# Patient Record
Sex: Female | Born: 1975 | Race: Black or African American | Hispanic: No | Marital: Single | Smoking: Never smoker
Health system: Southern US, Community
[De-identification: ages and names within clinical notes are randomized; demographics above are authoritative.]

## PROBLEM LIST (undated history)

## (undated) DIAGNOSIS — K5909 Other constipation: Secondary | ICD-10-CM

## (undated) DIAGNOSIS — R3915 Urgency of urination: Secondary | ICD-10-CM

## (undated) DIAGNOSIS — F419 Anxiety disorder, unspecified: Secondary | ICD-10-CM

## (undated) DIAGNOSIS — F329 Major depressive disorder, single episode, unspecified: Secondary | ICD-10-CM

## (undated) DIAGNOSIS — Z973 Presence of spectacles and contact lenses: Secondary | ICD-10-CM

## (undated) DIAGNOSIS — Z8679 Personal history of other diseases of the circulatory system: Secondary | ICD-10-CM

## (undated) DIAGNOSIS — G43009 Migraine without aura, not intractable, without status migrainosus: Secondary | ICD-10-CM

## (undated) DIAGNOSIS — F319 Bipolar disorder, unspecified: Secondary | ICD-10-CM

## (undated) DIAGNOSIS — R51 Headache: Secondary | ICD-10-CM

## (undated) DIAGNOSIS — F32A Depression, unspecified: Secondary | ICD-10-CM

## (undated) DIAGNOSIS — K449 Diaphragmatic hernia without obstruction or gangrene: Secondary | ICD-10-CM

## (undated) DIAGNOSIS — F410 Panic disorder [episodic paroxysmal anxiety] without agoraphobia: Secondary | ICD-10-CM

## (undated) DIAGNOSIS — F411 Generalized anxiety disorder: Secondary | ICD-10-CM

## (undated) DIAGNOSIS — N871 Moderate cervical dysplasia: Secondary | ICD-10-CM

## (undated) DIAGNOSIS — R519 Headache, unspecified: Secondary | ICD-10-CM

## (undated) DIAGNOSIS — K219 Gastro-esophageal reflux disease without esophagitis: Secondary | ICD-10-CM

## (undated) DIAGNOSIS — D649 Anemia, unspecified: Secondary | ICD-10-CM

## (undated) DIAGNOSIS — Z862 Personal history of diseases of the blood and blood-forming organs and certain disorders involving the immune mechanism: Secondary | ICD-10-CM

## (undated) HISTORY — PX: CYSTOSCOPY WITH URETHRAL DILATATION: SHX5125

## (undated) HISTORY — DX: Gastro-esophageal reflux disease without esophagitis: K21.9

## (undated) HISTORY — PX: OTHER SURGICAL HISTORY: SHX169

## (undated) HISTORY — PX: CHOLECYSTECTOMY: SHX55

---

## 2000-06-04 HISTORY — PX: LAPAROSCOPIC CHOLECYSTECTOMY: SUR755

## 2000-10-23 ENCOUNTER — Ambulatory Visit (HOSPITAL_COMMUNITY): Admission: RE | Admit: 2000-10-23 | Discharge: 2000-10-23 | Payer: Self-pay | Admitting: Internal Medicine

## 2000-10-23 ENCOUNTER — Encounter: Payer: Self-pay | Admitting: Internal Medicine

## 2000-10-29 ENCOUNTER — Observation Stay (HOSPITAL_COMMUNITY): Admission: RE | Admit: 2000-10-29 | Discharge: 2000-10-30 | Payer: Self-pay | Admitting: Internal Medicine

## 2002-05-23 ENCOUNTER — Encounter: Payer: Self-pay | Admitting: Emergency Medicine

## 2002-05-23 ENCOUNTER — Emergency Department (HOSPITAL_COMMUNITY): Admission: EM | Admit: 2002-05-23 | Discharge: 2002-05-23 | Payer: Self-pay | Admitting: Emergency Medicine

## 2002-06-01 ENCOUNTER — Ambulatory Visit (HOSPITAL_COMMUNITY): Admission: RE | Admit: 2002-06-01 | Discharge: 2002-06-01 | Payer: Self-pay | Admitting: Family Medicine

## 2002-12-10 ENCOUNTER — Ambulatory Visit (HOSPITAL_COMMUNITY): Admission: AD | Admit: 2002-12-10 | Discharge: 2002-12-10 | Payer: Self-pay | Admitting: Obstetrics and Gynecology

## 2002-12-14 ENCOUNTER — Ambulatory Visit (HOSPITAL_COMMUNITY): Admission: AD | Admit: 2002-12-14 | Discharge: 2002-12-14 | Payer: Self-pay | Admitting: Obstetrics & Gynecology

## 2002-12-22 ENCOUNTER — Ambulatory Visit (HOSPITAL_COMMUNITY): Admission: AD | Admit: 2002-12-22 | Discharge: 2002-12-22 | Payer: Self-pay | Admitting: Obstetrics & Gynecology

## 2003-01-28 ENCOUNTER — Inpatient Hospital Stay (HOSPITAL_COMMUNITY): Admission: AD | Admit: 2003-01-28 | Discharge: 2003-01-30 | Payer: Self-pay | Admitting: Obstetrics & Gynecology

## 2003-02-12 ENCOUNTER — Ambulatory Visit (HOSPITAL_COMMUNITY): Admission: RE | Admit: 2003-02-12 | Discharge: 2003-02-12 | Payer: Self-pay | Admitting: Obstetrics & Gynecology

## 2003-02-12 ENCOUNTER — Encounter: Payer: Self-pay | Admitting: Obstetrics & Gynecology

## 2003-12-06 ENCOUNTER — Emergency Department (HOSPITAL_COMMUNITY): Admission: EM | Admit: 2003-12-06 | Discharge: 2003-12-06 | Payer: Self-pay | Admitting: Emergency Medicine

## 2004-06-04 HISTORY — PX: ERCP: SHX60

## 2004-08-11 ENCOUNTER — Ambulatory Visit (HOSPITAL_COMMUNITY): Admission: RE | Admit: 2004-08-11 | Discharge: 2004-08-11 | Payer: Self-pay | Admitting: Family Medicine

## 2004-09-25 ENCOUNTER — Ambulatory Visit (HOSPITAL_COMMUNITY): Admission: RE | Admit: 2004-09-25 | Discharge: 2004-09-25 | Payer: Self-pay | Admitting: Family Medicine

## 2004-10-13 ENCOUNTER — Emergency Department (HOSPITAL_COMMUNITY): Admission: EM | Admit: 2004-10-13 | Discharge: 2004-10-13 | Payer: Self-pay | Admitting: Emergency Medicine

## 2005-04-06 ENCOUNTER — Ambulatory Visit (HOSPITAL_COMMUNITY): Admission: RE | Admit: 2005-04-06 | Discharge: 2005-04-06 | Payer: Self-pay | Admitting: Family Medicine

## 2005-04-22 ENCOUNTER — Emergency Department (HOSPITAL_COMMUNITY): Admission: EM | Admit: 2005-04-22 | Discharge: 2005-04-22 | Payer: Self-pay | Admitting: Emergency Medicine

## 2005-05-21 ENCOUNTER — Ambulatory Visit: Payer: Self-pay | Admitting: Internal Medicine

## 2005-05-24 ENCOUNTER — Ambulatory Visit (HOSPITAL_COMMUNITY): Admission: RE | Admit: 2005-05-24 | Discharge: 2005-05-24 | Payer: Self-pay | Admitting: Internal Medicine

## 2005-07-16 ENCOUNTER — Ambulatory Visit: Payer: Self-pay | Admitting: Internal Medicine

## 2005-09-17 ENCOUNTER — Emergency Department (HOSPITAL_COMMUNITY): Admission: EM | Admit: 2005-09-17 | Discharge: 2005-09-17 | Payer: Self-pay | Admitting: Emergency Medicine

## 2005-10-08 ENCOUNTER — Ambulatory Visit: Payer: Self-pay | Admitting: Internal Medicine

## 2005-12-13 ENCOUNTER — Ambulatory Visit: Payer: Self-pay | Admitting: Gastroenterology

## 2006-01-17 ENCOUNTER — Ambulatory Visit (HOSPITAL_COMMUNITY): Admission: RE | Admit: 2006-01-17 | Discharge: 2006-01-17 | Payer: Self-pay | Admitting: Family Medicine

## 2008-04-17 ENCOUNTER — Ambulatory Visit (HOSPITAL_COMMUNITY): Admission: RE | Admit: 2008-04-17 | Discharge: 2008-04-17 | Payer: Self-pay | Admitting: Family Medicine

## 2008-07-19 ENCOUNTER — Other Ambulatory Visit: Admission: RE | Admit: 2008-07-19 | Discharge: 2008-07-19 | Payer: Self-pay | Admitting: Obstetrics and Gynecology

## 2008-07-29 ENCOUNTER — Ambulatory Visit (HOSPITAL_COMMUNITY): Admission: RE | Admit: 2008-07-29 | Discharge: 2008-07-29 | Payer: Self-pay | Admitting: Family Medicine

## 2010-04-11 ENCOUNTER — Ambulatory Visit (HOSPITAL_COMMUNITY)
Admission: RE | Admit: 2010-04-11 | Discharge: 2010-04-11 | Payer: Self-pay | Source: Home / Self Care | Admitting: Gastroenterology

## 2010-04-20 ENCOUNTER — Encounter (HOSPITAL_COMMUNITY)
Admission: RE | Admit: 2010-04-20 | Discharge: 2010-05-20 | Payer: Self-pay | Source: Home / Self Care | Attending: Neurology | Admitting: Neurology

## 2010-08-03 ENCOUNTER — Other Ambulatory Visit: Payer: Self-pay | Admitting: Urology

## 2010-08-03 ENCOUNTER — Encounter (HOSPITAL_COMMUNITY): Payer: Self-pay | Attending: Urology

## 2010-08-03 DIAGNOSIS — Z01812 Encounter for preprocedural laboratory examination: Secondary | ICD-10-CM | POA: Insufficient documentation

## 2010-08-03 DIAGNOSIS — Z01810 Encounter for preprocedural cardiovascular examination: Secondary | ICD-10-CM | POA: Insufficient documentation

## 2010-08-03 LAB — BASIC METABOLIC PANEL WITH GFR
BUN: 6 mg/dL (ref 6–23)
CO2: 28 meq/L (ref 19–32)
Calcium: 9.2 mg/dL (ref 8.4–10.5)
Chloride: 101 meq/L (ref 96–112)
Creatinine, Ser: 0.64 mg/dL (ref 0.4–1.2)
GFR calc non Af Amer: >60 mL/min
Glucose, Bld: 91 mg/dL (ref 70–99)
Potassium: 4 meq/L (ref 3.5–5.1)
Sodium: 137 meq/L (ref 135–145)

## 2010-08-03 LAB — HEMOGLOBIN AND HEMATOCRIT, BLOOD
HCT: 40.2 % (ref 36.0–46.0)
Hemoglobin: 13.2 g/dL (ref 12.0–15.0)

## 2010-08-03 LAB — HCG, QUANTITATIVE, PREGNANCY

## 2010-08-03 LAB — SURGICAL PCR SCREEN
MRSA, PCR: NEGATIVE
Staphylococcus aureus: POSITIVE — AB

## 2010-08-08 ENCOUNTER — Ambulatory Visit (HOSPITAL_COMMUNITY)
Admission: RE | Admit: 2010-08-08 | Discharge: 2010-08-08 | Disposition: A | Discharge status: Home / Self Care | Payer: Medicaid Other | Source: Ambulatory Visit | Attending: Urology | Admitting: Urology

## 2010-08-08 DIAGNOSIS — N393 Stress incontinence (female) (male): Secondary | ICD-10-CM | POA: Insufficient documentation

## 2010-08-08 HISTORY — PX: URETHRAL SLING: SHX2621

## 2010-08-11 NOTE — Op Note (Signed)
NAME:  Shannon Gould, Shannon Gould NO.:  [id number]  MEDICAL RECORD NO.:  [id number]           PATIENT TYPE:  O  LOCATION:  DAYP                          FACILITY:  [facility]  PHYSICIAN:  Ky Barban, M.D.DATE OF BIRTH:  [birth year]  DATE OF PROCEDURE: DATE OF DISCHARGE:                              OPERATIVE REPORT   PREOPERATIVE DIAGNOSIS:  Stress urinary incontinence.  POSTOPERATIVE DIAGNOSIS:  Stress urinary incontinence.  PROCEDURE:  Tension-free vaginal tape.  ANESTHESIA:  General.  PROCEDURE:  The patient under general anesthesia in lithotomy position, after usual prep and drape #20 Foley catheter was reintroduced into the bladder, suprapubic area was marked at the level of the pubic tubercles on both sides of the midline.  Now, a vaginal speculum Sims was applied and the mid urethra and periurethral area was infiltrated with 10 mL of normal saline, and incision about 1 cm long on the midline right over the urethra was made in the middle between the bladder neck and the meatus and with scissor, I was able to develop periurethral area just enough to accept the tip of the needle on both sides of the urethra. Once this was done, then Foley catheter was removed and a catheter with a stylet was introduced into the bladder, deflected the bladder neck to the right side, and introduced a needle through the vaginal incision into the periurethral site in the left side and felt the back of the pubic tubercle, I introduced the needle pushed it all the way up into the suprapubic area in the retropubic space coming out at the previously marked site.  Now, the needle was left in place and the was catheter was removed.  Bladder was inspected with the right angle lens to make sure the needle was outside the bladder.  It was outside then the needle was pulled up into the suprapubic area along with the tip of the tape along with the back of the needle was attached to the tape.   After removing the handle and the tape and was pulled up into the suprapubic area. Similarly, left end of the tape was pulled up into the suprapubic area. Now, the tape is in place.  I have inspected again the bladder with right angle lens and put about 300 mL of saline in the bladder, to make sure the tape is outside the bladder.  Once the tape was in the proper place, the tension on the table was adjusted.  I inserted #20 Foley catheter again in the bladder with my scissors between the tape and the urethra.  Both ends of the tape are pulled and the suprapubic area removed any redundancy.  The tension on the tape was adjusted, it was basically loose, and the plastic sheathing from the tape was removed. Both the plastic sheathing 1 x 1 were taken off.  The vaginal incision is irrigated with saline.  The tape was lying gently against the urethra.  The redundant part of the tape in the suprapubic area was cut flush with the skin.  The vaginal incision was closed with a couple of stitches of 3-0 Vicryl.  Foley  catheter was removed.  The suprapubic incisions were simply closed with Dermabond. The patient left the operating room in satisfactory condition.     Ky Barban, M.D.     MIJ/MEDQ  D:  08/08/2010  T:  08/09/2010  Job:  [id number]  Electronically Signed by Alleen Borne M.D. on 08/10/2010 02:15:18 PM

## 2010-08-11 NOTE — H&P (Signed)
  NAMEJULIETH, Shannon Gould NO.:  [id number]  MEDICAL RECORD NO.:  [id number]           PATIENT TYPE:  LOCATION:                                 FACILITY:  PHYSICIAN:  Ky Barban, M.D.DATE OF BIRTH:  May 05, 1976  DATE OF ADMISSION: DATE OF DISCHARGE:  LH                             HISTORY & PHYSICAL   CHIEF COMPLAINT:  Stress urinary incontinence.  HISTORY:  This 35 year old female is being followed by me since November 2011 after she was referred from the [facility].  She has stress urinary incontinence.  Further workup, cysto/CMG showed that she has marked stress incontinence which goes away with the elevation of the bladder neck, so I have recommended that she undergo tension-free vaginal tape, for which she is coming as an outpatient in the morning, will undergo the procedure.  I have discussed in detail the nature of the procedure.  Possible complications include prolonged retention and it may lead to removal of the tape which means that she will land up having incontinence again.  It will be done as outpatient.  I have explained this thoroughly to her.  She understands and wants me to go ahead and schedule it.     Ky Barban, M.D.     MIJ/MEDQ  D:  08/07/2010  T:  08/08/2010  Job:  [id number]  Electronically Signed by Alleen Borne M.D. on 08/10/2010 02:15:15 PM

## 2010-10-20 NOTE — Procedures (Signed)
   NAME:  [name], Shannon Gould NO.:  [id number]   MEDICAL RECORD NO.:  [id number]                   PATIENT TYPE:  EMS   LOCATION:  ED                                   FACILITY:  [facility]   PHYSICIAN:  Edward L. Juanetta Gosling, M.D.             DATE OF BIRTH:  Jun 11, 1975   DATE OF PROCEDURE:  DATE OF DISCHARGE:  05/23/2002                                EKG INTERPRETATION   TIME AND DATE:  0117 on 05/23/02   INTERPRETATION:  The rhythm is sinus rhythm with a rate of 80.   IMPRESSION:  Normal electrocardiogram.                                               Edward L. Juanetta Gosling, M.D.    ELH/MEDQ  D:  05/25/2002  T:  05/26/2002  Job:  [id number]

## 2010-10-20 NOTE — H&P (Signed)
   NAME:  Shannon Gould, HUKILL NO.:  [id number]   MEDICAL RECORD NO.:  [id number]                   PATIENT TYPE:  INP   LOCATION:  LDR1                                 FACILITY:  [facility]   PHYSICIAN:  Tilda Burrow, M.D.              DATE OF BIRTH:  May 22, 1976   DATE OF ADMISSION:  01/28/2003  DATE OF DISCHARGE:                                HISTORY & PHYSICAL   CHIEF COMPLAINT:  Contractions since 0500 hours.   HISTORY OF PRESENT ILLNESS:  Shannon Gould is a 35 year old gravida 4, para 2,  with an EDC of February 11, 2003 based on a first and second trimester  ultrasound placing her at [redacted] weeks gestation.  Pregnancy has been  complicated by some preterm contractions since approximately 30 weeks for  which she has taken Brethine and had an unresponsive cervix.   PRENATAL LABORATORIES INCLUDE:  Blood type O positive.  Rubella is immune.  RPR/HIV/HBsAg/gonorrhea/Chlamydia/GBS were all negative.  One-hour GTT was  normal at 43.  She did have a positive antibody screen with an anti-M titer  of 1:1.  Blood pressures have been 110s-130s/60s-80s.  Total weight gain  about 25 pounds with appropriate fundal height growth.   PAST MEDICAL HISTORY:  Positive for claustrophobia and anxiety attacks.   PAST SURGICAL HISTORY:  Cholecystectomy in April 2002.  No history of blood  transfusions or anesthesia complications.   ALLERGIES:  HYDROCODONE.   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   FAMILY HISTORY:  Positive for cancer, asthma, coronary artery disease, and  hypertension.   OBSTETRICAL HISTORY:  Had an SAB in 1999, spontaneous vaginal delivery of a  7-pound 4-ounce female in 2000 at 40 weeks and a spontaneous vaginal delivery  of a 6-pound 15-ounce female in 2003 at 33 weeks, she did have a 3-hour labor  with that baby.   PHYSICAL EXAMINATION:  HEENT:  Within normal limits.  HEART:  Regular rate and rhythm.  LUNGS:  Clear.  ABDOMEN:  Soft, nontender, having strong contractions now every few minutes,  fetal heart rate is within normal limits.  PELVIC:  Exam upon arrival to the unit was 2, 90, -1.  She is now 5, 100%, -  1 with bulging membranes.  LEGS:  Negative.   IMPRESSION:  1. Intrauterine pregnancy at 38 weeks.  2. Active labor.   PLAN:  The patient desires epidural anesthesia at this time.  Dr. Emelda Fear  is aware.  Anticipate spontaneous vaginal delivery.     Jacklyn Shell, C.N.M.          Tilda Burrow, M.D.    FC/MEDQ  D:  01/28/2003  T:  01/28/2003  Job:  [id number]   cc:   [facility]

## 2010-10-20 NOTE — Group Therapy Note (Signed)
Shannon Gould, Shannon Gould NO.:  [id number]   MEDICAL RECORD NO.:  [id number]          PATIENT TYPE:  EMS   LOCATION:  ED                            FACILITY:  [facility]   PHYSICIAN:  Kassie Mends, M.D.      DATE OF BIRTH:  1976/02/16   DATE OF PROCEDURE:  12/13/2005  DATE OF DISCHARGE:  09/17/2005                                   PROGRESS NOTE   CHIEF COMPLAINT:  Right upper quadrant abdominal pain.   SUBJECTIVE:  Shannon Gould is a 35 year old female who has a significant past  medical history of persistent right upper quadrant pain.  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  Biliary sphincterotomy was performed.  She  has had a normal ultrasound and MRCP in 2006.  She reports that walking at  the mall causes the right upper quadrant pain to occur after the fourth lap  around the mall.  She desires to do 6 laps.  The pain is sharp, always in  the same place, and does not radiate.  She did have 1 episode of nausea on  Sunday, which she treated with Phenergan and it resolved.  She had  constipation a couple of weeks which resolved with a laxative.  She denies  any vomiting, fever, jaundice, or difficulty swallowing.  It is a 2 out of  10 today.  She does not feel the pain on a daily basis.  She does regularly  pick up, pull, and push patients.  This pain is different than in March  2007.   MEDICATIONS:  1.  Allegra.  2.  Tylenol 1-2 per week, which helps with the pain.  3.  Pamine Forte if she has pain but it does not help.  4.  She has been off Zegerid for at least 2 weeks, and it did not help with      the pain either.   OBJECTIVE:  VITAL SIGNS:  Weight 178 pounds, height 5 feet 4 inches.  Body  mass index 30.6.  Blood pressure 122/70, pulse 62, temperature 98.3.GENERAL:  She is in no apparent distress, alert and oriented x 4.LUNGS:  Clear to  auscultation bilaterally.  CARDIOVASCULAR  EXAM:  Regular rhythm.  No murmur.  Normal S1 and S2.ABDOMEN:  Bowel sounds are present.  Soft, nontender, and nondistended.  No rebound or  guarding.  No reproducible right upper quadrant pain with Carnett's  maneuvers.   REVIEW OF SYSTEMS:  Last menstrual period last week.   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  Thank you for  allowing me to see Shannon Gould in consultation.  I recommend ibuprofen 400 mg  every 8 hours for 10 days.  She may repeat this for another 10 days if the  pain is not completely resolved.  She is to use Tylenol and only Tylenol for  pain relief.  She is to call me in 3 weeks to let me know  how she is doing.  She is  given 20 Protonix to take with the ibuprofen for GI prophylaxis.  She is  asked to use heat 3 times a day on her right upper quadrant to assist with  healing.  She may follow up in 3 months.  If she does not have significant  relief of her pain after 1 course of ibuprofen, she may need to be on work  restriction.      Kassie Mends, M.D.  Electronically Signed     SM/MEDQ  D:  12/13/2005  T:  12/13/2005  Job:  [id number]   cc:   Corrie Mckusick, M.D.  Fax: [phone]

## 2010-10-20 NOTE — Op Note (Signed)
   NAME:  Shannon Gould, Shannon Gould NO.:  [id number]   MEDICAL RECORD NO.:  [id number]                   PATIENT TYPE:  INP   LOCATION:  LDR1                                 FACILITY:  [facility]   PHYSICIAN:  Tilda Burrow, M.D.              DATE OF BIRTH:  [birth year]   DATE OF PROCEDURE:  01/28/2003  DATE OF DISCHARGE:                                 OPERATIVE REPORT   PROCEDURE:  Epidural catheter placement at 12:30 p.m. on August 26.   SURGEON:  Tilda Burrow, M.D.   DESCRIPTION OF PROCEDURE:  Continuous lumbar epidural catheter placement  using loss of resistance technique performed at L2-3 interspace on the first  attempt; at a depth of approximately 5-6 cm beneath the skin.  A test dose  of 1.5% Xylocaine with epinephrine was instilled followed by introduction of  the epidural catheter; aspiration confirmation of no bloody return.  This  was followed by removal of the Touhy needle taping to the back and  attachment to the continuous infusion to run at 12 cc/h.  A 5 cc bolus of  the 0.125% Marcaine solution was administered and the patient showed  satisfactory analgesic relief.  The dermatome level was still setting up at  this time.  Vaginal exam shows the cervix to be 9 cm dilated with huge  bulging forewaters which were broken to reveal clear fluid.  The patient is  tolerating labor dramatically better.                                               Tilda Burrow, M.D.    JVF/MEDQ  D:  01/28/2003  T:  01/28/2003  Job:  [id number]

## 2010-10-20 NOTE — Op Note (Signed)
   NAME:  Shannon Gould, Shannon Gould NO.:  [id number]   MEDICAL RECORD NO.:  [id number]                   PATIENT TYPE:  INP   LOCATION:  LDR1                                 FACILITY:  [facility]   PHYSICIAN:  Jacklyn Shell, C.N.M.    DATE OF BIRTH:  [birth year]   DATE OF PROCEDURE:  DATE OF DISCHARGE:                                 OPERATIVE REPORT   DELIVERY NOTE   Shannon Gould received her epidural.  Membranes were ruptured by Dr. Emelda Fear and  she was noted to be 9 cm at approximately 1240.  Shortly after that she  complained of some perineal pressure with contractions and was noted to be  fully dilated at +2 station.  Shannon Gould had a 2-contraction second stage and  delivered a viable female infant at 17.  The mouth and nose were suctioned  don the perineum and the body delivered easily after that.  Apgars are 7 and  9.  Weight 7 pounds 2 ounces.   Twenty units of Pitocin diluted in 1000 cc of lactated Ringers was then  infused rapidly IV.  The placenta separated spontaneously and was delivered  by a controlled cord traction at 1315.  It was inspected and appears to be  intact with a 3-vessel cord.  The fundus was firm and hardly any blood loss  was noted.  The vagina was then inspected and no lacerations were found.  Estimated blood loss 250 cc. The epidural catheter was then removed with the  blue tip visualized as being intact.                                               Jacklyn Shell, C.N.M.    FC/MEDQ  D:  01/28/2003  T:  01/28/2003  Job:  [id number]   cc:   [facility]

## 2010-10-20 NOTE — Procedures (Signed)
   NAME:  Shannon Gould, ADCOX NO.:  [id number]   MEDICAL RECORD NO.:  [id number]                   PATIENT TYPE:  OUT   LOCATION:  RAD                                  FACILITY:  [facility]   PHYSICIAN:  Kennedale Bing, M.D. [facility]           DATE OF BIRTH:  1975-07-05   DATE OF PROCEDURE:  06/01/2002                  AGE:  35  DATE OF DISCHARGE:  06/01/2002                  SEX:  F                                CARDIAC ULTRASOUND   STRESS CARDIAC ULTRASOUND:   CLINICAL INFORMATION:  A 35 year old woman with chest pain.   FINDINGS:  1. An upright treadmill exercise performed to a workload of 10 METS and a     heart rate of 181, 92% of age-predicted maximum.  Exercise discontinued     due to fatigue. No chest pain reported  2. Blood pressure increased where resting values were 185/80 to 165/105 at     peak exercise, a mildly hypertensive response.  3. No arrhythmias noted.  4. EKG: Normal sinus rhythm; nondiagnostic inferior T wave; nonspecific T     wave abnormality.  5. Exercise EKG: No significant change.  6. Baseline echocardiographic images demonstrate normal chamber dimensions.     No significant valvular abnormalities and normal left ventricular size     and function.  7. Post-exercise imaging: Normal increase in contractility in all myocardial     segments.   IMPRESSION:  Negative stress echocardiogram with normal exercise tolerance,  a mildly hypertensive response to exertion and neither electrocardiographic  nor echocardiographic for myocardial ischemia or infarction.  Other findings  as noted.                                               Virgie Bing, M.D. [facility]    RR/MEDQ  D:  06/02/2002  T:  06/03/2002  Job:  [id number]

## 2010-12-25 ENCOUNTER — Emergency Department (HOSPITAL_COMMUNITY)
Admission: EM | Admit: 2010-12-25 | Discharge: 2010-12-26 | Disposition: A | Discharge status: Home / Self Care | Payer: Self-pay | Attending: Emergency Medicine | Admitting: Emergency Medicine

## 2010-12-25 ENCOUNTER — Encounter: Payer: Self-pay | Admitting: *Deleted

## 2010-12-25 DIAGNOSIS — R51 Headache: Secondary | ICD-10-CM | POA: Insufficient documentation

## 2010-12-25 DIAGNOSIS — J45909 Unspecified asthma, uncomplicated: Secondary | ICD-10-CM | POA: Insufficient documentation

## 2010-12-25 MED ORDER — SODIUM CHLORIDE 0.9 % IV BOLUS (SEPSIS)
1000.0000 mL | Freq: Once | INTRAVENOUS | Status: AC
  Administered 2010-12-25: 1000 mL via INTRAVENOUS

## 2010-12-25 MED ORDER — DIPHENHYDRAMINE HCL 50 MG/ML IJ SOLN
25.0000 mg | Freq: Three times a day (TID) | INTRAMUSCULAR | Status: DC | PRN
  Administered 2010-12-25: 25 mg via INTRAVENOUS
  Filled 2010-12-25: qty 1

## 2010-12-25 NOTE — ED Notes (Signed)
Pt c/o migraine and nausea that started 2 days ago and has gotten progressively worse.

## 2010-12-25 NOTE — ED Notes (Signed)
Headache for 2 days.  Alert, no hx of trauma.  Alert, talking, No relief with home meds.

## 2010-12-26 MED ORDER — METOCLOPRAMIDE HCL 5 MG/ML IJ SOLN
INTRAMUSCULAR | Status: AC
  Administered 2010-12-26: 01:00:00
  Filled 2010-12-26: qty 2

## 2010-12-26 MED ORDER — METOCLOPRAMIDE HCL 5 MG/ML IJ SOLN: 10.0000 mg | Freq: Once | INTRAMUSCULAR | Status: DC

## 2010-12-26 MED ORDER — KETOROLAC TROMETHAMINE 30 MG/ML IJ SOLN
INTRAMUSCULAR | Status: AC
  Filled 2010-12-26: qty 1

## 2010-12-26 MED ORDER — KETOROLAC TROMETHAMINE 30 MG/ML IJ SOLN
30.0000 mg | Freq: Once | INTRAMUSCULAR | Status: AC
  Administered 2010-12-26: 30 mg via INTRAVENOUS

## 2010-12-26 MED ORDER — IBUPROFEN 800 MG PO TABS: 800.0000 mg | ORAL_TABLET | Freq: Three times a day (TID) | ORAL | Status: AC

## 2010-12-26 NOTE — ED Provider Notes (Signed)
History     Chief Complaint  Patient presents with  . Migraine  . Nausea   Patient is a 35 y.o. female presenting with headaches. The history is provided by the patient.  Headache  This is a new problem. The current episode started yesterday. The problem occurs constantly. The problem has not changed since onset.The headache is associated with bright light. The pain is located in the right unilateral region. The quality of the pain is described as throbbing. The pain is moderate. The pain does not radiate. Pertinent negatives include no anorexia, no fever, no malaise/fatigue, no chest pressure, no near-syncope, no orthopnea, no syncope, no shortness of breath and no nausea. She has tried NSAIDs for the symptoms.   Has h/o HAs and has not been getting good sleep recently. No weakness or numbness, no neck stiffness, no rash or recent tick exposure.  Past Medical History  Diagnosis Date  . Asthma     Past Surgical History  Procedure Date  . Cholecystectomy   . Transvaginal mesh     Family History  Problem Relation Age of Onset  . Hypertension Mother   . Diabetes Mother     History  Substance Use Topics  . Smoking status: Never Smoker   . Smokeless tobacco: Not on file  . Alcohol Use: No    OB History    Grav Para Term Preterm Abortions TAB SAB Ect Mult Living                  Review of Systems  Constitutional: Negative for fever, chills and malaise/fatigue.  HENT: Negative for neck pain and neck stiffness.   Eyes: Negative for pain.  Respiratory: Negative for shortness of breath.   Cardiovascular: Negative for chest pain, orthopnea, leg swelling, syncope and near-syncope.  Gastrointestinal: Negative for nausea, abdominal pain and anorexia.  Genitourinary: Negative for dysuria.  Musculoskeletal: Negative for back pain.  Skin: Negative for rash.  Neurological: Positive for headaches.  Psychiatric/Behavioral: Negative for hallucinations and confusion.  All other  systems reviewed and are negative.    Physical Exam  BP 108/52  Pulse 88  Temp(Src) 98.8 F (37.1 C) (Oral)  Resp 16  Ht 5\' 4"  (1.626 m)  Wt 225 lb (102.059 kg)  BMI 38.62 kg/m2  SpO2 100%  LMP 12/17/2010  Physical Exam  Constitutional: She is oriented to person, place, and time. She appears well-developed and well-nourished.  HENT:  Head: Normocephalic and atraumatic.  Eyes: Conjunctivae and EOM are normal. Pupils are equal, round, and reactive to light.  Neck: Full passive range of motion without pain. Neck supple. No thyromegaly present.       No meningismus  Cardiovascular: Normal rate, regular rhythm, S1 normal, S2 normal and intact distal pulses.   Pulmonary/Chest: Effort normal and breath sounds normal.  Abdominal: Soft. Bowel sounds are normal. There is no tenderness. There is no CVA tenderness.  Musculoskeletal: Normal range of motion.  Neurological: She is alert and oriented to person, place, and time. She has normal strength and normal reflexes. No cranial nerve deficit or sensory deficit. She displays a negative Romberg sign. GCS eye subscore is 4. GCS verbal subscore is 5. GCS motor subscore is 6.       Normal Gait  Skin: Skin is warm and dry. No rash noted. No cyanosis. Nails show no clubbing.  Psychiatric: She has a normal mood and affect. Her speech is normal and behavior is normal.    ED Course  Procedures PT  given IVFs and HA cocktail. No meningismus or presentation to suggest elevated ICP or SAH.  MDM 1:26 AM recheck: pain improved, remains normal neuro exam, stable for d/c home.       Sunnie Nielsen, MD 12/26/10 [phone]

## 2011-09-17 ENCOUNTER — Other Ambulatory Visit: Payer: Self-pay | Admitting: Adult Health

## 2011-09-17 ENCOUNTER — Other Ambulatory Visit (HOSPITAL_COMMUNITY)
Admission: RE | Admit: 2011-09-17 | Discharge: 2011-09-17 | Disposition: A | Discharge status: Home / Self Care | Payer: Self-pay | Source: Ambulatory Visit | Attending: Obstetrics and Gynecology | Admitting: Obstetrics and Gynecology

## 2011-09-17 DIAGNOSIS — Z01419 Encounter for gynecological examination (general) (routine) without abnormal findings: Secondary | ICD-10-CM | POA: Insufficient documentation

## 2011-09-17 DIAGNOSIS — Z1159 Encounter for screening for other viral diseases: Secondary | ICD-10-CM | POA: Insufficient documentation

## 2014-07-30 ENCOUNTER — Other Ambulatory Visit: Payer: Self-pay

## 2014-08-02 LAB — CYTOLOGY - PAP

## 2014-08-05 ENCOUNTER — Emergency Department (HOSPITAL_COMMUNITY)
Admission: EM | Admit: 2014-08-05 | Discharge: 2014-08-05 | Disposition: A | Discharge status: Home / Self Care | Payer: 59 | Attending: Emergency Medicine | Admitting: Emergency Medicine

## 2014-08-05 ENCOUNTER — Encounter (HOSPITAL_COMMUNITY): Payer: Self-pay | Admitting: Cardiology

## 2014-08-05 ENCOUNTER — Emergency Department (HOSPITAL_COMMUNITY): Payer: 59

## 2014-08-05 DIAGNOSIS — J45909 Unspecified asthma, uncomplicated: Secondary | ICD-10-CM | POA: Insufficient documentation

## 2014-08-05 DIAGNOSIS — R079 Chest pain, unspecified: Secondary | ICD-10-CM | POA: Diagnosis not present

## 2014-08-05 DIAGNOSIS — Z79899 Other long term (current) drug therapy: Secondary | ICD-10-CM | POA: Diagnosis not present

## 2014-08-05 DIAGNOSIS — F329 Major depressive disorder, single episode, unspecified: Secondary | ICD-10-CM | POA: Diagnosis not present

## 2014-08-05 HISTORY — DX: Depression, unspecified: F32.A

## 2014-08-05 HISTORY — DX: Major depressive disorder, single episode, unspecified: F32.9

## 2014-08-05 LAB — COMPREHENSIVE METABOLIC PANEL
ALBUMIN: 4.1 g/dL (ref 3.5–5.2)
ALT: 11 U/L (ref 0–35)
AST: 13 U/L (ref 0–37)
Alkaline Phosphatase: 45 U/L (ref 39–117)
Anion gap: 5 (ref 5–15)
BILIRUBIN TOTAL: 0.7 mg/dL (ref 0.3–1.2)
BUN: 15 mg/dL (ref 6–23)
CHLORIDE: 109 mmol/L (ref 96–112)
CO2: 24 mmol/L (ref 19–32)
CREATININE: 0.71 mg/dL (ref 0.50–1.10)
Calcium: 8.6 mg/dL (ref 8.4–10.5)
GFR calc Af Amer: >90 mL/min (ref >90)
Glucose, Bld: 100 mg/dL — ABNORMAL HIGH (ref 70–99)
POTASSIUM: 4.1 mmol/L (ref 3.5–5.1)
SODIUM: 138 mmol/L (ref 135–145)
Total Protein: 7.2 g/dL (ref 6.0–8.3)

## 2014-08-05 LAB — CBC WITH DIFFERENTIAL/PLATELET
Basophils Absolute: 0 10*3/uL (ref 0.0–0.1)
Basophils Relative: 0 % (ref 0–1)
Eosinophils Absolute: 0.2 10*3/uL (ref 0.0–0.7)
Eosinophils Relative: 2 % (ref 0–5)
HEMATOCRIT: 39.7 % (ref 36.0–46.0)
HEMOGLOBIN: 13.1 g/dL (ref 12.0–15.0)
LYMPHS ABS: 2.1 10*3/uL (ref 0.7–4.0)
Lymphocytes Relative: 27 % (ref 12–46)
MCH: 27.9 pg (ref 26.0–34.0)
MCHC: 33 g/dL (ref 30.0–36.0)
MCV: 84.5 fL (ref 78.0–100.0)
MONO ABS: 0.6 10*3/uL (ref 0.1–1.0)
MONOS PCT: 8 % (ref 3–12)
NEUTROS ABS: 4.8 10*3/uL (ref 1.7–7.7)
NEUTROS PCT: 63 % (ref 43–77)
Platelets: 216 10*3/uL (ref 150–400)
RBC: 4.7 MIL/uL (ref 3.87–5.11)
RDW: 12.3 % (ref 11.5–15.5)
WBC: 7.7 10*3/uL (ref 4.0–10.5)

## 2014-08-05 LAB — TROPONIN I

## 2014-08-05 NOTE — ED Notes (Signed)
Chest pressure and feeling palpitations times 2 days.

## 2014-08-05 NOTE — ED Notes (Signed)
Patient is resting comfortably. 

## 2014-08-05 NOTE — ED Provider Notes (Signed)
CSN: [id number]     Arrival date & time 08/05/14  0724 History  This chart was scribed for Shannon Hutching, MD by Abel Presto, ED Scribe. This patient was seen in room APA07/APA07 and the patient's care was started at 7:52 AM.    Chief Complaint  Patient presents with  . Chest Pain    Patient is a 39 y.o. female presenting with chest pain. The history is provided by the patient. No language interpreter was used.  Chest Pain  HPI Comments: Shannon Gould is a 39 y.o. female who presents to the Emergency Department complaining of waxing and waning central and upper left chest pressure with onset 2 days ago. Pt notes she felt anxious 3 days ago and notes associated SOB. Pt was concerned she was having an anxiety attack. Pt has had recent episodes of palpitations. Pt was seen by PCP this morning and instructed to come to the ED. Pt is on Topomax and Flagyl. Pt is not a smoker, but notes infrequent EtOH use.  Pt denies bcp use, DOE, diaphoresis, vomiting.   Past Medical History  Diagnosis Date  . Asthma   . Depression    Past Surgical History  Procedure Laterality Date  . Cholecystectomy    . Transvaginal mesh     Family History  Problem Relation Age of Onset  . Hypertension Mother   . Diabetes Mother   . Heart disease Father    History  Substance Use Topics  . Smoking status: Never Smoker   . Smokeless tobacco: Not on file  . Alcohol Use: No   OB History    No data available     Review of Systems  Cardiovascular: Positive for chest pain.  A complete 10 system review of systems was obtained and all systems are negative except as noted in the HPI and PMH.      Allergies  Hydrocodone  Home Medications   Prior to Admission medications   Medication Sig Start Date End Date Taking? Authorizing Provider  albuterol (PROVENTIL HFA) 108 (90 BASE) MCG/ACT inhaler Inhale 2 puffs into the lungs every 6 (six) hours as needed. For shortness of breath    Yes Historical Provider, MD   albuterol (PROVENTIL) (2.5 MG/3ML) 0.083% nebulizer solution Take 2.5 mg by nebulization as needed. For wheezing     Yes Historical Provider, MD  levonorgestrel (MIRENA) 20 MCG/24HR IUD 1 each by Intrauterine route once. Inserted almost 2 years ago w/ unknown date    Yes Historical Provider, MD  metroNIDAZOLE (FLAGYL) 500 MG tablet Take 500 mg by mouth 2 (two) times daily.   Yes Historical Provider, MD  tetrahydrozoline 0.05 % ophthalmic solution Place 1 drop into the left eye daily as needed (dryness).   Yes Historical Provider, MD  topiramate (TOPAMAX) 100 MG tablet Take 50 mg by mouth daily.   Yes Historical Provider, MD   BP 114/70 mmHg  Pulse 75  Temp(Src) 98.7 F (37.1 C) (Oral)  Resp 12  Ht  (1.6 m)  Wt 189 lb (85.73 kg)  BMI 33.49 kg/m2  SpO2 100%  LMP 06/16/2014 Physical Exam  Constitutional: She is oriented to person, place, and time. She appears well-developed and well-nourished.  HENT:  Head: Normocephalic and atraumatic.  Eyes: Conjunctivae and EOM are normal. Pupils are equal, round, and reactive to light.  Neck: Normal range of motion. Neck supple.  Cardiovascular: Normal rate and regular rhythm.   Pulmonary/Chest: Effort normal and breath sounds normal.  Abdominal: Soft. Bowel  sounds are normal.  Musculoskeletal: Normal range of motion.  Neurological: She is alert and oriented to person, place, and time.  Skin: Skin is warm and dry.  Psychiatric: She has a normal mood and affect. Her behavior is normal.  Nursing note and vitals reviewed.   ED Course  Procedures (including critical care time) DIAGNOSTIC STUDIES: Oxygen Saturation is 100% on room air, normal by my interpretation.    COORDINATION OF CARE: 7:59 AM Discussed treatment plan with patient at beside, the patient agrees with the plan and has no further questions at this time.   Labs Review Labs Reviewed  COMPREHENSIVE METABOLIC PANEL - Abnormal; Notable for the following:    Glucose, Bld 100  (*)    All other components within normal limits  CBC WITH DIFFERENTIAL/PLATELET  TROPONIN I    Imaging Review Dg Chest 2 View  08/05/2014   CLINICAL DATA:  Three days and mid chest pressure, history of asthma  EXAM: CHEST  2 VIEW  COMPARISON:  PA and lateral chest of July 29, 2008  FINDINGS: The lungs are adequately inflated. There is no focal infiltrate. The interstitial markings are minimally prominent diffusely but stable. There is no pleural effusion, pneumothorax, or pneumomediastinum. The heart and pulmonary vascularity are normal. The mediastinum is normal in width. The bony thorax is unremarkable.  IMPRESSION: There is no active cardiopulmonary disease.   Electronically Signed   By: David  [place]   On: 08/05/2014 08:51     EKG Interpretation   Date/Time:  Thursday August 05 2014 07:34:42 EST Ventricular Rate:  82 PR Interval:  138 QRS Duration: 78 QT Interval:  384 QTC Calculation: 448 R Axis:   73 Text Interpretation:  Sinus rhythm Abnormal R-wave progression, early  transition Confirmed by Christal Lagerstrom  MD, Zyrah Wiswell ([id number]) on 08/05/2014 7:41:19 AM      Results for orders placed or performed during the hospital encounter of 08/05/14  Comprehensive metabolic panel  Result Value Ref Range   Sodium 138 135 - 145 mmol/L   Potassium 4.1 3.5 - 5.1 mmol/L   Chloride 109 96 - 112 mmol/L   CO2 24 19 - 32 mmol/L   Glucose, Bld 100 (H) 70 - 99 mg/dL   BUN 15 6 - 23 mg/dL   Creatinine, Ser 1.91 0.50 - 1.10 mg/dL   Calcium 8.6 8.4 - 47.8 mg/dL   Total Protein 7.2 6.0 - 8.3 g/dL   Albumin 4.1 3.5 - 5.2 g/dL   AST 13 0 - 37 U/L   ALT 11 0 - 35 U/L   Alkaline Phosphatase 45 39 - 117 U/L   Total Bilirubin 0.7 0.3 - 1.2 mg/dL   GFR calc non Af Amer >90 >90 mL/min   GFR calc Af Amer >90 >90 mL/min   Anion gap 5 5 - 15  CBC with Differential/Platelet  Result Value Ref Range   WBC 7.7 4.0 - 10.5 K/uL   RBC 4.70 3.87 - 5.11 MIL/uL   Hemoglobin 13.1 12.0 - 15.0 g/dL   HCT 29.5 62.1 - 30.8  %   MCV 84.5 78.0 - 100.0 fL   MCH 27.9 26.0 - 34.0 pg   MCHC 33.0 30.0 - 36.0 g/dL   RDW 65.7 84.6 - 96.2 %   Platelets 216 150 - 400 K/uL   Neutrophils Relative % 63 43 - 77 %   Neutro Abs 4.8 1.7 - 7.7 K/uL   Lymphocytes Relative 27 12 - 46 %   Lymphs Abs 2.1 0.7 -  4.0 K/uL   Monocytes Relative 8 3 - 12 %   Monocytes Absolute 0.6 0.1 - 1.0 K/uL   Eosinophils Relative 2 0 - 5 %   Eosinophils Absolute 0.2 0.0 - 0.7 K/uL   Basophils Relative 0 0 - 1 %   Basophils Absolute 0.0 0.0 - 0.1 K/uL  Troponin I  Result Value Ref Range   Troponin I <0.03 <0.031 ng/mL       MDM   Final diagnoses:  Chest pain    Patient is extremely low risk for ACS or pulmonary embolism. Screening tests normal. She has primary care follow-up   I personally performed the services described in this documentation, which was scribed in my presence. The recorded information has been reviewed and is accurate.     Shannon HutchingBrian Salimah Martinovich, MD 08/05/14 1105

## 2014-08-05 NOTE — Discharge Instructions (Signed)
Tests were normal. Follow-up your primary care doctor. °

## 2015-01-25 ENCOUNTER — Emergency Department (HOSPITAL_COMMUNITY): Payer: 59

## 2015-01-25 ENCOUNTER — Emergency Department (HOSPITAL_COMMUNITY)
Admission: EM | Admit: 2015-01-25 | Discharge: 2015-01-25 | Disposition: A | Discharge status: Home / Self Care | Payer: 59 | Attending: Emergency Medicine | Admitting: Emergency Medicine

## 2015-01-25 ENCOUNTER — Encounter (HOSPITAL_COMMUNITY): Payer: Self-pay | Admitting: *Deleted

## 2015-01-25 DIAGNOSIS — J45909 Unspecified asthma, uncomplicated: Secondary | ICD-10-CM | POA: Diagnosis not present

## 2015-01-25 DIAGNOSIS — Y92831 Amusement park as the place of occurrence of the external cause: Secondary | ICD-10-CM | POA: Insufficient documentation

## 2015-01-25 DIAGNOSIS — Y998 Other external cause status: Secondary | ICD-10-CM | POA: Insufficient documentation

## 2015-01-25 DIAGNOSIS — Y93I1 Activity, roller coaster riding: Secondary | ICD-10-CM | POA: Insufficient documentation

## 2015-01-25 DIAGNOSIS — G43909 Migraine, unspecified, not intractable, without status migrainosus: Secondary | ICD-10-CM | POA: Insufficient documentation

## 2015-01-25 DIAGNOSIS — W228XXA Striking against or struck by other objects, initial encounter: Secondary | ICD-10-CM | POA: Diagnosis not present

## 2015-01-25 DIAGNOSIS — G43009 Migraine without aura, not intractable, without status migrainosus: Secondary | ICD-10-CM

## 2015-01-25 DIAGNOSIS — Z8659 Personal history of other mental and behavioral disorders: Secondary | ICD-10-CM | POA: Insufficient documentation

## 2015-01-25 DIAGNOSIS — Z79899 Other long term (current) drug therapy: Secondary | ICD-10-CM | POA: Diagnosis not present

## 2015-01-25 DIAGNOSIS — S0990XA Unspecified injury of head, initial encounter: Secondary | ICD-10-CM | POA: Diagnosis present

## 2015-01-25 HISTORY — DX: Headache, unspecified: R51.9

## 2015-01-25 HISTORY — DX: Headache: R51

## 2015-01-25 MED ORDER — METOCLOPRAMIDE HCL 5 MG/ML IJ SOLN
10.0000 mg | Freq: Once | INTRAMUSCULAR | Status: AC
  Administered 2015-01-25: 10 mg via INTRAVENOUS
  Filled 2015-01-25: qty 2

## 2015-01-25 MED ORDER — DIPHENHYDRAMINE HCL 50 MG/ML IJ SOLN
25.0000 mg | Freq: Once | INTRAMUSCULAR | Status: AC
  Administered 2015-01-25: 25 mg via INTRAVENOUS
  Filled 2015-01-25: qty 1

## 2015-01-25 MED ORDER — SODIUM CHLORIDE 0.9 % IV SOLN
1000.0000 mL | Freq: Once | INTRAVENOUS | Status: AC
Start: 2015-01-25 — End: 2015-01-25
  Administered 2015-01-25: 1000 mL via INTRAVENOUS

## 2015-01-25 MED ORDER — SODIUM CHLORIDE 0.9 % IV SOLN
1000.0000 mL | INTRAVENOUS | Status: DC
  Administered 2015-01-25: 1000 mL via INTRAVENOUS
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
## 2015-01-25 NOTE — Discharge Instructions (Signed)

## 2015-01-25 NOTE — ED Notes (Signed)
Pt alert & oriented x4, stable gait. Patient given discharge instructions, paperwork & prescription(s). Patient  instructed to stop at the registration desk to finish any additional paperwork. Patient verbalized understanding. Pt left department w/ no further questions. 

## 2015-06-03 ENCOUNTER — Other Ambulatory Visit: Payer: Self-pay | Admitting: Internal Medicine

## 2015-06-27 DIAGNOSIS — F329 Major depressive disorder, single episode, unspecified: Secondary | ICD-10-CM | POA: Diagnosis not present

## 2015-06-27 MED FILL — SERTRALINE HCL 100 MG TAB: 100 | 90 days supply | Qty: 135 | Fill #0

## 2015-07-07 MED FILL — TOPIRAMATE 100 MG TABLET: 100 | 30 days supply | Qty: 60 | Fill #3

## 2015-07-08 DIAGNOSIS — F329 Major depressive disorder, single episode, unspecified: Secondary | ICD-10-CM | POA: Diagnosis not present

## 2015-07-22 DIAGNOSIS — F329 Major depressive disorder, single episode, unspecified: Secondary | ICD-10-CM | POA: Diagnosis not present

## 2015-07-25 DIAGNOSIS — F329 Major depressive disorder, single episode, unspecified: Secondary | ICD-10-CM | POA: Diagnosis not present

## 2015-08-05 DIAGNOSIS — F329 Major depressive disorder, single episode, unspecified: Secondary | ICD-10-CM | POA: Diagnosis not present

## 2015-08-05 MED FILL — RELPAX 40 MG TABLET: 40 | 30 days supply | Qty: 9 | Fill #2

## 2015-08-05 MED FILL — TOPIRAMATE 100 MG TABLET: 100 | 30 days supply | Qty: 60 | Fill #4

## 2015-08-08 DIAGNOSIS — F329 Major depressive disorder, single episode, unspecified: Secondary | ICD-10-CM | POA: Diagnosis not present

## 2015-08-25 ENCOUNTER — Other Ambulatory Visit: Payer: Self-pay | Admitting: Obstetrics

## 2015-08-25 DIAGNOSIS — Z6829 Body mass index (BMI) 29.0-29.9, adult: Secondary | ICD-10-CM | POA: Diagnosis not present

## 2015-08-25 DIAGNOSIS — F329 Major depressive disorder, single episode, unspecified: Secondary | ICD-10-CM | POA: Diagnosis not present

## 2015-08-25 DIAGNOSIS — Z202 Contact with and (suspected) exposure to infections with a predominantly sexual mode of transmission: Secondary | ICD-10-CM | POA: Diagnosis not present

## 2015-08-25 DIAGNOSIS — Z113 Encounter for screening for infections with a predominantly sexual mode of transmission: Secondary | ICD-10-CM | POA: Diagnosis not present

## 2015-08-25 DIAGNOSIS — Z01419 Encounter for gynecological examination (general) (routine) without abnormal findings: Secondary | ICD-10-CM | POA: Diagnosis not present

## 2015-08-25 DIAGNOSIS — N760 Acute vaginitis: Secondary | ICD-10-CM | POA: Diagnosis not present

## 2015-08-25 MED FILL — metroNIDAZOLE 500 MG TABS: 500 | 7 days supply | Qty: 14 | Fill #0

## 2015-08-29 DIAGNOSIS — F329 Major depressive disorder, single episode, unspecified: Secondary | ICD-10-CM | POA: Diagnosis not present

## 2015-09-02 MED FILL — FLUCONAZOLE 150 MG TABLET: 150 | 3 days supply | Qty: 2 | Fill #0

## 2015-09-05 MED FILL — TOPIRAMATE 100 MG TABLET: 100 | 30 days supply | Qty: 60 | Fill #5

## 2015-09-12 DIAGNOSIS — F329 Major depressive disorder, single episode, unspecified: Secondary | ICD-10-CM | POA: Diagnosis not present

## 2015-09-14 MED FILL — ZOLPIDEM TARTRATE 10 MG TAB: 10 | 30 days supply | Qty: 30 | Fill #0

## 2015-09-26 DIAGNOSIS — F329 Major depressive disorder, single episode, unspecified: Secondary | ICD-10-CM | POA: Diagnosis not present

## 2015-09-26 MED FILL — SERTRALINE HCL 100 MG TAB: 100 | 90 days supply | Qty: 180 | Fill #0

## 2015-10-28 MED FILL — TOPIRAMATE 100 MG TABLET: 100 | 30 days supply | Qty: 60 | Fill #6

## 2015-11-01 MED FILL — ALPRAZolam 0.5 MG TABS: 0.5 | 90 days supply | Qty: 180 | Fill #0

## 2015-11-18 MED FILL — ZOLPIDEM TARTRATE 10 MG TAB: 10 | 90 days supply | Qty: 90 | Fill #0

## 2015-12-01 DIAGNOSIS — F329 Major depressive disorder, single episode, unspecified: Secondary | ICD-10-CM | POA: Diagnosis not present

## 2015-12-19 DIAGNOSIS — F329 Major depressive disorder, single episode, unspecified: Secondary | ICD-10-CM | POA: Diagnosis not present

## 2016-01-10 MED FILL — SERTRALINE HCL 100 MG TAB: 100 | 90 days supply | Qty: 180 | Fill #0

## 2016-01-10 MED FILL — TOPIRAMATE 100 MG TABLET: 100 | 30 days supply | Qty: 60 | Fill #7

## 2016-01-10 MED FILL — ELETRIPTAN HBR 40 MG TABLET: 40 | 30 days supply | Qty: 9 | Fill #3

## 2016-03-13 MED FILL — TOPIRAMATE 100 MG TABLET: 100 | 30 days supply | Qty: 60 | Fill #0

## 2016-03-13 MED FILL — ELETRIPTAN HBR 40 MG TABLET: 40 | 30 days supply | Qty: 9 | Fill #0

## 2016-03-13 MED FILL — VENTOLIN HFA 90 MCG INHALER: 108 (90 BAS | 17 days supply | Qty: 18 | Fill #0

## 2016-03-16 DIAGNOSIS — M7711 Lateral epicondylitis, right elbow: Secondary | ICD-10-CM | POA: Diagnosis not present

## 2016-03-16 DIAGNOSIS — Z1389 Encounter for screening for other disorder: Secondary | ICD-10-CM | POA: Diagnosis not present

## 2016-03-16 DIAGNOSIS — G43109 Migraine with aura, not intractable, without status migrainosus: Secondary | ICD-10-CM | POA: Diagnosis not present

## 2016-03-16 DIAGNOSIS — Z6830 Body mass index (BMI) 30.0-30.9, adult: Secondary | ICD-10-CM | POA: Diagnosis not present

## 2016-03-16 MED FILL — ONDANSETRON ODT 4 MG TABLET: 4 | 10 days supply | Qty: 30 | Fill #0

## 2016-03-21 DIAGNOSIS — G5601 Carpal tunnel syndrome, right upper limb: Secondary | ICD-10-CM | POA: Diagnosis not present

## 2016-04-11 MED FILL — SERTRALINE HCL 100 MG TAB: 100 | 90 days supply | Qty: 180 | Fill #1

## 2016-04-19 MED FILL — TOPIRAMATE 100 MG TABLET: 100 | 30 days supply | Qty: 60 | Fill #0

## 2016-06-06 MED FILL — TOPIRAMATE 100 MG TABLET: 100 | 30 days supply | Qty: 60 | Fill #1

## 2016-06-06 MED FILL — ELETRIPTAN HBR 40 MG TABLET: 40 | 30 days supply | Qty: 9 | Fill #0

## 2016-07-16 MED FILL — SERTRALINE HCL 100 MG TAB: 100 | 90 days supply | Qty: 180 | Fill #0

## 2016-07-20 DIAGNOSIS — F329 Major depressive disorder, single episode, unspecified: Secondary | ICD-10-CM | POA: Diagnosis not present

## 2016-07-29 MED FILL — TOPIRAMATE 100 MG TABLET: 100 | 30 days supply | Qty: 60 | Fill #2

## 2016-09-20 MED FILL — TOPIRAMATE 100 MG TABLET: 100 | 30 days supply | Qty: 60 | Fill #3

## 2016-10-05 MED FILL — ALPRAZolam 0.5 MG TABS: 0.5 | 90 days supply | Qty: 180 | Fill #0

## 2016-10-05 MED FILL — SERTRALINE HCL 100 MG TAB: 100 | 90 days supply | Qty: 210 | Fill #0

## 2016-10-05 MED FILL — ZOLPIDEM TARTRATE 10 MG TAB: 10 | 90 days supply | Qty: 90 | Fill #0

## 2016-10-25 MED FILL — BUPROPION HCL XL 150 MG TAB: 150 | 30 days supply | Qty: 30 | Fill #0

## 2016-10-30 ENCOUNTER — Encounter (HOSPITAL_COMMUNITY): Payer: Self-pay | Admitting: *Deleted

## 2016-10-30 ENCOUNTER — Emergency Department (HOSPITAL_COMMUNITY)
Admission: EM | Admit: 2016-10-30 | Discharge: 2016-10-31 | Disposition: A | Discharge status: Other Acute Inpatient Hospital | Payer: 59 | Attending: Emergency Medicine | Admitting: Emergency Medicine

## 2016-10-30 DIAGNOSIS — J45909 Unspecified asthma, uncomplicated: Secondary | ICD-10-CM | POA: Insufficient documentation

## 2016-10-30 DIAGNOSIS — F332 Major depressive disorder, recurrent severe without psychotic features: Secondary | ICD-10-CM | POA: Diagnosis present

## 2016-10-30 DIAGNOSIS — F329 Major depressive disorder, single episode, unspecified: Secondary | ICD-10-CM | POA: Diagnosis not present

## 2016-10-30 DIAGNOSIS — Z79899 Other long term (current) drug therapy: Secondary | ICD-10-CM | POA: Insufficient documentation

## 2016-10-30 DIAGNOSIS — R45851 Suicidal ideations: Secondary | ICD-10-CM | POA: Insufficient documentation

## 2016-10-30 NOTE — ED Triage Notes (Signed)
Pt stated "I saw my therapist today @ [facility].  She said she would let me go home because I told her I had school work to do but I had to promise I would come to the hospital if my thoughts got any worse."

## 2016-10-31 ENCOUNTER — Encounter (HOSPITAL_COMMUNITY): Payer: Self-pay | Admitting: General Practice

## 2016-10-31 ENCOUNTER — Inpatient Hospital Stay (HOSPITAL_COMMUNITY)
Admission: AD | Admit: 2016-10-31 | Discharge: 2016-11-02 | DRG: 885 | Disposition: A | Discharge status: Home / Self Care | Payer: 59 | Source: Intra-hospital | Attending: Psychiatry | Admitting: Psychiatry

## 2016-10-31 DIAGNOSIS — E739 Lactose intolerance, unspecified: Secondary | ICD-10-CM | POA: Diagnosis present

## 2016-10-31 DIAGNOSIS — Z91018 Allergy to other foods: Secondary | ICD-10-CM

## 2016-10-31 DIAGNOSIS — Z818 Family history of other mental and behavioral disorders: Secondary | ICD-10-CM | POA: Diagnosis not present

## 2016-10-31 DIAGNOSIS — F419 Anxiety disorder, unspecified: Secondary | ICD-10-CM | POA: Diagnosis present

## 2016-10-31 DIAGNOSIS — F332 Major depressive disorder, recurrent severe without psychotic features: Principal | ICD-10-CM | POA: Diagnosis present

## 2016-10-31 DIAGNOSIS — R45851 Suicidal ideations: Secondary | ICD-10-CM | POA: Diagnosis present

## 2016-10-31 DIAGNOSIS — J45909 Unspecified asthma, uncomplicated: Secondary | ICD-10-CM | POA: Diagnosis present

## 2016-10-31 DIAGNOSIS — Z79899 Other long term (current) drug therapy: Secondary | ICD-10-CM | POA: Diagnosis not present

## 2016-10-31 DIAGNOSIS — Z915 Personal history of self-harm: Secondary | ICD-10-CM | POA: Diagnosis not present

## 2016-10-31 DIAGNOSIS — Z793 Long term (current) use of hormonal contraceptives: Secondary | ICD-10-CM | POA: Diagnosis not present

## 2016-10-31 DIAGNOSIS — Z885 Allergy status to narcotic agent status: Secondary | ICD-10-CM

## 2016-10-31 DIAGNOSIS — F5105 Insomnia due to other mental disorder: Secondary | ICD-10-CM | POA: Diagnosis present

## 2016-10-31 HISTORY — DX: Anemia, unspecified: D64.9

## 2016-10-31 HISTORY — DX: Anxiety disorder, unspecified: F41.9

## 2016-10-31 LAB — COMPREHENSIVE METABOLIC PANEL
ALK PHOS: 51 U/L (ref 38–126)
ALT: 13 U/L — ABNORMAL LOW (ref 14–54)
ANION GAP: 7 (ref 5–15)
AST: 17 U/L (ref 15–41)
Albumin: 4.2 g/dL (ref 3.5–5.0)
BILIRUBIN TOTAL: 0.4 mg/dL (ref 0.3–1.2)
BUN: 13 mg/dL (ref 6–20)
CALCIUM: 9.1 mg/dL (ref 8.9–10.3)
CO2: 24 mmol/L (ref 22–32)
Chloride: 110 mmol/L (ref 101–111)
Creatinine, Ser: 0.82 mg/dL (ref 0.44–1.00)
Glucose, Bld: 93 mg/dL (ref 65–99)
Potassium: 3.5 mmol/L (ref 3.5–5.1)
Sodium: 141 mmol/L (ref 135–145)
TOTAL PROTEIN: 7.4 g/dL (ref 6.5–8.1)

## 2016-10-31 LAB — RAPID URINE DRUG SCREEN, HOSP PERFORMED
Amphetamines: NOT DETECTED
Barbiturates: NOT DETECTED
Benzodiazepines: POSITIVE — AB
COCAINE: NOT DETECTED
OPIATES: NOT DETECTED
Tetrahydrocannabinol: NOT DETECTED

## 2016-10-31 LAB — CBC
HCT: 40.7 % (ref 36.0–46.0)
Hemoglobin: 13.3 g/dL (ref 12.0–15.0)
MCH: 27.9 pg (ref 26.0–34.0)
MCHC: 32.7 g/dL (ref 30.0–36.0)
MCV: 85.5 fL (ref 78.0–100.0)
PLATELETS: 227 10*3/uL (ref 150–400)
RBC: 4.76 MIL/uL (ref 3.87–5.11)
RDW: 12.7 % (ref 11.5–15.5)
WBC: 8.3 10*3/uL (ref 4.0–10.5)

## 2016-10-31 LAB — ACETAMINOPHEN LEVEL: Acetaminophen (Tylenol), Serum: <10 ug/mL — ABNORMAL LOW (ref 10–30)

## 2016-10-31 LAB — ETHANOL

## 2016-10-31 LAB — SALICYLATE LEVEL

## 2016-10-31 LAB — PREGNANCY, URINE: Preg Test, Ur: NEGATIVE

## 2016-10-31 MED ORDER — BUPROPION HCL ER (XL) 150 MG PO TB24
150.0000 mg | ORAL_TABLET | Freq: Every morning | ORAL | Status: DC
  Filled 2016-10-31 (×2): qty 1

## 2016-10-31 MED ORDER — SERTRALINE HCL 50 MG PO TABS: 200.0000 mg | ORAL_TABLET | Freq: Every day | ORAL | Status: DC

## 2016-10-31 MED ORDER — LORAZEPAM 0.5 MG PO TABS
0.5000 mg | ORAL_TABLET | Freq: Three times a day (TID) | ORAL | Status: DC | PRN
  Administered 2016-11-02: 0.5 mg via ORAL
  Filled 2016-10-31: qty 1

## 2016-10-31 MED ORDER — TRAMADOL HCL 50 MG PO TABS: 50.0000 mg | ORAL_TABLET | Freq: Four times a day (QID) | ORAL | Status: DC | PRN

## 2016-10-31 MED ORDER — ONDANSETRON 4 MG PO TBDP: 4.0000 mg | ORAL_TABLET | Freq: Three times a day (TID) | ORAL | Status: DC | PRN

## 2016-10-31 MED ORDER — TOPIRAMATE 100 MG PO TABS
100.0000 mg | ORAL_TABLET | Freq: Two times a day (BID) | ORAL | Status: DC
  Administered 2016-10-31 – 2016-11-02 (×4): 100 mg via ORAL
  Filled 2016-10-31 (×7): qty 1

## 2016-10-31 MED ORDER — TOPIRAMATE 100 MG PO TABS
100.0000 mg | ORAL_TABLET | Freq: Two times a day (BID) | ORAL | Status: DC
  Administered 2016-10-31: 100 mg via ORAL
  Filled 2016-10-31: qty 1

## 2016-10-31 MED ORDER — HYDROXYZINE HCL 50 MG PO TABS
50.0000 mg | ORAL_TABLET | Freq: Every evening | ORAL | Status: DC | PRN
  Administered 2016-11-01: 50 mg via ORAL
  Filled 2016-10-31 (×6): qty 1

## 2016-10-31 MED ORDER — BUPROPION HCL ER (XL) 150 MG PO TB24
150.0000 mg | ORAL_TABLET | Freq: Every morning | ORAL | Status: DC
  Administered 2016-10-31: 150 mg via ORAL
  Filled 2016-10-31: qty 1

## 2016-10-31 MED ORDER — ACETAMINOPHEN 325 MG PO TABS
650.0000 mg | ORAL_TABLET | Freq: Four times a day (QID) | ORAL | Status: DC | PRN
Start: 2016-10-31 — End: 2016-11-02

## 2016-10-31 MED ORDER — ZOLPIDEM TARTRATE 5 MG PO TABS: 5.0000 mg | ORAL_TABLET | Freq: Every evening | ORAL | Status: DC | PRN

## 2016-10-31 MED ORDER — ALUM & MAG HYDROXIDE-SIMETH 200-200-20 MG/5ML PO SUSP: 30.0000 mL | ORAL | Status: DC | PRN

## 2016-10-31 MED ORDER — MAGNESIUM HYDROXIDE 400 MG/5ML PO SUSP: 30.0000 mL | Freq: Every day | ORAL | Status: DC | PRN

## 2016-10-31 MED ORDER — ELETRIPTAN HYDROBROMIDE 40 MG PO TABS
40.0000 mg | ORAL_TABLET | ORAL | Status: DC | PRN
  Filled 2016-10-31: qty 1

## 2016-10-31 MED ORDER — ELETRIPTAN HYDROBROMIDE 20 MG PO TABS: 40.0000 mg | ORAL_TABLET | ORAL | Status: DC | PRN

## 2016-10-31 MED ORDER — SERTRALINE HCL 100 MG PO TABS
200.0000 mg | ORAL_TABLET | Freq: Every day | ORAL | Status: DC
  Administered 2016-10-31: 200 mg via ORAL
  Filled 2016-10-31 (×3): qty 2

## 2016-10-31 MED ORDER — ALPRAZOLAM 0.5 MG PO TABS
0.5000 mg | ORAL_TABLET | Freq: Two times a day (BID) | ORAL | Status: DC | PRN
Start: 2016-10-31 — End: 2016-10-31
  Administered 2016-10-31: 1 mg via ORAL
  Filled 2016-10-31: qty 2
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
## 2016-10-31 NOTE — ED Provider Notes (Signed)
[facility]-EMERGENCY DEPT Provider Note   CSN: [id number] Arrival date & time: 10/30/16  2252   By signing my name below, I, Ny'Kea Lewis, attest that this documentation has been prepared under the direction and in the presence of [employer], PA-C. Electronically Signed: Karren Cobble, ED Scribe. 10/31/16. 12:39 AM.   History   Chief Complaint Chief Complaint  Patient presents with  . Suicidal   The history is provided by the patient. No language interpreter was used.    HPI Comments: Shannon Gould is a 41 y.o. female with a hx of depression who presents to the Emergency Department with persistent suicidal ideations. Pt notes she has been experiencing an increasingly amount of stress and it just seems unmanageable. Today she was seen by her therapist and notes her thoughts were managed and she allowed her to go home but advised her that if her thoughts got worse to go to the ED. After her visit today she notes having multiple arguments with her husband that increased her suicidal thoughts. She reports having plans planned suicidal ideations by "running her car into a tree while driving" or "walking into the street and getting hit by a car" so it will all it end. She notes she has a therapist and she has been prescribed medication that helps intermittently. She recently started Welbutrin in additional to the Zoloft. Denies any physical complaints at this time.   Past Medical History:  Diagnosis Date  . Asthma   . Depression   . Head ache     There are no active problems to display for this patient.   Past Surgical History:  Procedure Laterality Date  . CHOLECYSTECTOMY    . transvaginal mesh      OB History    No data available       Home Medications    Prior to Admission medications   Medication Sig Start Date End Date Taking? Authorizing Provider  albuterol (PROVENTIL HFA) 108 (90 BASE) MCG/ACT inhaler Inhale 2 puffs into the lungs every 6 (six) hours as needed. For  shortness of breath    Yes [provider]  ALPRAZolam (XANAX) 0.5 MG tablet Take 0.5-1 mg by mouth 2 (two) times daily as needed for anxiety.   Yes [provider]  buPROPion (WELLBUTRIN XL) 150 MG 24 hr tablet Take 150 mg by mouth every morning. 10/25/16  Yes [provider]  eletriptan (RELPAX) 40 MG tablet Take 40 mg by mouth as needed for migraine or headache. May repeat in 2 hours if headache persists or recurs.   Yes [provider]  levonorgestrel (MIRENA) 20 MCG/24HR IUD 1 each by Intrauterine route once. Inserted almost 2 years ago w/ unknown date    Yes [provider]  ondansetron (ZOFRAN-ODT) 4 MG disintegrating tablet Take 4 mg by mouth every 8 (eight) hours as needed for nausea or vomiting.   Yes [provider]  sertraline (ZOLOFT) 100 MG tablet Take 200 mg by mouth at bedtime.   Yes [provider]  sertraline (ZOLOFT) 100 MG tablet Take 300 mg by mouth daily. For 10 days before menstrual cycle   Yes [provider]  tetrahydrozoline 0.05 % ophthalmic solution Place 1 drop into the left eye daily as needed (dryness).   Yes [provider]  topiramate (TOPAMAX) 100 MG tablet Take 100 mg by mouth 2 (two) times daily.    Yes [provider]  traMADol (ULTRAM) 50 MG tablet Take 50 mg by mouth every  6 (six) hours as needed for moderate pain.   Yes [provider]  zolpidem (AMBIEN) 5 MG tablet Take 5-10 mg by mouth at bedtime as needed for sleep.   Yes [provider]    Family History Family History  Problem Relation Age of Onset  . Hypertension Mother   . Diabetes Mother   . Heart disease Father     Social History Social History  Substance Use Topics  . Smoking status: Never Smoker  . Smokeless tobacco: Never Used  . Alcohol use Yes     Comment: occ     Allergies   Hydrocodone   Review of Systems Review of Systems  Psychiatric/Behavioral: Positive for suicidal  ideas.  A complete 10 system review of systems was obtained and all systems are negative except as noted in the HPI and PMH.    Physical Exam Updated Vital Signs BP 132/64 (BP Location: Left Arm)   Pulse 78   Temp 98.4 F (36.9 C) (Oral)   Resp 16   Ht 5\' 3"  (1.6 m)   Wt 89.8 kg (198 lb)   SpO2 100%   BMI 35.07 kg/m   Physical Exam  Constitutional: She is oriented to person, place, and time. She appears well-developed and well-nourished. No distress.  HENT:  Head: Normocephalic and atraumatic.  Eyes: Conjunctivae and EOM are normal. No scleral icterus.  Neck: Normal range of motion.  Cardiovascular: Normal rate, regular rhythm and intact distal pulses.   Pulmonary/Chest: Effort normal. No respiratory distress. She has no wheezes.  Respirations even and unlabored  Musculoskeletal: Normal range of motion.  Neurological: She is alert and oriented to person, place, and time. She exhibits normal muscle tone. Coordination normal.  Skin: Skin is warm and dry. No rash noted. She is not diaphoretic. No erythema. No pallor.  Psychiatric: Her speech is normal and behavior is normal. She exhibits a depressed mood. She expresses suicidal ideation. She expresses no homicidal ideation. She expresses suicidal plans. She expresses no homicidal plans.  Patient tearful, depressed mood.  Nursing note and vitals reviewed.    ED Treatments / Results  DIAGNOSTIC STUDIES: Oxygen Saturation is 100% on RA, normal by my interpretation.   COORDINATION OF CARE: 12:33 AM-Discussed next steps with pt. Pt verbalized understanding and is agreeable with the plan.   Labs (all labs ordered are listed, but only abnormal results are displayed) Labs Reviewed  COMPREHENSIVE METABOLIC PANEL - Abnormal; Notable for the following:       Result Value   ALT 13 (*)    All other components within normal limits  ACETAMINOPHEN LEVEL - Abnormal; Notable for the following:    Acetaminophen (Tylenol), Serum <10 (*)     All other components within normal limits  RAPID URINE DRUG SCREEN, HOSP PERFORMED - Abnormal; Notable for the following:    Benzodiazepines POSITIVE (*)    All other components within normal limits  ETHANOL  SALICYLATE LEVEL  CBC  PREGNANCY, URINE    EKG  EKG Interpretation None       Radiology No results found.  Procedures Procedures (including critical care time)  Medications Ordered in ED Medications - No data to display   Initial Impression / Assessment and Plan / ED Course  I have reviewed the triage vital signs and the nursing notes.  Pertinent labs & imaging results that were available during my care of the patient were reviewed by me and considered in my medical decision making (see chart for details).  41 year old female presents to the emergency department for worsening depression and suicidal ideations. She has been medically cleared and evaluated by TTS who recommended inpatient treatment. Placement pending. Disposition to be determined by oncoming ED provider.   Final Clinical Impressions(s) / ED Diagnoses   Final diagnoses:  Severe episode of recurrent major depressive disorder, without psychotic features (HCC)  Suicidal ideation    New Prescriptions New Prescriptions   No medications on file   I personally performed the services described in this documentation, which was scribed in my presence. The recorded information has been reviewed and is accurate.       Antony MaduraHumes, Karsin Pesta, PA-C 10/31/16 [id number]    Shon BatonHorton, Courtney F, MD 10/31/16 [phone]

## 2016-10-31 NOTE — Progress Notes (Signed)
Per Donell SievertSpencer, Simon NP meets inpatient criteria Elsie LincolnShean K. Aarohi Redditt, LCAS-A, LPC-A, [facility]  Counselor 10/31/2016 2:04 AM

## 2016-10-31 NOTE — Progress Notes (Signed)
Adult Psychoeducational Group Note  Date:  10/31/2016 Time:  9:26 PM  Group Topic/Focus:  Wrap-Up Group:   The focus of this group is to help patients review their daily goal of treatment and discuss progress on daily workbooks.  Participation Level:  Active  Participation Quality:  Appropriate and Attentive  Affect:  Appropriate  Cognitive:  Appropriate  Insight: Appropriate  Engagement in Group:  Engaged  Modes of Intervention:  Discussion  Additional Comments:  Pt stated her goal was to get some help. Pt stated she is here now to get help.  [name] C 10/31/2016, 9:26 PM 

## 2016-10-31 NOTE — BH Assessment (Signed)
[facility] Assessment Progress Note   Per Thedore MinsMojeed Akintayo, MD, this pt requires psychiatric hospitalization at this time.  Malva LimesLinsey Strader, RN, [id number]; they will be ready to receive pt at 13:30.  Pt has signed Voluntary Admission and Consent for Treatment, as well as Consent to Release Information to [facility], to her mother and to a friend, and notification calls have been placed to all.  [facility].  Pt's nurse has been notified, and agrees to send original paperwork along with pt via [facility], and to call report to [phone].  Doylene Canninghomas Francie Keeling, MA Triage Specialist [phone]

## 2016-10-31 NOTE — ED Notes (Signed)
TTS assessment in progress. 

## 2016-10-31 NOTE — ED Notes (Addendum)
Pt stated "I feel like everything is my fault.  My husband has been dx'd with diabetes, I try to get him to take his meds.  I'm working on my RN @ [employer].  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  I'm taking the prerequisites, psychology online, to get in the RN program.  I have a son that's getting ready to go to college and the younger ones stay at my mom's a lot.  I was thinking I could give her parental rights and share the time with my husband.  I was thinking about driving in front of a tractor trailer.  I want a separation and I feel like my children can handle that so I feel like they could handle if I wasn't here.  I take xanax twice a day & my therapist said I could take more but I don't want to get hooked on them.  She told me I wouldn't."  Pt committed verbally to No Harm.

## 2016-10-31 NOTE — Tx Team (Signed)
Initial Treatment Plan 10/31/2016 4:39 PM Shannon PockLukisha G Zaun ZOX:[id number]    PATIENT STRESSORS: Marital or family conflict   PATIENT STRENGTHS: Wellsite geologistCommunication skills General fund of knowledge Supportive family/friends Work skills   PATIENT IDENTIFIED PROBLEMS: "I think my husband is manipulative"- marital conflict     "Work on my depression"    "Work on my anxiety"             DISCHARGE CRITERIA:  Improved stabilization in mood, thinking, and/or behavior Verbal commitment to aftercare and medication compliance  PRELIMINARY DISCHARGE PLAN: Outpatient therapy Return to previous living arrangement  PATIENT/FAMILY INVOLVEMENT: This treatment plan has been presented to and reviewed with the patient, Shannon Gould.  The patient and family have been given the opportunity to ask questions and make suggestions.  Larina Earthlyopson, Shannon Gould E, RN 10/31/2016, 4:39 PM

## 2016-10-31 NOTE — Progress Notes (Signed)
Patient ID: Shannon Gould, female   DOB: [birth year], 41 y.o.   MRN: [id number]  [name] is a 41 year old African American female [facility] after endorsing suicidal ideations with a plan to drive into traffic. She reports marital conflict with husband. She reports depression, anxiety, and asthma as her past medical history. She denies SI/HI and A/V hallucinations at time of admission but reports she is able to contract for safety if this changes. Patient is cooperative with admission process and oriented to the unit. She reports that she works "at [employer] Energy[employer] desk" at [employer] [place]. She reports that she has three teenage children that are supportive, her parents, her boss, and her best friend are also considered to be supports. She reports marital conflict with her husband at this time. She sees an outpatient therapist who encouraged her to seek inpatient treatment. Patient was provided with hygiene products. She appears anxious in affect and mood but appropriate to situation. Q15 minute safety checks are initiated and maintained.

## 2016-10-31 NOTE — ED Notes (Signed)
Report givent o [facility]. [facility] transport here at this time to take patient across the street.

## 2016-11-01 DIAGNOSIS — R45851 Suicidal ideations: Secondary | ICD-10-CM

## 2016-11-01 DIAGNOSIS — F332 Major depressive disorder, recurrent severe without psychotic features: Principal | ICD-10-CM

## 2016-11-01 MED ORDER — ZOLPIDEM TARTRATE 5 MG PO TABS: 5.0000 mg | ORAL_TABLET | Freq: Every evening | ORAL | Status: DC | PRN

## 2016-11-01 MED ORDER — SERTRALINE HCL 100 MG PO TABS
100.0000 mg | ORAL_TABLET | Freq: Every day | ORAL | Status: DC
  Administered 2016-11-01: 100 mg via ORAL
  Filled 2016-11-01 (×2): qty 1

## 2016-11-01 MED ORDER — VENLAFAXINE HCL ER 75 MG PO CP24
75.0000 mg | ORAL_CAPSULE | Freq: Every day | ORAL | Status: DC
  Administered 2016-11-01 – 2016-11-02 (×2): 75 mg via ORAL
  Filled 2016-11-01 (×4): qty 1

## 2016-11-01 NOTE — [facility] Group Notes (Signed)
[facility] LCSW Group Therapy 11/01/2016 1:15pm  Type of Therapy: Group Therapy- Balance in Life  Participation Level: Active   Description of the Group:  The topic for group was balance in life. Today's group focused on defining balance in one's own words, identifying things that can knock one off balance, and exploring healthy ways to maintain balance in life. Group members were asked to provide an example of a time when they felt off balance, describe how they handled that situation,and process healthier ways to regain balance in the future. Group members were asked to share the most important tool for maintaining balance that they learned [facility] and how they plan to apply this method after discharge.   Therapeutic Modalities:   Cognitive Behavioral Therapy Solution-Focused Therapy Assertiveness Training   Jonathon JordanLynn B Taeler Winning, MSW, LCSWA 11/01/2016 4:07 PM'
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
## 2016-11-01 NOTE — Progress Notes (Signed)
Pt hs been in her room most of the evening, but did come out to attend evening group.  Pt is new to the unit this afternoon.  Pt is hopeful to get the help she needs to deal with her family situation.  Pt denies SI/HI/AVH.  She says that although her husband is not supportive at this time, she does have other positive supports that are available to her.  She was encouraged to make her needs known to staff.  Pt requested a sleep aid which was ordered for her and given.  Support and encouragement offered.  Discharge plans are in process.  Pt plans to return home at discharge.  Safety maintained with q15 minute checks.

## 2016-11-01 NOTE — Tx Team (Signed)
Interdisciplinary Treatment and Diagnostic Plan Update 11/01/2016 Time of Session: 9:30am  Shannon Gould  MRN: [id number]  Principal Diagnosis:  MDD SEVERE  Secondary Diagnoses: Active Problems:   Major depressive disorder, recurrent severe without psychotic features (HCC)   Current Medications:  Current Facility-Administered Medications  Medication Dose Route Frequency Provider Last Rate Last Dose  . acetaminophen (TYLENOL) tablet 650 mg  650 mg Oral Q6H PRN Charm Rings, NP      . alum & mag hydroxide-simeth (MAALOX/MYLANTA) 200-200-20 MG/5ML suspension 30 mL  30 mL Oral Q4H PRN Charm Rings, NP      . eletriptan (RELPAX) tablet 40 mg  40 mg Oral Q2H PRN Charm Rings, NP      . hydrOXYzine (ATARAX/VISTARIL) tablet 50 mg  50 mg Oral QHS,MR X 1 Simon, Spencer E, PA-C      . LORazepam (ATIVAN) tablet 0.5 mg  0.5 mg Oral Q8H PRN Adonis Brook, NP      . magnesium hydroxide (MILK OF MAGNESIA) suspension 30 mL  30 mL Oral Daily PRN Charm Rings, NP      . ondansetron (ZOFRAN-ODT) disintegrating tablet 4 mg  4 mg Oral Q8H PRN Charm Rings, NP      . sertraline (ZOLOFT) tablet 100 mg  100 mg Oral QHS Burnard Leigh, MD      . topiramate (TOPAMAX) tablet 100 mg  100 mg Oral BID Charm Rings, NP   100 mg at 11/01/16 4098  . venlafaxine XR (EFFEXOR-XR) 24 hr capsule 75 mg  75 mg Oral Q breakfast Burnard Leigh, MD   75 mg at 11/01/16 1318  . zolpidem (AMBIEN) tablet 5 mg  5 mg Oral QHS PRN Burnard Leigh, MD        PTA Medications: Prescriptions Prior to Admission  Medication Sig Dispense Refill Last Dose  . albuterol (PROVENTIL HFA) 108 (90 BASE) MCG/ACT inhaler Inhale 2 puffs into the lungs every 6 (six) hours as needed. For shortness of breath    Past Month at Unknown time  . buPROPion (WELLBUTRIN XL) 150 MG 24 hr tablet Take 150 mg by mouth every morning.  0 10/30/2016 at 1100  . eletriptan (RELPAX) 40 MG tablet Take 40 mg by mouth as needed for  migraine or headache. May repeat in 2 hours if headache persists or recurs.   Past Month at Unknown time  . levonorgestrel (MIRENA) 20 MCG/24HR IUD 1 each by Intrauterine route once. Inserted almost 2 years ago w/ unknown date    2017  . ondansetron (ZOFRAN-ODT) 4 MG disintegrating tablet Take 4 mg by mouth every 8 (eight) hours as needed for nausea or vomiting.   Past Month at Unknown time  . sertraline (ZOLOFT) 100 MG tablet Take 200 mg by mouth at bedtime.   Past Week at Unknown time  . sertraline (ZOLOFT) 100 MG tablet Take 300 mg by mouth daily. For 10 days before menstrual cycle   Past Week at Unknown time  . tetrahydrozoline 0.05 % ophthalmic solution Place 1 drop into the left eye daily as needed (dryness).   Past Month at Unknown time  . topiramate (TOPAMAX) 100 MG tablet Take 100 mg by mouth 2 (two) times daily.    10/30/2016 at 1100    Treatment Modalities: Medication Management, Group therapy, Case management,  1 to 1 session with clinician, Psychoeducation, Recreational therapy.  Patient Stressors: Marital or family conflict Patient Strengths: Wellsite geologist fund of knowledge Supportive family/friends  Work [employer]  Physician Treatment Plan for Primary Diagnosis:  MDD SEVERE Long Term Goal(s): Improvement in symptoms so as ready for discharge Short Term Goals: Ability to identify changes in lifestyle to reduce recurrence of condition will improve Ability to verbalize feelings will improve Ability to disclose and discuss suicidal ideas Ability to identify changes in lifestyle to reduce recurrence of condition will improve Ability to verbalize feelings will improve Ability to disclose and discuss suicidal ideas Ability to identify triggers associated with substance abuse/mental health issues will improve  Medication Management: Evaluate patient's response, side effects, and tolerance of medication regimen.  Therapeutic Interventions: 1 to 1 sessions, Unit Group  sessions and Medication administration.  Evaluation of Outcomes: Adequate for Discharge  Physician Treatment Plan for Secondary Diagnosis: Active Problems:   Major depressive disorder, recurrent severe without psychotic features (HCC)  Long Term Goal(s): Improvement in symptoms so as ready for discharge  Short Term Goals: Ability to identify changes in lifestyle to reduce recurrence of condition will improve Ability to verbalize feelings will improve Ability to disclose and discuss suicidal ideas Ability to identify changes in lifestyle to reduce recurrence of condition will improve Ability to verbalize feelings will improve Ability to disclose and discuss suicidal ideas Ability to identify triggers associated with substance abuse/mental health issues will improve  Medication Management: Evaluate patient's response, side effects, and tolerance of medication regimen.  Therapeutic Interventions: 1 to 1 sessions, Unit Group sessions and Medication administration.  Evaluation of Outcomes: Adequate for Discharge  RN Treatment Plan for Primary Diagnosis:  MDD SEVERE Long Term Goal(s): Knowledge of disease and therapeutic regimen to maintain health will improve  Short Term Goals: Compliance with prescribed medications will improve  Medication Management: RN will administer medications as ordered by provider, will assess and evaluate patient's response and provide education to patient for prescribed medication. RN will report any adverse and/or side effects to prescribing provider.  Therapeutic Interventions: 1 on 1 counseling sessions, Psychoeducation, Medication administration, Evaluate responses to treatment, Monitor vital signs and CBGs as ordered, Perform/monitor CIWA, COWS, AIMS and Fall Risk screenings as ordered, Perform wound care treatments as ordered.  Evaluation of Outcomes: Adequate for Discharge  LCSW Treatment Plan for Primary Diagnosis:  MDD SEVERE Long Term Goal(s): Safe  transition to appropriate next level of care at discharge, Engage patient in therapeutic group addressing interpersonal concerns. Short Term Goals: Engage patient in aftercare planning with referrals and resources, Increase emotional regulation, Identify triggers associated with mental health/substance abuse issues and Increase skills for wellness and recovery  Therapeutic Interventions: Assess for all discharge needs, 1 to 1 time with Social worker, Explore available resources and support systems, Assess for adequacy in community support network, Educate family and significant other(s) on suicide prevention, Complete Psychosocial Assessment, Interpersonal group therapy.  Evaluation of Outcomes: Adequate for Discharge  Progress in Treatment: Attending groups: Yes Participating in groups: Yes Taking medication as prescribed: Yes, MD continues to assess for medication changes as needed Toleration medication: Yes, no side effects reported at this time Family/Significant other contact made: No, CSW will make contact before discharge if pt consents. Patient understands diagnosis: Yes, AEB pt's willingness to participcate in treatment. Discussing patient identified problems/goals with staff: Yes Medical problems stabilized or resolved: Yes Denies suicidal/homicidal ideation: Yes Issues/concerns per patient self-inventory: None Other: N/A  New problem(s) identified: None identified at this time.   New Short Term/Long Term Goal(s): None identified at this time.   Discharge Plan or Barriers: Pt will return home and follow-up  outpatient with [facility].  Reason for Continuation of Hospitalization:  Medication stabilization  Estimated Length of Stay: 0-1 days  Attendees: Patient: 11/01/2016 4:58 PM  Physician: Dr. Rene Kocher 11/01/2016 4:58 PM  Nursing: Fletcher Anon, RN 11/01/2016 4:58 PM  RN Care Manager:  11/01/2016 4:58 PM  Social Worker: Donnelly Stager, LCSWA 11/01/2016 4:58 PM  Recreational  Therapist:  11/01/2016 4:58 PM  Other: Armandina Stammer, NP 11/01/2016 4:58 PM  Other:  11/01/2016 4:58 PM  Other: 11/01/2016 4:58 PM  Scribe for Treatment Team: Jonathon Jordan, MSW,LCSWA 11/01/2016 4:58 PM

## 2016-11-01 NOTE — Progress Notes (Signed)
Pt attended wrap up karaoke group. Pt was engaged. Shannon Gould, NT 11/01/16  9:57 PM 

## 2016-11-01 NOTE — [facility] Group Notes (Signed)
[facility] Group Notes:  (Nursing/MHT/Case Management/Adjunct)  Date:  11/01/2016  Time:  2:57 PM Type of Therapy:  Psychoeducational Skills  Participation Level:  Active  Participation Quality:  Appropriate  Affect:  Appropriate  Cognitive:  Appropriate  Insight:  Appropriate  Engagement in Group:  Engaged  Modes of Intervention:  Problem-solving  Summary of Progress/Problems: Pt's goal for today is to talk with social worker about D/C    Bethann PunchesJane O Nayan Proch 11/01/2016, 2:57 PM
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
## 2016-11-01 NOTE — Progress Notes (Signed)
DAR NOTE: Pt present with flat affect and depressed mood in the unit. Pt has been in the dayroom, pt attended and participated in the group. Pt denies physical pain, took all her meds as scheduled. As per self inventory, pt had a good night sleep, fair appetite, low energy, and poor concentration. Pt rate depression at 8, hopeless ness at 7, and anxiety 8. Pt's safety ensured with 15 minute and environmental checks. Pt currently denies SI/HI and A/V hallucinations. Pt verbally agrees to seek staff if SI/HI or A/VH occurs and to consult with staff before acting on these thoughts. Will continue POC.

## 2016-11-02 MED ORDER — HYDROXYZINE HCL 50 MG PO TABS: 50.0000 mg | ORAL_TABLET | Freq: Every evening | ORAL | 0 refills | Status: DC | PRN

## 2016-11-02 MED ORDER — VENLAFAXINE HCL ER 75 MG PO CP24: 75.0000 mg | ORAL_CAPSULE | Freq: Every day | ORAL | 0 refills | Status: DC

## 2016-11-02 MED ORDER — TOPIRAMATE 100 MG PO TABS: 100.0000 mg | ORAL_TABLET | Freq: Two times a day (BID) | ORAL | 0 refills | Status: DC

## 2016-11-02 MED ORDER — ZOLPIDEM TARTRATE 5 MG PO TABS: 5.0000 mg | ORAL_TABLET | Freq: Every evening | ORAL | 0 refills | Status: DC | PRN

## 2016-11-02 MED ORDER — SERTRALINE HCL 100 MG PO TABS: 100.0000 mg | ORAL_TABLET | Freq: Every day | ORAL | 0 refills | Status: DC

## 2016-11-02 MED FILL — TOPIRAMATE 100 MG TABLET: 100 | 30 days supply | Qty: 60 | Fill #0

## 2016-11-02 MED FILL — hydrOXYzine HCL 50 MG TABS: 50 | 15 days supply | Qty: 30 | Fill #0

## 2016-11-02 MED FILL — VENLAFAXINE HCL ER 75 MG CA: 75 | 30 days supply | Qty: 30 | Fill #0

## 2016-11-02 NOTE — Progress Notes (Signed)
Data. Patient denies SI/HI/AVH. Patient interacting well with staff and other patients. She expresses concern about the family session she and her husband are going to attend prior to her discharge today. She stated the meeting went we, He took it well. That concerns me, as I am waiting for the other shoe to drop." Affect is bright and her mood hopeful.  Action. Emotional support and encouragement offered. Education provided on medication, indications and side effect. Q 15 minute checks done for safety. Response. Safety on the unit maintained through 15 minute checks.  Medications taken as prescribed. Attended groups. Remained calm and appropriate through out shift.  Pt. discharged to lobby.  Belongings sheet reviewed and signed by pt. and all belongings, including scripts,  sent home. Paperwork reviewed and pt. able to verbalize understanding of education. Pt. in no current distress and ambulatory.
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
## 2016-11-02 NOTE — Progress Notes (Signed)
  [facility] :  Will you be returning to the same living situation after discharge:  No. Pt going to live with her mother. At discharge, do you have transportation home?: Yes,  pt's mother will discharge. Do you have the ability to pay for your medications: Yes,  pt has insurance.  Release of information consent forms completed and in the chart;  Patient's signature needed at discharge.  Patient to Follow up at: Follow-up Information    [facility]   Specialty:  Behavioral Health Why:  Medication management appointment 6/4 @ 11am with Aggie Cosierheresa. Therapy appointment 6/21 @ 2pm with Peyton NajjarLarry. This was the first available appointment for your preferred provider. Contact information: [address] [place] [address] [phone]           Next level of care provider has access to [facility] Link:no  Safety Planning and Suicide Prevention discussed: Yes,  with pt and with pt's mother.  Have you used any form of tobacco in the last 30 days? (Cigarettes, Smokeless Tobacco, Cigars, and/or Pipes): No  Has patient been referred to the Quitline?: N/A patient is not a smoker  Patient has been referred for addiction treatment: N/A  Jonathon JordanLynn B Flavia Bruss , MSW, LCSWA 11/02/2016, 9:30 AM

## 2016-11-02 NOTE — [facility] Suicide Risk Assessment (Signed)
[facility] INPATIENT:  Family/Significant Other Suicide Prevention Education  Suicide Prevention Education:  Education Completed; Shannon DameSylvia Galloway (mother [phone]),has been identified by the patient as the family member/significant other with whom the patient will be residing, and identified as the person(s) who will aid the patient in the event of a mental health crisis (suicidal ideations/suicide attempt).  With written consent from the patient, the family member/significant other has been provided the following suicide prevention education, prior to the and/or following the discharge of the patient.  The suicide prevention education provided includes the following:  Suicide risk factors  Suicide prevention and interventions  National Suicide Hotline telephone number  [facility] assessment telephone number  [facility] Emergency Assistance 911  [facility] and/or Residential Mobile Crisis Unit telephone number  Request made of family/significant other to:  Remove weapons (e.g., guns, rifles, knives), all items previously/currently identified as safety concern.    Remove drugs/medications (over-the-counter, prescriptions, illicit drugs), all items previously/currently identified as a safety concern.  The family member/significant other verbalizes understanding of the suicide prevention education information provided.  The family member/significant other agrees to remove the items of safety concern listed above.  Pt's mother states she has no concerns about pt discharging today. Pt's mother also confirms that pt does not have access to guns at home.  Shannon Gould, MSW, LCSWA 11/02/2016, 9:26 AM

## 2016-11-02 NOTE — Progress Notes (Addendum)
Patient ID: Shannon Gould, female   DOB: 1975/07/16, 41 y.o.   MRN: [id number]  Pt currently presents with a worried affect and cooperative behavior. Pt reports to Clinical research associatewriter that their goal is to "go home with my mom tomorrow." Pt states "I needed to get out of the situation I was in to see that it really was toxic." Pt anxious about the family session she will be having tomorrow and how her significant other will respond. Pt reports good sleep with current medication regimen. Main complaint is of a tension headache, 6/10.   Pt provided with medications per providers orders. Pt's labs and vitals were monitored throughout the night. Pt given a 1:1 about emotional and mental status. Pt supported and encouraged to express concerns and questions. Pt educated on medications. Given handout and demonstration of how to use pressure points for headache relief.   Pt's safety ensured with 15 minute and environmental checks. Pt currently denies SI/HI and A/V hallucinations. Pt verbally agrees to seek staff if SI/HI or A/VH occurs and to consult with staff before acting on any harmful thoughts. Will continue POC.
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
## 2016-11-02 NOTE — Progress Notes (Signed)
Recreation Therapy Notes  Date:  11/02/16 Time: 0930 Location: [address] Dayroom  Group Topic: Stress Management  Goal Area(s) Addresses:  Patient will verbalize importance of using healthy stress management.  Patient will identify positive emotions associated with healthy stress management.   Behavioral Response: Engaged  Intervention: Stress Management  Activity :  Guided Imagery.  LRT introduced the stress management technique of guided imagery.  LRT read a script to allow patients to engage in the activity.  Patients were to follow along with as LRT read the script to participate in the activity.  Education:  Stress Management, Discharge Planning.   Education Outcome: Acknowledges edcuation/In group clarification offered/Needs additional education  Clinical Observations/Feedback: Pt attended group.   Shannon Gould, LRT/CTRS         Anjelika Ausburn A 11/02/2016 11:24 AM 

## 2016-11-02 NOTE — Discharge Instructions (Signed)
Continue Effexor 75 mg XR daily for 3 days, then increase to 150 mg XR daily  Continue Zoloft 50 mg nightly (half of a 100 mg tablet) for 3 days, then once you increase Effexor, go ahead and completely stop the zoloft  STOP wellbutrin

## 2016-11-05 ENCOUNTER — Encounter (HOSPITAL_COMMUNITY): Payer: Self-pay | Admitting: Psychiatry

## 2016-11-06 DIAGNOSIS — F329 Major depressive disorder, single episode, unspecified: Secondary | ICD-10-CM | POA: Diagnosis not present

## 2016-11-16 DIAGNOSIS — F329 Major depressive disorder, single episode, unspecified: Secondary | ICD-10-CM | POA: Diagnosis not present

## 2016-11-16 MED FILL — VENLAFAXINE HCL ER 150 MG C: 150 | 30 days supply | Qty: 30 | Fill #0

## 2016-12-17 DIAGNOSIS — F329 Major depressive disorder, single episode, unspecified: Secondary | ICD-10-CM | POA: Diagnosis not present

## 2016-12-19 MED FILL — TOPIRAMATE 100 MG TABLET: 100 | 30 days supply | Qty: 60 | Fill #4

## 2016-12-19 MED FILL — ONDANSETRON ODT 4 MG TABLET: 4 | 10 days supply | Qty: 30 | Fill #1

## 2016-12-21 MED FILL — VENLAFAXINE HCL ER 150 MG C: 150 | 90 days supply | Qty: 90 | Fill #0

## 2017-01-03 DIAGNOSIS — F329 Major depressive disorder, single episode, unspecified: Secondary | ICD-10-CM | POA: Diagnosis not present

## 2017-01-10 DIAGNOSIS — F329 Major depressive disorder, single episode, unspecified: Secondary | ICD-10-CM | POA: Diagnosis not present

## 2017-01-25 MED FILL — TOPIRAMATE 100 MG TABLET: 100 | 30 days supply | Qty: 60 | Fill #5

## 2017-03-01 DIAGNOSIS — D509 Iron deficiency anemia, unspecified: Secondary | ICD-10-CM | POA: Diagnosis not present

## 2017-03-01 DIAGNOSIS — J309 Allergic rhinitis, unspecified: Secondary | ICD-10-CM | POA: Diagnosis not present

## 2017-03-01 DIAGNOSIS — Z6832 Body mass index (BMI) 32.0-32.9, adult: Secondary | ICD-10-CM | POA: Diagnosis not present

## 2017-03-01 DIAGNOSIS — G43009 Migraine without aura, not intractable, without status migrainosus: Secondary | ICD-10-CM | POA: Diagnosis not present

## 2017-03-01 DIAGNOSIS — G43909 Migraine, unspecified, not intractable, without status migrainosus: Secondary | ICD-10-CM | POA: Diagnosis not present

## 2017-03-01 DIAGNOSIS — Z1389 Encounter for screening for other disorder: Secondary | ICD-10-CM | POA: Diagnosis not present

## 2017-03-01 DIAGNOSIS — F329 Major depressive disorder, single episode, unspecified: Secondary | ICD-10-CM | POA: Diagnosis not present

## 2017-03-01 MED FILL — TOPIRAMATE 100 MG TABLET: 100 | 30 days supply | Qty: 60 | Fill #0

## 2017-03-01 MED FILL — MONTELUKAST SOD 10 MG TAB: 10 | 90 days supply | Qty: 90 | Fill #0

## 2017-03-15 MED FILL — VIT D2 1.25 MG (50,000 UNIT: 1.25 MG | 84 days supply | Qty: 12 | Fill #0

## 2017-03-29 DIAGNOSIS — F329 Major depressive disorder, single episode, unspecified: Secondary | ICD-10-CM | POA: Diagnosis not present

## 2017-04-09 MED FILL — VENLAFAXINE HCL ER 150 MG C: 150 | 90 days supply | Qty: 90 | Fill #0

## 2017-04-09 MED FILL — TRAMADOL-ACETAMINOPHN 37.5-: 37.5-325 | 15 days supply | Qty: 90 | Fill #0

## 2017-04-09 MED FILL — HYDROXYZINE PAM 50 MG CAP: 50 | 90 days supply | Qty: 90 | Fill #0

## 2017-04-10 MED FILL — ELETRIPTAN HBR 40 MG TABLET: 40 | 30 days supply | Qty: 12 | Fill #0

## 2017-04-18 MED FILL — TOPIRAMATE 100 MG TABLET: 100 | 30 days supply | Qty: 60 | Fill #1

## 2017-05-03 ENCOUNTER — Other Ambulatory Visit: Payer: Self-pay | Admitting: Adult Health

## 2017-05-03 DIAGNOSIS — B9689 Other specified bacterial agents as the cause of diseases classified elsewhere: Secondary | ICD-10-CM

## 2017-05-03 DIAGNOSIS — N760 Acute vaginitis: Principal | ICD-10-CM

## 2017-05-03 MED ORDER — METRONIDAZOLE 250 MG PO TABS: 500.0000 mg | ORAL_TABLET | Freq: Two times a day (BID) | ORAL | Status: AC

## 2017-05-17 ENCOUNTER — Emergency Department (HOSPITAL_COMMUNITY): Payer: 59

## 2017-05-17 ENCOUNTER — Encounter (HOSPITAL_COMMUNITY): Payer: Self-pay | Admitting: *Deleted

## 2017-05-17 ENCOUNTER — Emergency Department (HOSPITAL_COMMUNITY)
Admission: EM | Admit: 2017-05-17 | Discharge: 2017-05-17 | Disposition: A | Discharge status: Home / Self Care | Payer: 59 | Attending: Emergency Medicine | Admitting: Emergency Medicine

## 2017-05-17 ENCOUNTER — Other Ambulatory Visit: Payer: Self-pay

## 2017-05-17 DIAGNOSIS — J45909 Unspecified asthma, uncomplicated: Secondary | ICD-10-CM | POA: Insufficient documentation

## 2017-05-17 DIAGNOSIS — R109 Unspecified abdominal pain: Secondary | ICD-10-CM | POA: Insufficient documentation

## 2017-05-17 DIAGNOSIS — Z79899 Other long term (current) drug therapy: Secondary | ICD-10-CM | POA: Insufficient documentation

## 2017-05-17 DIAGNOSIS — M545 Low back pain: Secondary | ICD-10-CM | POA: Diagnosis not present

## 2017-05-17 LAB — URINALYSIS, ROUTINE W REFLEX MICROSCOPIC
BILIRUBIN URINE: NEGATIVE
Glucose, UA: NEGATIVE mg/dL
Hgb urine dipstick: NEGATIVE
KETONES UR: NEGATIVE mg/dL
LEUKOCYTES UA: NEGATIVE
Nitrite: NEGATIVE
Protein, ur: 30 mg/dL — AB
SPECIFIC GRAVITY, URINE: 1.03 (ref 1.005–1.030)
pH: 5 (ref 5.0–8.0)

## 2017-05-17 LAB — PREGNANCY, URINE: PREG TEST UR: NEGATIVE

## 2017-05-17 MED ORDER — HYDROMORPHONE HCL 1 MG/ML IJ SOLN
1.0000 mg | Freq: Once | INTRAMUSCULAR | Status: AC
  Administered 2017-05-17: 1 mg via INTRAMUSCULAR
  Filled 2017-05-17: qty 1

## 2017-05-17 MED ORDER — ONDANSETRON 4 MG PO TBDP
4.0000 mg | ORAL_TABLET | Freq: Once | ORAL | Status: AC
  Administered 2017-05-17: 4 mg via ORAL
  Filled 2017-05-17: qty 1

## 2017-05-17 MED ORDER — NAPROXEN 500 MG PO TABS: 500.0000 mg | ORAL_TABLET | Freq: Two times a day (BID) | ORAL | 0 refills | Status: DC

## 2017-05-17 NOTE — ED Provider Notes (Signed)
[facility] EMERGENCY DEPARTMENT Provider Note   CSN: [id number] Arrival date & time: 05/17/17  1918     History   Chief Complaint Chief Complaint  Patient presents with  . Flank Pain    HPI Shannon Gould is a 41 y.o. female.  Patient complains of pain in her right lower back radiating down her right leg   The history is provided by the patient.  Flank Pain  This is a new problem. The current episode started more than 2 days ago. The problem occurs constantly. The problem has not changed since onset.Pertinent negatives include no chest pain, no abdominal pain and no headaches. Exacerbated by: Bending over or lifting right leg. Nothing relieves the symptoms. She has tried nothing for the symptoms. The treatment provided no relief.    Past Medical History:  Diagnosis Date  . Anemia    hx of patient reports   . Anxiety   . Asthma   . Depression   . Head ache     Patient Active Problem List   Diagnosis Date Noted  . Major depressive disorder, 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/31/2016    Past Surgical History:  Procedure Laterality Date  . CHOLECYSTECTOMY    . transvaginal mesh      OB History    No data available       Home Medications    Prior to Admission medications   Medication Sig Start Date End Date Taking? Authorizing Provider  albuterol (PROVENTIL HFA) 108 (90 BASE) MCG/ACT inhaler Inhale 2 puffs into the lungs every 6 (six) hours as needed. For shortness of breath    Yes [provider]  ALPRAZolam (XANAX) 0.5 MG tablet Take 0.5 mg by mouth 2 (two) times daily as needed for anxiety.   Yes [provider]  eletriptan (RELPAX) 40 MG tablet Take 40 mg by mouth as needed for migraine or headache. May repeat in 2 hours if headache persists or recurs.   Yes [provider]  hydrOXYzine (ATARAX/VISTARIL) 50 MG tablet Take 1 tablet (50 mg total) by mouth at bedtime and may repeat dose one time if needed.  11/02/16  Yes Oneta Rack, NP  montelukast (SINGULAIR) 10 MG tablet Take 10 mg by mouth daily. 03/01/17  Yes [provider]  topiramate (TOPAMAX) 100 MG tablet Take 1 tablet (100 mg total) by mouth 2 (two) times daily. 11/02/16  Yes Oneta Rack, NP  traMADol-acetaminophen (ULTRACET) 37.5-325 MG tablet TAKE 1 2 TABLETS BY MOUTH 3 TIMES A DAY AS NEEDED 04/09/17  Yes [provider]  venlafaxine XR (EFFEXOR-XR) 75 MG 24 hr capsule Take 1 capsule (75 mg total) by mouth daily with breakfast. Patient taking differently: Take 150 mg by mouth daily with breakfast.  11/03/16  Yes Oneta Rack, NP  Vitamin D, Ergocalciferol, (DRISDOL) 50000 units CAPS capsule TAKE 1 CAPSULE BY MOUTH WEEKLY  PT TO TAKE VITAMIN D 2000 UNITS OVER THE COUNTER DAILY AFTER FINISHING 12TH DOSE 03/15/17  Yes [provider]  zolpidem (AMBIEN) 5 MG tablet Take 1 tablet (5 mg total) by mouth at bedtime as needed for sleep. 11/02/16  Yes Oneta Rack, NP  naproxen (NAPROSYN) 500 MG tablet Take 1 tablet (500 mg total) by mouth 2 (two) times daily. 05/17/17   Bethann Berkshire, MD    Family History Family History  Problem Relation Age of Onset  . Hypertension Mother   . Diabetes Mother   . Heart disease Father   .  Sleep apnea Father   . Heart disease Sister   . Heart disease Brother   . Seizures Paternal Grandmother     Social History Social History   Tobacco Use  . Smoking status: Never Smoker  . Smokeless tobacco: Never Used  Substance Use Topics  . Alcohol use: Yes    Comment: occ  . Drug use: No     Allergies   Hydrocodone; Caffeine; and Lactose intolerance (gi)   Review of Systems Review of Systems  Constitutional: Negative for appetite change and fatigue.  HENT: Negative for congestion, ear discharge and sinus pressure.   Eyes: Negative for discharge.  Respiratory: Negative for cough.   Cardiovascular: Negative for chest pain.  Gastrointestinal: Negative for abdominal pain  and diarrhea.  Genitourinary: Positive for flank pain. Negative for frequency and hematuria.  Musculoskeletal: Negative for back pain.  Skin: Negative for rash.  Neurological: Negative for seizures and headaches.  Psychiatric/Behavioral: Negative for hallucinations.     Physical Exam Updated Vital Signs BP 135/74 (BP Location: Right Arm)   Pulse 79   Temp 97.7 F (36.5 C) (Oral)   Resp 16   Ht 5' 2.5" (1.588 m)   Wt 90.7 kg (200 lb)   LMP 05/12/2017   SpO2 100%   BMI 36.00 kg/m   Physical Exam  Constitutional: She is oriented to person, place, and time. She appears well-developed.  HENT:  Head: Normocephalic.  Eyes: Conjunctivae and EOM are normal. No scleral icterus.  Neck: Neck supple. No thyromegaly present.  Cardiovascular: Normal rate and regular rhythm. Exam reveals no gallop and no friction rub.  No murmur heard. Pulmonary/Chest: No stridor. She has no wheezes. She has no rales. She exhibits no tenderness.  Abdominal: She exhibits no distension. There is no tenderness. There is no rebound.  Musculoskeletal: Normal range of motion. She exhibits no edema.  Tender right lumbar muscles.  And positive straight leg raise on the right  Lymphadenopathy:    She has no cervical adenopathy.  Neurological: She is alert and oriented to person, place, and time. She displays normal reflexes. No cranial nerve deficit or sensory deficit. She exhibits normal muscle tone. Coordination normal.  Skin: No rash noted. No erythema.  Psychiatric: She has a normal mood and affect. Her behavior is normal.     ED Treatments / Results  Labs (all labs ordered are listed, but only abnormal results are displayed) Labs Reviewed  URINALYSIS, ROUTINE W REFLEX MICROSCOPIC - Abnormal; Notable for the following components:      Result Value   APPearance HAZY (*)    Protein, ur 30 (*)    Bacteria, UA RARE (*)    Squamous Epithelial / LPF 0-5 (*)    All other components within normal limits    PREGNANCY, URINE    EKG  EKG Interpretation None       Radiology Dg Lumbar Spine Complete  Result Date: 05/17/2017 CLINICAL DATA:  Low back pain. EXAM: LUMBAR SPINE - COMPLETE 4+ VIEW COMPARISON:  None. FINDINGS: There is no evidence of lumbar spine fracture. Alignment is normal. Intervertebral disc spaces are maintained. IMPRESSION: Negative. Electronically Signed   By: Signa Kellaylor  Stroud M.D.   On: 05/17/2017 21:17    Procedures Procedures (including critical care time)  Medications Ordered in ED Medications  HYDROmorphone (DILAUDID) injection 1 mg (1 mg Intramuscular Given 05/17/17 2010)  ondansetron (ZOFRAN-ODT) disintegrating tablet 4 mg (4 mg Oral Given 05/17/17 2011)     Initial Impression / Assessment and Plan /  ED Course  I have reviewed the triage vital signs and the nursing notes.  Pertinent labs & imaging results that were available during my care of the patient were reviewed by me and considered in my medical decision making (see chart for details).     Patient with lumbar strain and sciatica.  She will be placed on Naprosyn and is taking Ultracet also.  She will will rest at home 2 days and follow-up with her PCP if not improving  Final Clinical Impressions(s) / ED Diagnoses   Final diagnoses:  None    ED Discharge Orders        Ordered    naproxen (NAPROSYN) 500 MG tablet  2 times daily     05/17/17 2131       Bethann BerkshireZammit, Aubrianna Orchard, MD 05/17/17 2136

## 2017-05-17 NOTE — ED Triage Notes (Addendum)
Pt reports lower right sided flank pain. Pt reports her back started hurting last night and she began taking motrin. Pt states she worked today but is having difficulty raising her right leg. Pt reports having dark urine as well. Pt denies dysuria.

## 2017-05-17 NOTE — Discharge Instructions (Signed)
Rest at home for a couple days.  Take your Ultracet for discomfort also take the Naprosyn.  Follow-up with Dr. Phillips OdorGolding next week if not improving

## 2017-06-03 MED FILL — TOPIRAMATE 100 MG TABLET: 100 | 30 days supply | Qty: 60 | Fill #2

## 2017-06-21 DIAGNOSIS — F329 Major depressive disorder, single episode, unspecified: Secondary | ICD-10-CM | POA: Diagnosis not present

## 2017-06-21 MED FILL — VENLAFAXINE HCL ER 225 MG T: 225 | 30 days supply | Qty: 30 | Fill #0

## 2017-06-25 DIAGNOSIS — F329 Major depressive disorder, single episode, unspecified: Secondary | ICD-10-CM | POA: Diagnosis not present

## 2017-07-16 MED FILL — TOPIRAMATE 100 MG TABLET: 100 | 30 days supply | Qty: 60 | Fill #3

## 2017-07-16 MED FILL — VENLAFAXINE HCL ER 225 MG T: 225 | 30 days supply | Qty: 30 | Fill #1

## 2017-08-02 DIAGNOSIS — F329 Major depressive disorder, single episode, unspecified: Secondary | ICD-10-CM | POA: Diagnosis not present

## 2017-08-02 MED FILL — ARIPiprazole 2 MG TABS: 2 | 30 days supply | Qty: 30 | Fill #0

## 2017-08-02 MED FILL — ALPRAZolam 1 MG TABS: 1 | 30 days supply | Qty: 60 | Fill #0

## 2017-08-12 DIAGNOSIS — F329 Major depressive disorder, single episode, unspecified: Secondary | ICD-10-CM | POA: Diagnosis not present

## 2017-08-19 DIAGNOSIS — F329 Major depressive disorder, single episode, unspecified: Secondary | ICD-10-CM | POA: Diagnosis not present

## 2017-08-19 MED FILL — PROAIR HFA 90 MCG INHALER: 108 (90 BAS | 25 days supply | Qty: 9 | Fill #0

## 2017-08-19 MED FILL — ELETRIPTAN HYDROBROMIDE 40: 40 | 30 days supply | Qty: 12 | Fill #1

## 2017-08-19 MED FILL — VENLAFAXINE HCL ER 225 MG T: 225 | 90 days supply | Qty: 90 | Fill #0

## 2017-08-19 MED FILL — TOPIRAMATE 100 MG TABLET: 100 | 30 days supply | Qty: 60 | Fill #4

## 2017-08-19 MED FILL — MONTELUKAST SOD 10 MG TAB: 10 | 90 days supply | Qty: 90 | Fill #1

## 2017-08-25 ENCOUNTER — Encounter (HOSPITAL_COMMUNITY): Payer: Self-pay | Admitting: Emergency Medicine

## 2017-08-25 ENCOUNTER — Other Ambulatory Visit: Payer: Self-pay

## 2017-08-25 ENCOUNTER — Ambulatory Visit (HOSPITAL_COMMUNITY)
Admission: EM | Admit: 2017-08-25 | Discharge: 2017-08-25 | Disposition: A | Discharge status: Home / Self Care | Payer: 59 | Attending: Internal Medicine | Admitting: Internal Medicine

## 2017-08-25 DIAGNOSIS — G43909 Migraine, unspecified, not intractable, without status migrainosus: Secondary | ICD-10-CM

## 2017-08-25 DIAGNOSIS — R110 Nausea: Secondary | ICD-10-CM

## 2017-08-25 MED ORDER — ONDANSETRON 4 MG PO TBDP
4.0000 mg | ORAL_TABLET | Freq: Once | ORAL | Status: AC
  Administered 2017-08-25: 4 mg via ORAL

## 2017-08-25 MED ORDER — ONDANSETRON 4 MG PO TBDP
ORAL_TABLET | ORAL | Status: AC
  Filled 2017-08-25: qty 1

## 2017-08-25 MED ORDER — KETOROLAC TROMETHAMINE 60 MG/2ML IM SOLN
60.0000 mg | Freq: Once | INTRAMUSCULAR | Status: AC
  Administered 2017-08-25: 60 mg via INTRAMUSCULAR

## 2017-08-25 MED ORDER — DEXAMETHASONE SODIUM PHOSPHATE 10 MG/ML IJ SOLN
INTRAMUSCULAR | Status: AC
  Filled 2017-08-25: qty 1

## 2017-08-25 MED ORDER — KETOROLAC TROMETHAMINE 60 MG/2ML IM SOLN
INTRAMUSCULAR | Status: AC
  Filled 2017-08-25: qty 2

## 2017-08-25 MED ORDER — DEXAMETHASONE SODIUM PHOSPHATE 10 MG/ML IJ SOLN
10.0000 mg | Freq: Once | INTRAMUSCULAR | Status: AC
  Administered 2017-08-25: 10 mg via INTRAMUSCULAR

## 2017-08-25 NOTE — ED Triage Notes (Signed)
Headache pain is behind eyes, moving from behind right eye to behind the left eye.  Patient has tried her prescribed medicines, and pain has improved, but not gone away at all.  Pain is continuous, but fluctuates in severity.  Patient has some nausea

## 2017-08-25 NOTE — ED Provider Notes (Signed)
[facility]    CSN: [id number] Arrival date & time: 08/25/17  1647     History   Chief Complaint Chief Complaint  Patient presents with  . Headache    HPI Shannon Gould is a 42 y.o. female.   Shannon Gould presents with complaints of migraine headache which started two days ago. States that caffeine tends to trigger migraines and she did drink half a cup of tea Friday. Took Relpack which did help some. Pain returned yesterday so took relpack and Ultracet x2. Today symptoms persist, has not taken any additional medication. Headache is off and on. Vision is blurred. Light and sound worsens headache. mvoement does not. Pain is behind eyes, started to behind right and now to left. Feels similar to previous migraines she has had. States that it has been a while since she has had such a severe headache. Typically relpack helps. She gets a migraine approximately once a month and has headaches twice a week. Nausea without vomiting. Denies head injury or dizziness. Hx of asthma, depression, headache. She does take daily topamax as well.    ROS per HPI.      Past Medical History:  Diagnosis Date  . Anemia    hx of patient reports   . Anxiety   . Asthma   . Depression   . Head ache     Patient Active Problem List   Diagnosis Date Noted  . Major depressive disorder, 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/31/2016    Past Surgical History:  Procedure Laterality Date  . CHOLECYSTECTOMY    . transvaginal mesh      OB History   None      Home Medications    Prior to Admission medications   Medication Sig Start Date End Date Taking? Authorizing Provider  ARIPiprazole (ABILIFY) 2 MG tablet Take 2 mg by mouth daily.   Yes [provider]  albuterol (PROVENTIL HFA) 108 (90 BASE) MCG/ACT inhaler Inhale 2 puffs into the lungs every 6 (six) hours as needed. For shortness of breath     [provider]  ALPRAZolam (XANAX) 0.5 MG tablet Take  0.5 mg by mouth 2 (two) times daily as needed for anxiety.    [provider]  eletriptan (RELPAX) 40 MG tablet Take 40 mg by mouth as needed for migraine or headache. May repeat in 2 hours if headache persists or recurs.    [provider]  hydrOXYzine (ATARAX/VISTARIL) 50 MG tablet Take 1 tablet (50 mg total) by mouth at bedtime and may repeat dose one time if needed. 11/02/16   Oneta Rack, NP  montelukast (SINGULAIR) 10 MG tablet Take 10 mg by mouth daily. 03/01/17   [provider]  naproxen (NAPROSYN) 500 MG tablet Take 1 tablet (500 mg total) by mouth 2 (two) times daily. 05/17/17   Bethann Berkshire, MD  topiramate (TOPAMAX) 100 MG tablet Take 1 tablet (100 mg total) by mouth 2 (two) times daily. 11/02/16   Oneta Rack, NP  traMADol-acetaminophen (ULTRACET) 37.5-325 MG tablet TAKE 1 2 TABLETS BY MOUTH 3 TIMES A DAY AS NEEDED 04/09/17   [provider]  venlafaxine XR (EFFEXOR-XR) 75 MG 24 hr capsule Take 1 capsule (75 mg total) by mouth daily with breakfast. Patient taking differently: Take 225 mg by mouth daily with breakfast.  11/03/16   Oneta Rack, NP  Vitamin D, Ergocalciferol, (DRISDOL) 50000 units CAPS capsule TAKE 1 CAPSULE BY MOUTH WEEKLY  PT TO TAKE  VITAMIN D 2000 UNITS OVER THE COUNTER DAILY AFTER FINISHING 12TH DOSE 03/15/17   [provider]  zolpidem (AMBIEN) 5 MG tablet Take 1 tablet (5 mg total) by mouth at bedtime as needed for sleep. 11/02/16   Oneta RackLewis, Tanika N, NP    Family History Family History  Problem Relation Age of Onset  . Hypertension Mother   . Diabetes Mother   . Heart disease Father   . Sleep apnea Father   . Heart disease Sister   . Heart disease Brother   . Seizures Paternal Grandmother     Social History Social History   Tobacco Use  . Smoking status: Never Smoker  . Smokeless tobacco: Never Used  Substance Use Topics  . Alcohol use: Yes    Comment: occ  . Drug use: No     Allergies     Hydrocodone; Caffeine; and Lactose intolerance (gi)   Review of Systems Review of Systems   Physical Exam Triage Vital Signs ED Triage Vitals  Enc Vitals Group     BP 08/25/17 1725 129/70     Pulse Rate 08/25/17 1725 95     Resp 08/25/17 1725 18     Temp 08/25/17 1725 98.5 F (36.9 C)     Temp Source 08/25/17 1725 Oral     SpO2 08/25/17 1725 100 %     Weight --      Height --      Head Circumference --      Peak Flow --      Pain Score 08/25/17 1721 4     Pain Loc --      Pain Edu? --      Excl. in GC? --    No data found.  Updated Vital Signs BP 129/70 (BP Location: Left Arm)   Pulse 95   Temp 98.5 F (36.9 C) (Oral)   Resp 18   SpO2 100%   Visual Acuity Right Eye Distance:   Left Eye Distance:   Bilateral Distance:    Right Eye Near:   Left Eye Near:    Bilateral Near:     Physical Exam  Constitutional: She is oriented to person, place, and time. She appears well-developed and well-nourished. No distress.  HENT:  Head: Normocephalic and atraumatic.  Eyes: Pupils are equal, round, and reactive to light. EOM are normal.  Neck: Normal range of motion.  Cardiovascular: Normal rate, regular rhythm and normal heart sounds.  Pulmonary/Chest: Effort normal and breath sounds normal.  Neurological: She is alert and oriented to person, place, and time. She has normal strength. She is not disoriented. No cranial nerve deficit or sensory deficit. GCS eye subscore is 4. GCS verbal subscore is 5. GCS motor subscore is 6.  Skin: Skin is warm and dry.  Psychiatric: She has a normal mood and affect.     UC Treatments / Results  Labs (all labs ordered are listed, but only abnormal results are displayed) Labs Reviewed - No data to display  EKG None Radiology No results found.  Procedures Procedures (including critical care time)  Medications Ordered in UC Medications  ketorolac (TORADOL) injection 60 mg (60 mg Intramuscular Given 08/25/17 1748)   dexamethasone (DECADRON) injection 10 mg (10 mg Intramuscular Given 08/25/17 1748)  ondansetron (ZOFRAN-ODT) disintegrating tablet 4 mg (4 mg Oral Given 08/25/17 1748)     Initial Impression / Assessment and Plan / UC Course  I have reviewed the triage vital signs and the nursing notes.  Pertinent  labs & imaging results that were available during my care of the patient were reviewed by me and considered in my medical decision making (see chart for details).     Toradol, decadron, zofran provided in clinic today. Without acute neurological findings on exam. Continue with prescribed regimen of medications as needed. Return precautions provided. Patient verbalized understanding and agreeable to plan.  Ambulatory out of clinic without difficulty.    Final Clinical Impressions(s) / UC Diagnoses   Final diagnoses:  Migraine without status migrainosus, not intractable, unspecified migraine type    ED Discharge Orders    None       Controlled Substance Prescriptions [place] Controlled Substance Registry consulted? Not Applicable   Georgetta Haber, NP 08/25/17 1816

## 2017-08-30 DIAGNOSIS — F329 Major depressive disorder, single episode, unspecified: Secondary | ICD-10-CM | POA: Diagnosis not present

## 2017-08-31 LAB — POCT GLYCOSYLATED HEMOGLOBIN (HGB A1C)

## 2017-09-03 MED FILL — ARIPiprazole 2 MG TABS: 2 | 30 days supply | Qty: 30 | Fill #1

## 2017-09-09 DIAGNOSIS — Z124 Encounter for screening for malignant neoplasm of cervix: Secondary | ICD-10-CM | POA: Diagnosis not present

## 2017-09-09 DIAGNOSIS — N393 Stress incontinence (female) (male): Secondary | ICD-10-CM | POA: Diagnosis not present

## 2017-09-09 DIAGNOSIS — Z01419 Encounter for gynecological examination (general) (routine) without abnormal findings: Secondary | ICD-10-CM | POA: Diagnosis not present

## 2017-09-13 DIAGNOSIS — F329 Major depressive disorder, single episode, unspecified: Secondary | ICD-10-CM | POA: Diagnosis not present

## 2017-09-16 DIAGNOSIS — F329 Major depressive disorder, single episode, unspecified: Secondary | ICD-10-CM | POA: Diagnosis not present

## 2017-09-16 MED FILL — ALPRAZolam 1 MG TABS: 1 | 30 days supply | Qty: 60 | Fill #1

## 2017-09-25 MED FILL — ZALEPLON 10 MG CAPSULE: 10 | 30 days supply | Qty: 60 | Fill #0

## 2017-09-30 DIAGNOSIS — F329 Major depressive disorder, single episode, unspecified: Secondary | ICD-10-CM | POA: Diagnosis not present

## 2017-10-08 MED FILL — ARIPiprazole 2 MG TABS: 2 | 90 days supply | Qty: 90 | Fill #0

## 2017-10-08 MED FILL — TOPIRAMATE 100 MG TABLET: 100 | 30 days supply | Qty: 60 | Fill #5

## 2017-10-11 DIAGNOSIS — E782 Mixed hyperlipidemia: Secondary | ICD-10-CM | POA: Diagnosis not present

## 2017-10-25 DIAGNOSIS — F329 Major depressive disorder, single episode, unspecified: Secondary | ICD-10-CM | POA: Diagnosis not present

## 2017-11-13 DIAGNOSIS — Z6833 Body mass index (BMI) 33.0-33.9, adult: Secondary | ICD-10-CM | POA: Diagnosis not present

## 2017-11-13 DIAGNOSIS — B9689 Other specified bacterial agents as the cause of diseases classified elsewhere: Secondary | ICD-10-CM | POA: Diagnosis not present

## 2017-11-13 DIAGNOSIS — R339 Retention of urine, unspecified: Secondary | ICD-10-CM | POA: Diagnosis not present

## 2017-11-13 DIAGNOSIS — E6609 Other obesity due to excess calories: Secondary | ICD-10-CM | POA: Diagnosis not present

## 2017-11-13 DIAGNOSIS — N393 Stress incontinence (female) (male): Secondary | ICD-10-CM | POA: Diagnosis not present

## 2017-11-13 DIAGNOSIS — Z1389 Encounter for screening for other disorder: Secondary | ICD-10-CM | POA: Diagnosis not present

## 2017-11-13 DIAGNOSIS — N760 Acute vaginitis: Secondary | ICD-10-CM | POA: Diagnosis not present

## 2017-11-13 DIAGNOSIS — G43009 Migraine without aura, not intractable, without status migrainosus: Secondary | ICD-10-CM | POA: Diagnosis not present

## 2017-11-13 MED FILL — metroNIDAZOLE 500 MG TABS: 500 | 7 days supply | Qty: 14 | Fill #0

## 2017-11-13 MED FILL — ONDANSETRON ODT 4 MG TABLET: 4 | 20 days supply | Qty: 60 | Fill #0

## 2017-11-13 MED FILL — RIZATRIPTAN 10 MG ODT: 10 | 30 days supply | Qty: 12 | Fill #0

## 2017-11-18 MED FILL — TOPIRAMATE 100 MG TABLET: 100 | 30 days supply | Qty: 60 | Fill #6

## 2017-11-18 MED FILL — ZALEPLON 10 MG CAPSULE: 10 | 30 days supply | Qty: 60 | Fill #1

## 2017-11-18 MED FILL — VENLAFAXINE HCL ER 225 MG T: 225 | 90 days supply | Qty: 90 | Fill #1

## 2017-11-18 MED FILL — MONTELUKAST SOD 10 MG TAB: 10 | 90 days supply | Qty: 90 | Fill #2

## 2017-12-02 DIAGNOSIS — F329 Major depressive disorder, single episode, unspecified: Secondary | ICD-10-CM | POA: Diagnosis not present

## 2018-01-01 DIAGNOSIS — N393 Stress incontinence (female) (male): Secondary | ICD-10-CM | POA: Diagnosis not present

## 2018-01-01 DIAGNOSIS — N3281 Overactive bladder: Secondary | ICD-10-CM | POA: Diagnosis not present

## 2018-01-01 DIAGNOSIS — N398 Other specified disorders of urinary system: Secondary | ICD-10-CM | POA: Diagnosis not present

## 2018-01-01 MED FILL — TOPIRAMATE 100 MG TABLET: 100 | 30 days supply | Qty: 60 | Fill #7

## 2018-01-15 MED FILL — PROAIR HFA 90 MCG INHALER: 108 (90 BAS | 25 days supply | Qty: 9 | Fill #1

## 2018-01-17 ENCOUNTER — Encounter: Payer: Self-pay | Admitting: Neurology

## 2018-01-21 MED FILL — ARIPiprazole 2 MG TABS: 2 | 90 days supply | Qty: 90 | Fill #1

## 2018-02-11 DIAGNOSIS — F329 Major depressive disorder, single episode, unspecified: Secondary | ICD-10-CM | POA: Diagnosis not present

## 2018-02-12 MED FILL — TOPIRAMATE 100 MG TABLET: 100 | 30 days supply | Qty: 60 | Fill #8

## 2018-02-17 DIAGNOSIS — N398 Other specified disorders of urinary system: Secondary | ICD-10-CM | POA: Diagnosis not present

## 2018-02-17 DIAGNOSIS — R339 Retention of urine, unspecified: Secondary | ICD-10-CM | POA: Diagnosis not present

## 2018-02-17 DIAGNOSIS — Z960 Presence of urogenital implants: Secondary | ICD-10-CM | POA: Diagnosis not present

## 2018-02-17 HISTORY — PX: REMOVAL OF URINARY SLING: SHX6218

## 2018-03-12 ENCOUNTER — Other Ambulatory Visit: Payer: Self-pay | Admitting: Physician Assistant

## 2018-03-12 MED FILL — TOPIRAMATE 100 MG TABLET: 100 | 30 days supply | Qty: 60 | Fill #0

## 2018-03-12 NOTE — Progress Notes (Deleted)
NEUROLOGY CONSULTATION NOTE  Shannon Gould MRN: [id number] DOB: 1975-10-20  Referring provider: Assunta Found, MD Primary care provider: Assunta Found, MD  Reason for consult:  migraines  HISTORY OF PRESENT ILLNESS: Shannon Gould is a 42 year old female with depression, anxiety and asthma who presents for migraines.  History supplemented by ED and referring provider's notes.  Onset:  *** Location:  *** Quality:  *** Intensity:  ***.  *** denies new headache, thunderclap headache or severe headache that wakes *** from sleep. Aura:  *** Prodrome:  *** Postdrome:  *** Associated symptoms:  ***.  *** denies associated unilateral numbness or weakness. Duration:  *** Frequency:  *** Frequency of abortive medication: *** Triggers:  *** Exacerbating factors:  *** Relieving factors:  *** Activity:  ***  Current NSAIDS:  Naproxen 500mg  Current analgesics:  Ultracet Current triptans:  Relpax 40mg  Current ergotamine:  *** Current anti-emetic:  *** Current muscle relaxants:  *** Current anti-anxiolytic:  Alprazolam, hydroxyzine Current sleep aide:  Ambien Current Antihypertensive medications:  *** Current Antidepressant medications:  Venlafaxine XR 225mg  daily Current Anticonvulsant medications:  topiramate 100mg  twice daily Current anti-CGRP:  *** Current Vitamins/Herbal/Supplements:  *** Current Antihistamines/Decongestants:  Singulair Other therapy:  *** Other medication:  ***  Past NSAIDS:  *** Past analgesics:  *** Past abortive triptans:  Maxalt  Past abortive ergotamine:  *** Past muscle relaxants:  *** Past anti-emetic:  Zofran 4mg  Past antihypertensive medications:  *** Past antidepressant medications:  Sertraline 300mg  daily, Celexa, Wellbutrin Past anticonvulsant medications:  *** Past anti-CGRP:  *** Past vitamins/Herbal/Supplements:  *** Past antihistamines/decongestants:  *** Other past therapies:  ***  Caffeine:  *** Alcohol:  *** Smoker:   *** Diet:  *** Exercise:  *** Depression:  ***; Anxiety:  *** Other pain:  *** Sleep hygiene:  *** Family history of headache:  ***  CT Head without contrast from 01/25/15 personally reviewed and was unremarkable.  PAST MEDICAL HISTORY: Past Medical History:  Diagnosis Date  . Anemia    hx of patient reports   . Anxiety   . Asthma   . Depression   . Head ache     PAST SURGICAL HISTORY: Past Surgical History:  Procedure Laterality Date  . CHOLECYSTECTOMY    . transvaginal mesh      MEDICATIONS: Current Outpatient Medications on File Prior to Visit  Medication Sig Dispense Refill  . albuterol (PROVENTIL HFA) 108 (90 BASE) MCG/ACT inhaler Inhale 2 puffs into the lungs every 6 (six) hours as needed. For shortness of breath     . ALPRAZolam (XANAX) 0.5 MG tablet Take 0.5 mg by mouth 2 (two) times daily as needed for anxiety.    . ARIPiprazole (ABILIFY) 2 MG tablet Take 2 mg by mouth daily.    Marland Kitchen eletriptan (RELPAX) 40 MG tablet Take 40 mg by mouth as needed for migraine or headache. May repeat in 2 hours if headache persists or recurs.    . hydrOXYzine (ATARAX/VISTARIL) 50 MG tablet Take 1 tablet (50 mg total) by mouth at bedtime and may repeat dose one time if needed. 30 tablet 0  . montelukast (SINGULAIR) 10 MG tablet Take 10 mg by mouth daily.  11  . naproxen (NAPROSYN) 500 MG tablet Take 1 tablet (500 mg total) by mouth 2 (two) times daily. 30 tablet 0  . topiramate (TOPAMAX) 100 MG tablet Take 1 tablet (100 mg total) by mouth 2 (two) times daily. 60 tablet 0  . traMADol-acetaminophen (ULTRACET) 37.5-325 MG tablet TAKE 1  2 TABLETS BY MOUTH 3 TIMES A DAY AS NEEDED  1  . venlafaxine XR (EFFEXOR-XR) 75 MG 24 hr capsule Take 1 capsule (75 mg total) by mouth daily with breakfast. (Patient taking differently: Take 225 mg by mouth daily with breakfast. ) 30 capsule 0  . Vitamin D, Ergocalciferol, (DRISDOL) 50000 units CAPS capsule TAKE 1 CAPSULE BY MOUTH WEEKLY  PT TO TAKE VITAMIN D  2000 UNITS OVER THE COUNTER DAILY AFTER FINISHING 12TH DOSE  0  . zolpidem (AMBIEN) 5 MG tablet Take 1 tablet (5 mg total) by mouth at bedtime as needed for sleep. 10 tablet 0   No current facility-administered medications on file prior to visit.     ALLERGIES: Allergies  Allergen Reactions  . Hydrocodone Itching  . Caffeine Other (See Comments)    Headache   . Lactose Intolerance (Gi) Other (See Comments)    gas    FAMILY HISTORY: Family History  Problem Relation Age of Onset  . Hypertension Mother   . Diabetes Mother   . Heart disease Father   . Sleep apnea Father   . Heart disease Sister   . Heart disease Brother   . Seizures Paternal Grandmother    ***.  SOCIAL HISTORY: Social History   Socioeconomic History  . Marital status: Married    Spouse name: Not on file  . Number of children: Not on file  . Years of education: Not on file  . Highest education level: Not on file  Occupational History  . Not on file  Social Needs  . Financial resource strain: Not on file  . Food insecurity:    Worry: Not on file    Inability: Not on file  . Transportation needs:    Medical: Not on file    Non-medical: Not on file  Tobacco Use  . Smoking status: Never Smoker  . Smokeless tobacco: Never Used  Substance and Sexual Activity  . Alcohol use: Yes    Comment: occ  . Drug use: No  . Sexual activity: Yes    Birth control/protection: IUD  Lifestyle  . Physical activity:    Days per week: Not on file    Minutes per session: Not on file  . Stress: Not on file  Relationships  . Social connections:    Talks on phone: Not on file    Gets together: Not on file    Attends religious service: Not on file    Active member of club or organization: Not on file    Attends meetings of clubs or organizations: Not on file    Relationship status: Not on file  . Intimate partner violence:    Fear of current or ex partner: Not on file    Emotionally abused: Not on file     Physically abused: Not on file    Forced sexual activity: Not on file  Other Topics Concern  . Not on file  Social History Narrative  . Not on file    REVIEW OF SYSTEMS: Constitutional: No fevers, chills, or sweats, no generalized fatigue, change in appetite Eyes: No visual changes, double vision, eye pain Ear, nose and throat: No hearing loss, ear pain, nasal congestion, sore throat Cardiovascular: No chest pain, palpitations Respiratory:  No shortness of breath at rest or with exertion, wheezes GastrointestinaI: No nausea, vomiting, diarrhea, abdominal pain, fecal incontinence Genitourinary:  No dysuria, urinary retention or frequency Musculoskeletal:  No neck pain, back pain Integumentary: No rash, pruritus, skin lesions Neurological: as  above Psychiatric: No depression, insomnia, anxiety Endocrine: No palpitations, fatigue, diaphoresis, mood swings, change in appetite, change in weight, increased thirst Hematologic/Lymphatic:  No purpura, petechiae. Allergic/Immunologic: no itchy/runny eyes, nasal congestion, recent allergic reactions, rashes  PHYSICAL EXAM: *** General: No acute distress.  Patient appears ***-groomed.  *** Head:  Normocephalic/atraumatic Eyes:  fundi examined but not visualized Neck: supple, no paraspinal tenderness, full range of motion Back: No paraspinal tenderness Heart: regular rate and rhythm Lungs: Clear to auscultation bilaterally. Vascular: No carotid bruits. Neurological Exam: Mental status: alert and oriented to person, place, and time, recent and remote memory intact, fund of knowledge intact, attention and concentration intact, speech fluent and not dysarthric, language intact. Cranial nerves: CN I: not tested CN II: pupils equal, round and reactive to light, visual fields intact CN III, IV, VI:  full range of motion, no nystagmus, no ptosis CN V: facial sensation intact CN VII: upper and lower face symmetric CN VIII: hearing intact CN IX,  X: gag intact, uvula midline CN XI: sternocleidomastoid and trapezius muscles intact CN XII: tongue midline Bulk & Tone: normal, no fasciculations. Motor:  5/5 throughout *** Sensation:  Pinprick *** temperature *** and vibration sensation intact.  ***. Deep Tendon Reflexes:  2+ throughout, *** toes downgoing.  *** Finger to nose testing:  Without dysmetria.  *** Heel to shin:  Without dysmetria.  *** Gait:  Normal station and stride.  Able to turn and tandem walk. Romberg ***.  IMPRESSION: ***  PLAN: ***  Thank you for allowing me to take part in the care of this patient.  Shon Millet, DO  CC: ***

## 2018-03-12 NOTE — Telephone Encounter (Signed)
I found how to send it to you     Thanks

## 2018-03-14 ENCOUNTER — Ambulatory Visit: Payer: Self-pay | Admitting: Neurology

## 2018-03-14 ENCOUNTER — Encounter

## 2018-03-28 ENCOUNTER — Other Ambulatory Visit: Payer: Self-pay | Admitting: Physician Assistant

## 2018-03-28 ENCOUNTER — Ambulatory Visit: Payer: 59 | Admitting: Psychiatry

## 2018-03-28 ENCOUNTER — Other Ambulatory Visit: Payer: Self-pay

## 2018-03-28 DIAGNOSIS — F331 Major depressive disorder, recurrent, moderate: Secondary | ICD-10-CM | POA: Insufficient documentation

## 2018-03-28 MED ORDER — VENLAFAXINE HCL ER 75 MG PO CP24: 225.0000 mg | ORAL_CAPSULE | Freq: Every day | ORAL | 0 refills | Status: DC

## 2018-03-28 NOTE — Progress Notes (Signed)
      [facility] Counselor/Therapist Progress Note   Patient ID: Shannon Gould, MRN: [id number]  Date: 03/28/2018  Timespent: 45 minutes  Treatment Type: Individual  Subjective: ( Patient has requested extra privacy protection for her chart as she is a [employer] employee.)  Patient in today further processing emotions re: ongoing tense  family situation which has just recently begun to calm down some.  Some depressed mood, frustration, and fatigue.  Feeling some better with recent improvements and "settlement" in family siituation.  Emphasized self-care to patient and being able to ask for help from others when needed.  Interventions:Solution Focused, Strength-based, Supportive and Reframing  Mental Status Exam:   Appearance:   Casual     Behavior:  Appropriate  Motor:  Normal  Speech/Language:   Normal Rate  Affect:  Congruent  Mood:  anxious and depressed  Thought process:  Relevant  Thought content:    Logical  Perceptual disturbances:    Normal  Orientation:  Full (Time, Place, and Person)  Attention:  Good  Concentration:  good  Memory:  Immediate  Fund of knowledge:   Good  Insight:    Good  Judgment:   Good  Impulse Control:  good    Reported Symptoms:   Some depressed mood, fatigue, frustrated  Risk Assessment: Danger to Self:  No Self-injurious Behavior: No Danger to Others: No Duty to Warn:no Physical Aggression / Violence:No  Access to Firearms a concern: No  Gang Involvement:No   Diagnosis:   ICD-10-CM   1. Major depressive disorder, recurrent episode, moderate (HCC) F33.1      Plan:  Will see patient in approx. 3 weeks for continued goal-directed treatment.  Mathis Fare, LCSW

## 2018-03-31 ENCOUNTER — Other Ambulatory Visit: Payer: Self-pay

## 2018-03-31 MED FILL — VENLAFAXINE HCL ER 225 MG T: 225 | 30 days supply | Qty: 30 | Fill #0

## 2018-04-04 DIAGNOSIS — F0393 Unspecified dementia, unspecified severity, with mood disturbance: Secondary | ICD-10-CM | POA: Insufficient documentation

## 2018-04-04 DIAGNOSIS — F329 Major depressive disorder, single episode, unspecified: Principal | ICD-10-CM

## 2018-04-04 DIAGNOSIS — G4700 Insomnia, unspecified: Secondary | ICD-10-CM

## 2018-04-04 DIAGNOSIS — F411 Generalized anxiety disorder: Secondary | ICD-10-CM | POA: Insufficient documentation

## 2018-04-04 DIAGNOSIS — F0390 Unspecified dementia without behavioral disturbance: Secondary | ICD-10-CM | POA: Insufficient documentation

## 2018-04-21 ENCOUNTER — Ambulatory Visit: Payer: 59 | Admitting: Physician Assistant

## 2018-04-21 ENCOUNTER — Encounter: Payer: Self-pay | Admitting: Physician Assistant

## 2018-04-21 DIAGNOSIS — F331 Major depressive disorder, recurrent, moderate: Secondary | ICD-10-CM | POA: Diagnosis not present

## 2018-04-21 DIAGNOSIS — F411 Generalized anxiety disorder: Secondary | ICD-10-CM | POA: Diagnosis not present

## 2018-04-21 DIAGNOSIS — G4700 Insomnia, unspecified: Secondary | ICD-10-CM | POA: Diagnosis not present

## 2018-04-21 MED ORDER — ZALEPLON 10 MG PO CAPS: 10.0000 mg | ORAL_CAPSULE | Freq: Every evening | ORAL | 5 refills | Status: DC | PRN

## 2018-04-21 MED ORDER — VENLAFAXINE HCL ER 225 MG PO TB24: 225.0000 mg | ORAL_TABLET | Freq: Every day | ORAL | 1 refills | Status: DC

## 2018-04-21 MED ORDER — ARIPIPRAZOLE 2 MG PO TABS: 2.0000 mg | ORAL_TABLET | ORAL | 1 refills | Status: DC

## 2018-04-21 MED FILL — ZALEPLON 10 MG CAPSULE: 10 | 30 days supply | Qty: 60 | Fill #0

## 2018-04-21 MED FILL — ARIPiprazole 2 MG TABS: 2 | 90 days supply | Qty: 90 | Fill #0

## 2018-04-21 NOTE — Progress Notes (Signed)
[facility] Med Check  Patient ID: Shannon Gould,  MRN: [id number]  PCP: Assunta FoundGolding, John, MD  Date of Evaluation: 04/21/2018 Time spent:15 minutes  Chief Complaint:  Chief Complaint    Follow-up      HISTORY/CURRENT STATUS: HPI Doing well overall.  Is divorced now and happier.   Patient denies loss of interest in usual activities and is able to enjoy things.  Denies decreased energy or motivation.  Appetite has not changed.  No extreme sadness, tearfulness, or feelings of hopelessness.  Denies any changes in concentration, making decisions or remembering things.  Denies suicidal or homicidal thoughts.  Still has insomnia unless she takes the sonata or uses a weighted blanket. Does use her electronic devices shortly before she goes to bed.  She is considering getting amber-colored glasses which we have discussed before.  Work is going pretty good.  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  She is a nursing cardiology.  States that sometimes the doctors have complained, but her supervisor has not said anything.  Individual Medical History/ Review of Systems: Changes? :No    Past medications for mental health diagnoses include: Effexor, Vistaril, Xanax, Ambien, Zoloft, Wellbutrin which caused increased agitation and suicidal ideations, trazodone, Abilify, Sonata  Allergies: Hydrocodone; Wellbutrin [bupropion]; Caffeine; and Lactose intolerance (gi)  Current Medications:  Current Outpatient Medications:  .  albuterol (PROVENTIL HFA) 108 (90 BASE) MCG/ACT inhaler, Inhale 2 puffs into the lungs every 6 (six) hours as needed. For shortness of breath , Disp: , Rfl:  .  ARIPiprazole (ABILIFY) 2 MG tablet, Take 2 mg by mouth every morning. , Disp: , Rfl:  .  eletriptan (RELPAX) 40 MG tablet, Take 40 mg by mouth as needed for migraine or headache. May repeat in 2 hours if headache persists or recurs., Disp: , Rfl:  .  montelukast (SINGULAIR)  10 MG tablet, Take 10 mg by mouth daily., Disp: , Rfl: 11 .  topiramate (TOPAMAX) 100 MG tablet, Take 1 tablet (100 mg total) by mouth 2 (two) times daily., Disp: 60 tablet, Rfl: 0 .  traMADol-acetaminophen (ULTRACET) 37.5-325 MG tablet, TAKE 1 2 TABLETS BY MOUTH 3 TIMES A DAY AS NEEDED, Disp: , Rfl: 1 .  Vitamin D, Ergocalciferol, (DRISDOL) 50000 units CAPS capsule, TAKE 1 CAPSULE BY MOUTH WEEKLY  PT TO TAKE VITAMIN D 2000 UNITS OVER THE COUNTER DAILY AFTER FINISHING 12TH DOSE, Disp: , Rfl: 0 .  zaleplon (SONATA) 10 MG capsule, Take 10 mg by mouth at bedtime as needed for sleep (1/2 - 1 tab & 1/2 - 1 prn MNA)., Disp: , Rfl:  .  ALPRAZolam (XANAX) 1 MG tablet, Take 1 mg by mouth 2 (two) times daily as needed for anxiety (1/2 - 1 tab). , Disp: , Rfl:  Medication Side Effects: none  Family Medical/ Social History: Changes? Yes Divorce is final. "Good thing."  Moved into a new home and is happy.  Kids live with her but spend 3 weekends a month with her dad.  That is working out pretty well.  MENTAL HEALTH EXAM:  There were no vitals taken for this visit.There is no height or weight on file to calculate BMI.  General Appearance: Casual  Eye Contact:  Good  Speech:  Clear and Coherent  Volume:  Normal  Mood:  Euthymic  Affect:  Appropriate  Thought Process:  Goal Directed  Orientation:  Full (Time, Place, and Person)  Thought Content: Logical   Suicidal Thoughts:  No  Homicidal Thoughts:  No  Memory:  WNL  Judgement:  Good  Insight:  Good  Psychomotor Activity:  Normal  Concentration:  Concentration: Good  Recall:  Good  Fund of Knowledge: Good  Language: Good  Assets:  Desire for Improvement  ADL's:  Intact  Cognition: WNL  Prognosis:  Good    DIAGNOSES:    ICD-10-CM   1. Major depressive disorder, recurrent episode, moderate (HCC) F33.1   2. Insomnia, unspecified type G47.00   3. GAD (generalized anxiety disorder) F41.1     Receiving Psychotherapy: Yes with Rockne Menghini,  LCSW   RECOMMENDATIONS: We discussed sleep hygiene again.  Recommend amber-colored glasses especially she is going to be on her computer within a couple hours she needs to go to sleep. Continue all current medications as noted above. Continue psychotherapy with Rockne Menghini, LCSW. Return in 6 months or sooner as needed.   Melony Overly, PA-C

## 2018-04-25 MED FILL — OXYBUTYNIN CL ER 10 MG TAB: 10 | 30 days supply | Qty: 30 | Fill #0

## 2018-05-06 ENCOUNTER — Ambulatory Visit: Payer: 59 | Admitting: Psychiatry

## 2018-05-06 DIAGNOSIS — F331 Major depressive disorder, recurrent, moderate: Secondary | ICD-10-CM | POA: Diagnosis not present

## 2018-05-06 NOTE — Progress Notes (Signed)
      [facility] Counselor/Therapist Progress Note  Patient ID: Shannon Gould, MRN: [id number],    Date: 05/06/2018  Time Spent:   30 minutes (patient was running late for appt today)  Treatment Type: Individual Therapy  Reported Symptoms: Depressed mood and Anxious Mood  Mental Status Exam:  Appearance:   Casual     Behavior:  Appropriate and Sharing  Motor:  Normal  Speech/Language:   Normal Rate  Affect:  Congruent  Mood:  anxious and depressed  Thought process:  normal  Thought content:    WNL  Sensory/Perceptual disturbances:    WNL  Orientation:  oriented to person, place, time/date, situation, day of week, month of year and year  Attention:  Good  Concentration:  Good  Memory:  WNL  Fund of knowledge:   Good  Insight:    Good  Judgment:   Good  Impulse Control:  Good   Risk Assessment: Danger to Self:  No Self-injurious Behavior: No Danger to Others: No Duty to Warn:no Physical Aggression / Violence:No  Access to Firearms a concern: No  Gang Involvement:No   Subjective:  Patient arrived late today so session was adjusted to 30 minute session.  Focused on priority of patient which related to her being a healthy co-parent of her 3 children with divorced husband.  Also processed a work issue that is currently going on for her.  Helped her with a couple of strategies that could help her at work.  Interventions: Solution-Oriented/Positive Psychology  Diagnosis:   ICD-10-CM   1. Major depressive disorder, recurrent episode, moderate (HCC) F33.1     Plan: Patient to follow up on strategies reviewed in session, especially in regards to her work situation.  Has follow up appt in 2-3 weeks.  Shannon Fareeborah Talita Recht, LCSW

## 2018-05-19 MED FILL — VENLAFAXINE HCL ER 225 MG T: 225 | 90 days supply | Qty: 90 | Fill #0

## 2018-05-19 NOTE — Progress Notes (Signed)
NEUROLOGY CONSULTATION NOTE  Shannon Gould MRN: [id number] DOB: 11-Jun-1975  Referring provider: Assunta FoundJohn Golding, MD Primary care provider: Assunta FoundJohn Golding, MD  Reason for consult:  migraines  HISTORY OF PRESENT ILLNESS: Shannon Gould is a 42 year old left-handed female with generalized anxiety disorder and major depression who presents for migraines.  History supplemented by counselor's note.  Onset:  20s Location:  Usually left sided Quality:  pounding Intensity:  6/10.  She denies new headache, thunderclap headache or severe headache that wakes her from sleep. Aura:  no Prodrome:  no Postdrome:  no Associated symptoms:  Nausea, photophobia, phonophobia, blurred vision, left eye droops, osmophobia.  She denies associated vomiting or unilateral numbness or weakness. Duration:  1 day Frequency:  Once a month Frequency of abortive medication: once or twice a month Triggers:  caffeine Relieving factors:  Laying down Activity:  Aggravates She also has near daily dull bifrontal headaches which she treats with ibuprofen 600mg .  CT of head from 01/25/2015 was personally reviewed and revealed no acute intracranial abnormalities.  Rescue protocol:  At home, she goes to lay down first.  If persists after several hours, will take Relpax.  Then repeats Relpax with Ultracet.  May take with Zofran if nausea.  .   Current NSAIDS:  Ibuprofen 600mg  Current analgesics: Ultracet Current triptans: Relpax 40 mg Current ergotamine: None Current anti-emetic:  none Current muscle relaxants: None Current anti-anxiolytic: Xanax Current sleep aide: Sonata Current Antihypertensive medications: None Current Antidepressant medications: Venlafaxine XL 225 mg daily, Abilify Current Anticonvulsant medications: Topiramate 100 mg twice daily (causes drowsiness and difficulty concentrating) Current anti-CGRP: None Current Vitamins/Herbal/Supplements: Vitamin D Current Antihistamines/Decongestants:  None Other therapy: None Hormone/birth control: IUD  Past NSAIDS: Ibuprofen, naproxen Past analgesics:  Tylenol, Excedrin Past abortive triptans: Maxalt 5 mg, Sumatriptan tablet (caused nausea) Past abortive ergotamine: none Past muscle relaxants:  none Past anti-emetic:  none Past antihypertensive medications:  none Past antidepressant medications: Wellbutrin, Celexa, Zoloft 100 mg Past anticonvulsant medications:  none Past anti-CGRP:  none Past vitamins/Herbal/Supplements:  none Past antihistamines/decongestants:  none Other past therapies:  none  Caffeine:  no Alcohol:  occasional Smoker:  no Diet:  hydrates Exercise:  no Depression:  yes; Anxiety:  Yes.  She sees a Veterinary surgeoncounselor for therapy. Other pain:  Suboccipital pain Sleep hygiene:  Insomnia.  Uses sonata and weighted blanket.  PAST MEDICAL HISTORY: Past Medical History:  Diagnosis Date  . Anemia    hx of patient reports   . Anxiety   . Asthma   . Depression   . Head ache     PAST SURGICAL HISTORY: Past Surgical History:  Procedure Laterality Date  . CHOLECYSTECTOMY    . transvaginal mesh      MEDICATIONS: Current Outpatient Medications on File Prior to Visit  Medication Sig Dispense Refill  . albuterol (PROVENTIL HFA) 108 (90 BASE) MCG/ACT inhaler Inhale 2 puffs into the lungs every 6 (six) hours as needed. For shortness of breath     . ALPRAZolam (XANAX) 1 MG tablet Take 1 mg by mouth 2 (two) times daily as needed for anxiety (1/2 - 1 tab).     . ARIPiprazole (ABILIFY) 2 MG tablet Take 1 tablet (2 mg total) by mouth every morning. 90 tablet 1  . eletriptan (RELPAX) 40 MG tablet Take 40 mg by mouth as needed for migraine or headache. May repeat in 2 hours if headache persists or recurs.    . montelukast (SINGULAIR) 10 MG tablet Take 10 mg by  mouth daily.  11  . topiramate (TOPAMAX) 100 MG tablet Take 1 tablet (100 mg total) by mouth 2 (two) times daily. 60 tablet 0  . traMADol-acetaminophen (ULTRACET)  37.5-325 MG tablet TAKE 1 2 TABLETS BY MOUTH 3 TIMES A DAY AS NEEDED  1  . Venlafaxine HCl 225 MG TB24 Take 1 tablet (225 mg total) by mouth daily. 90 each 1  . Vitamin D, Ergocalciferol, (DRISDOL) 50000 units CAPS capsule TAKE 1 CAPSULE BY MOUTH WEEKLY  PT TO TAKE VITAMIN D 2000 UNITS OVER THE COUNTER DAILY AFTER FINISHING 12TH DOSE  0  . zaleplon (SONATA) 10 MG capsule Take 1 capsule (10 mg total) by mouth at bedtime as needed for sleep (1/2 - 1 tab & 1/2 - 1 prn MNA). 60 capsule 5   No current facility-administered medications on file prior to visit.     ALLERGIES: Allergies  Allergen Reactions  . Hydrocodone Itching  . Wellbutrin [Bupropion]   . Caffeine Other (See Comments)    Headache   . Lactose Intolerance (Gi) Other (See Comments)    gas    FAMILY HISTORY: Family History  Problem Relation Age of Onset  . Hypertension Mother   . Diabetes Mother   . Heart disease Father   . Sleep apnea Father   . Heart disease Sister   . Heart disease Brother   . Seizures Paternal Grandmother     SOCIAL HISTORY: Social History   Socioeconomic History  . Marital status: Married    Spouse name: Not on file  . Number of children: Not on file  . Years of education: Not on file  . Highest education level: Not on file  Occupational History  . Not on file  Social Needs  . Financial resource strain: Not on file  . Food insecurity:    Worry: Not on file    Inability: Not on file  . Transportation needs:    Medical: Not on file    Non-medical: Not on file  Tobacco Use  . Smoking status: Never Smoker  . Smokeless tobacco: Never Used  Substance and Sexual Activity  . Alcohol use: Yes    Comment: occ  . Drug use: No  . Sexual activity: Yes    Birth control/protection: I.U.D.  Lifestyle  . Physical activity:    Days per week: Not on file    Minutes per session: Not on file  . Stress: Not on file  Relationships  . Social connections:    Talks on phone: Not on file    Gets  together: Not on file    Attends religious service: Not on file    Active member of club or organization: Not on file    Attends meetings of clubs or organizations: Not on file    Relationship status: Not on file  . Intimate partner violence:    Fear of current or ex partner: Not on file    Emotionally abused: Not on file    Physically abused: Not on file    Forced sexual activity: Not on file  Other Topics Concern  . Not on file  Social History Narrative  . Not on file    REVIEW OF SYSTEMS: Constitutional: No fevers, chills, or sweats, no generalized fatigue, change in appetite Eyes: No visual changes, double vision, eye pain Ear, nose and throat: No hearing loss, ear pain, nasal congestion, sore throat Cardiovascular: No chest pain, palpitations Respiratory:  No shortness of breath at rest or with exertion, wheezes  GastrointestinaI: No nausea, vomiting, diarrhea, abdominal pain, fecal incontinence Genitourinary:  No dysuria, urinary retention or frequency Musculoskeletal:  Neck pain Integumentary: No rash, pruritus, skin lesions Neurological: as above Psychiatric: depression, insomnia, anxiety Endocrine: No palpitations, fatigue, diaphoresis, mood swings, change in appetite, change in weight, increased thirst Hematologic/Lymphatic:  No purpura, petechiae. Allergic/Immunologic: no itchy/runny eyes, nasal congestion, recent allergic reactions, rashes  PHYSICAL EXAM: Blood pressure 108/68, pulse 91, height 5\' 3"  (1.6 m), weight 207 lb (93.9 kg), SpO2 98 %. General: No acute distress.  Patient appears well-groomed.   Head:  Normocephalic/atraumatic Eyes:  fundi examined but not visualized Neck: supple, no paraspinal tenderness, full range of motion Back: No paraspinal tenderness Heart: regular rate and rhythm Lungs: Clear to auscultation bilaterally. Vascular: No carotid bruits. Neurological Exam: Mental status: alert and oriented to person, place, and time, recent and remote  memory intact, fund of knowledge intact, attention and concentration intact, speech fluent and not dysarthric, language intact. Cranial nerves: CN I: not tested CN II: pupils equal, round and reactive to light, visual fields intact CN III, IV, VI:  full range of motion, no nystagmus, no ptosis CN V: facial sensation intact CN VII: upper and lower face symmetric CN VIII: hearing intact CN IX, X: gag intact, uvula midline CN XI: sternocleidomastoid and trapezius muscles intact CN XII: tongue midline Bulk & Tone: normal, no fasciculations. Motor:  5/5 throughout  Sensation:  temperature and vibration sensation intact.  Deep Tendon Reflexes:  2+ throughout, toes downgoing.   Finger to nose testing:  Without dysmetria.   Heel to shin:  Without dysmetria.   Gait:  Normal station and stride.  Able to turn and tandem walk. Romberg negative.  IMPRESSION: Chronic migraine without aura, without status migrainosus, not intractable  PLAN: 1.  Migraines improved when venlafaxine was added.  Due to side effects, she will taper off of topiramate.  She will continue venlafaxine as per psychiatry 2.  Start Aimovig 70mg  monthly 3.  Stop Ultracet.  For abortive therapy, she will take Relpax earliest onset of migraine and may repeat dose in 2 hours if needed.  Zofran for nausea.  For mild dull headaches, may take ibuprofen 4.  Limit use of pain relievers to no more than 2 days out of week to prevent risk of rebound or medication-overuse headache. 5.  Keep headache diary 6.  Exercise 7.  Follow up in 4 months.  Thank you for allowing me to take part in the care of this patient.  Shon Millet, DO  CC: Assunta Found, MD

## 2018-05-20 ENCOUNTER — Ambulatory Visit: Payer: 59 | Admitting: Neurology

## 2018-05-20 ENCOUNTER — Encounter: Payer: Self-pay | Admitting: Neurology

## 2018-05-20 VITALS — BP 108/68 | HR 91 | Ht 63.0 in | Wt 207.0 lb

## 2018-05-20 DIAGNOSIS — G43709 Chronic migraine without aura, not intractable, without status migrainosus: Secondary | ICD-10-CM | POA: Diagnosis not present

## 2018-05-20 MED ORDER — ERENUMAB-AOOE 70 MG/ML ~~LOC~~ SOAJ
70.0000 mg | SUBCUTANEOUS | 11 refills | Status: DC
Start: 2018-05-20 — End: 2019-05-22

## 2018-05-20 MED ORDER — ERENUMAB-AOOE 70 MG/ML ~~LOC~~ SOAJ: 70.0000 mg | Freq: Once | SUBCUTANEOUS | 0 refills | Status: AC

## 2018-05-20 NOTE — Patient Instructions (Signed)
1.  We will taper off of topiramate.  Take 50mg  twice daily for 1 week, then 50mg  at bedtime for 1 week, then stop. 2.  Instead, we will start Aimovig 70mg  monthly. 3.  Continue venlafaxine as it helps with migraines as well. 4.  Stop Ultracet.  At earliest onset of migraine take Relpax right away.  May repeat in 2 hours if needed, not to exceed 2 tablets in 24 hours. 5.  Limit use of pain relievers to no more than 2 days out of week to prevent risk of rebound or medication-overuse headache. 6.  Keep headache diary 7.  Follow up in 4 months.

## 2018-05-30 ENCOUNTER — Telehealth: Payer: Self-pay | Admitting: Neurology

## 2018-05-30 NOTE — Telephone Encounter (Signed)
Shannon Gould called needing a mediation prior Authorization. The Reference # is [id number]. Please Call. Thanks

## 2018-06-05 MED FILL — AIMOVIG 70 MG/ML SOAJ: 70 | 30 days supply | Qty: 1 | Fill #0

## 2018-06-05 NOTE — Telephone Encounter (Signed)
PA has been approved through 11/28/2018

## 2018-06-12 ENCOUNTER — Ambulatory Visit: Payer: 59 | Admitting: Psychiatry

## 2018-06-12 DIAGNOSIS — F331 Major depressive disorder, recurrent, moderate: Secondary | ICD-10-CM | POA: Diagnosis not present

## 2018-06-12 NOTE — Progress Notes (Signed)
      [facility] Counselor/Therapist Progress Note  Patient ID: Shannon Gould, MRN: [id number],    Date: 06/12/2018  Time Spent:  58 minutes  Treatment Type: Individual Therapy  Reported Symptoms:  Anxiety, depression,frustration, decreased sleep  Mental Status Exam:  Appearance:   Casual     Behavior:  Sharing and Rigid  Motor:  Normal  Speech/Language:   Normal Rate  Affect:  Depressed  Mood:  anxious and depressed  Thought process:  normal  Thought content:    WNL  Sensory/Perceptual disturbances:    WNL  Orientation:  oriented to person, place, time/date, situation, day of week, month of year and year  Attention:  Good  Concentration:  Good  Memory:  WNL  Fund of knowledge:   Good  Insight:    Fair  Judgment:   Good  Impulse Control:  Fair   Risk Assessment: Danger to Self:  No Self-injurious Behavior: No Danger to Others: No Duty to Warn:no Physical Aggression / Violence:No  Access to Firearms a concern: No  Gang Involvement:No   Subjective: Patient depressed, decreased sleep, anxious, and frustrated.  Still working through her feelings regarding her divorce.  Feels alone at times with her children as dad has cut back his visitations with them.  Daughter continues to have some behavioral health issues and is being seen in [place] [facility] where they live.  Therapist that was seeing daughter died recently so patient is to meet with her daughter and daughter's psychiatrist and will be scheduled with another therapist.  Patient having trouble sleeping and staying asleep.  States she's going to try and take her Xanax before bedtime as she has not tried it yet. Has come off of Topamax per her neurologist.  Worked on some specific strategies (CBT) that may help patient  with her depression and in co-parenting with ex-husband. Does have good supportive friends.  Reports things are better at work, interpersonally and in task completion.     Interventions: Cognitive Behavioral  Therapy  Diagnosis:   ICD-10-CM   1. Major depressive disorder, recurrent episode, moderate (HCC) F33.1     Plan:  Patient to practice different strategies mentioned above to help her manage depression, anxiety, as well as improve her self care.  To return in approx 2-3 wk.  Mathis Fare, LCSW

## 2018-07-04 ENCOUNTER — Ambulatory Visit: Payer: 59 | Admitting: Psychiatry

## 2018-07-16 ENCOUNTER — Other Ambulatory Visit (HOSPITAL_COMMUNITY): Payer: Self-pay | Admitting: Physician Assistant

## 2018-07-16 ENCOUNTER — Ambulatory Visit (HOSPITAL_COMMUNITY)
Admission: RE | Admit: 2018-07-16 | Discharge: 2018-07-16 | Disposition: A | Discharge status: Home / Self Care | Payer: 59 | Source: Ambulatory Visit | Attending: Physician Assistant | Admitting: Physician Assistant

## 2018-07-16 DIAGNOSIS — Z6834 Body mass index (BMI) 34.0-34.9, adult: Secondary | ICD-10-CM | POA: Diagnosis not present

## 2018-07-16 DIAGNOSIS — E6609 Other obesity due to excess calories: Secondary | ICD-10-CM | POA: Diagnosis not present

## 2018-07-16 DIAGNOSIS — R0789 Other chest pain: Secondary | ICD-10-CM | POA: Diagnosis not present

## 2018-07-16 DIAGNOSIS — R079 Chest pain, unspecified: Secondary | ICD-10-CM | POA: Diagnosis not present

## 2018-07-16 DIAGNOSIS — R609 Edema, unspecified: Secondary | ICD-10-CM | POA: Diagnosis not present

## 2018-07-16 MED FILL — AIMOVIG 70 MG/ML SOAJ: 70 | 30 days supply | Qty: 1 | Fill #1

## 2018-07-25 ENCOUNTER — Ambulatory Visit: Payer: 59 | Admitting: Psychiatry

## 2018-07-25 DIAGNOSIS — F331 Major depressive disorder, recurrent, moderate: Secondary | ICD-10-CM

## 2018-07-25 NOTE — Progress Notes (Signed)
      [facility] Counselor/Therapist Progress Note  Patient ID: Shannon Gould, MRN: [id number],    Date: 07/25/2018  Time Spent:  58 minutes  Treatment Type: Individual Therapy  Reported Symptoms:  Anxiety, depression (much improved) decreased sleep  Mental Status Exam:  Appearance:   Casual     Behavior:  Sharing  Motor:  Normal  Speech/Language:   Normal Rate  Affect:  Depressed  Mood:  anxious  Thought process:  normal  Thought content:    WNL  Sensory/Perceptual disturbances:    WNL  Orientation:  oriented to person, place, time/date, situation, day of week, month of year and year  Attention:  Good  Concentration:  Good  Memory:  WNL  Fund of knowledge:   Good  Insight:    Good  Judgment:   Good  Impulse Control:  Good   Risk Assessment: Danger to Self:  No Self-injurious Behavior: No Danger to Others: No Duty to Warn:no Physical Aggression / Violence:No  Access to Firearms a concern: No  Gang Involvement:No   Subjective: Depression has decreased and anxiety has increased.  Family issues due to divorce and trying to work with ex-spouse to support 3 kids.   Work is gradually improving some and she feels is managing situations there better.  Issues with teens at home and other family members---communication and boundary issues.  Is making some progress with work and family issues, sleep is better and frustration is less.   Worked on some specific strategies (CBT) that may help patient  with her anxiety and in co-parenting with ex-husband. Does have good supportive friends and her mom is supportive as well.      Interventions: Cognitive Behavioral Therapy  Diagnosis:   ICD-10-CM   1. Major depressive disorder, recurrent episode, moderate (HCC) F33.1     Plan:  Patient to practice different strategies mentioned above to help her manage depression, anxiety, as well as improve her self care.  To return in approx 2-3 wk.  Mathis Fare,  LCSW

## 2018-07-29 ENCOUNTER — Ambulatory Visit: Payer: 59 | Admitting: Cardiology

## 2018-07-29 ENCOUNTER — Encounter: Payer: Self-pay | Admitting: Cardiology

## 2018-07-29 VITALS — BP 116/58 | HR 75 | Ht 62.0 in | Wt 210.6 lb

## 2018-07-29 DIAGNOSIS — R079 Chest pain, unspecified: Secondary | ICD-10-CM | POA: Diagnosis not present

## 2018-07-29 DIAGNOSIS — K219 Gastro-esophageal reflux disease without esophagitis: Secondary | ICD-10-CM | POA: Insufficient documentation

## 2018-07-29 DIAGNOSIS — Z6838 Body mass index (BMI) 38.0-38.9, adult: Secondary | ICD-10-CM

## 2018-07-29 DIAGNOSIS — Z0189 Encounter for other specified special examinations: Secondary | ICD-10-CM

## 2018-07-29 DIAGNOSIS — Z79899 Other long term (current) drug therapy: Secondary | ICD-10-CM | POA: Diagnosis not present

## 2018-07-29 DIAGNOSIS — E669 Obesity, unspecified: Secondary | ICD-10-CM | POA: Diagnosis not present

## 2018-07-29 NOTE — Progress Notes (Signed)
Cardiology Office Note:    Date:  07/29/2018   ID:  Shannon Gould, DOB 12/31/1975, MRN [id number]  PCP:  Assunta Found, MD  Cardiologist:  Jodelle Red, MD PhD  Referring MD: Shawnie Dapper, PA-C   CC: chest discomfort  History of Present Illness:    Shannon Gould is a 43 y.o. female with a hx of depression, anxiety who is seen as a new consult at the request of Samuella Bruin for the evaluation and management of chest pain/palpitations.  Chest pain/palpitations: -Initial onset: early February -Quality: pressure, like a cramp -Frequency: happening several times/day; had one day when it was very painful overnight -Duration: severe episode lasted about 2 hours, usually last a few minutes -Associated symptoms: no nausea, SOB, diaphoresis, lightheadedness. Does move down left arm on occasion -Aggravating/alleviating factors: no clear. Not better with tums or Xanax -Prior cardiac history: none -Prior ECG/workup: stress echo >10 years ago at [facility], no further testing -Prior treatment: none -Alcohol: drinks about 1x/month -Tobacco: never -Comorbidities: no history of diabetes, high blood pressure, no history of high cholesterol -Exercise level: no intentional exercise, but can climb stairs, etc -Cardiac ROS: no shortness of breath, no PND, no orthopnea, no LE edema, no syncope -Family history: father has had 2 Mis, has three stents. First was in his late 40s/early 12s. Mother and father, as well as sister and brother, have high blood pressure. [name] had both MI and stroke.   Past Medical History:  Diagnosis Date  . Anemia    hx of patient reports   . Anxiety   . Asthma   . Depression   . GERD (gastroesophageal reflux disease)   . Head ache     Past Surgical History:  Procedure Laterality Date  . CHOLECYSTECTOMY    . transvaginal mesh      Current Medications: Current Outpatient Medications on File Prior to Visit  Medication Sig  .  albuterol (PROVENTIL HFA) 108 (90 BASE) MCG/ACT inhaler Inhale 2 puffs into the lungs every 6 (six) hours as needed. For shortness of breath   . ALPRAZolam (XANAX) 1 MG tablet Take 1 mg by mouth 2 (two) times daily as needed for anxiety (1/2 - 1 tab).   . ARIPiprazole (ABILIFY) 2 MG tablet Take 1 tablet (2 mg total) by mouth every morning.  . eletriptan (RELPAX) 40 MG tablet Take 40 mg by mouth as needed for migraine or headache. May repeat in 2 hours if headache persists or recurs.  Dorise Hiss (AIMOVIG) 70 MG/ML SOAJ Inject 70 mg into the skin every 30 (thirty) days.  . montelukast (SINGULAIR) 10 MG tablet Take 10 mg by mouth daily.  . Venlafaxine HCl 225 MG TB24 Take 1 tablet (225 mg total) by mouth daily.  . zaleplon (SONATA) 10 MG capsule Take 1 capsule (10 mg total) by mouth at bedtime as needed for sleep (1/2 - 1 tab & 1/2 - 1 prn MNA).   No current facility-administered medications on file prior to visit.      Allergies:   Hydrocodone; Wellbutrin [bupropion]; Caffeine; and Lactose intolerance (gi)   Social History   Socioeconomic History  . Marital status: Single    Spouse name: Not on file  . Number of children: 3  . Years of education: Not on file  . Highest education level: Associate degree: occupational, Scientist, product/process development, or vocational program  Occupational History  . Occupation: LPN    Employer: [employer]  Social Needs  .  Financial resource strain: Not on file  . Food insecurity:    Worry: Not on file    Inability: Not on file  . Transportation needs:    Medical: Not on file    Non-medical: Not on file  Tobacco Use  . Smoking status: Never Smoker  . Smokeless tobacco: Never Used  Substance and Sexual Activity  . Alcohol use: Yes    Comment: occ  . Drug use: No  . Sexual activity: Yes    Birth control/protection: I.U.D.  Lifestyle  . Physical activity:    Days per week: Not on file    Minutes per session: Not on file  . Stress: Not on file    Relationships  . Social connections:    Talks on phone: Not on file    Gets together: Not on file    Attends religious service: Not on file    Active member of club or organization: Not on file    Attends meetings of clubs or organizations: Not on file    Relationship status: Not on file  Other Topics Concern  . Not on file  Social History Narrative   Patient is left-handed. She lives in a one level home with 2 of her children, one is at college. She avoids caffeine. She does not exercise.     Family History: The patient's family history includes Anemia in her sister; Anxiety disorder in her daughter; Asthma in her daughter, son, and son; Depression in her daughter; Diabetes in her father and mother; Heart attack in her father; Heart disease in her brother, father, and sister; Hypertension in her brother, mother, and sister; Seizures in her paternal grandmother; Sleep apnea in her father.  ROS:   Please see the history of present illness.  Additional pertinent ROS:  Constitutional: Negative for chills, fever, night sweats, unintentional weight loss  HENT: Negative for ear pain and hearing loss.   Eyes: Negative for loss of vision and eye pain.  Respiratory: Negative for cough, sputum, shortness of breath, wheezing.   Cardiovascular: See HPI. Gastrointestinal: Negative for abdominal pain, melena. Occasional mild hematochezia.  Genitourinary: Negative for dysuria and hematuria.  Musculoskeletal: Negative for falls and myalgias.  Skin: Negative for itching and rash.  Neurological: Negative for focal weakness, focal sensory chanzzxges and loss of consciousness.  Endo/Heme/Allergies: Does bruise/bleed easily.    EKGs/Labs/Other Studies Reviewed:    The following studies were reviewed today: Notes from PCP  EKG:  EKG is personally reviewed.  The ekg ordered today demonstrates normal sinus rhythm  Recent Labs: No results found for requested labs within last 8760 hours.  Recent  Lipid Panel No results found for: CHOL, TRIG, HDL, CHOLHDL, VLDL, LDLCALC, LDLDIRECT  Normal CMP on notes No record of lipids or A1c  Physical Exam:    VS:  BP (!) 116/58 (BP Location: Left Arm)   Pulse 75   Ht 5\' 2"  (1.575 m)   Wt 210 lb 9.6 oz (95.5 kg)   SpO2 98%   BMI 38.52 kg/m     Wt Readings from Last 3 Encounters:  07/29/18 210 lb 9.6 oz (95.5 kg)  10/30/16 198 lb (89.8 kg)  01/25/15 170 lb (77.1 kg)     GEN: Well nourished, well developed in no acute distress HEENT: Normal NECK: No JVD; No carotid bruits LYMPHATICS: No lymphadenopathy CARDIAC: regular rhythm, normal S1 and S2, no murmurs, rubs, gallops. Radial and DP pulses 2+ bilaterally. RESPIRATORY:  Clear to auscultation without rales, wheezing or rhonchi  ABDOMEN: Soft, non-tender, non-distended MUSCULOSKELETAL:  No edema; No deformity  SKIN: Warm and dry NEUROLOGIC:  Alert and oriented x 3 PSYCHIATRIC:  Normal affect   ASSESSMENT:    1. Chest pain, unspecified type   2. Medication management   3. Assessment of effects of psychotropic drug in patient at risk for metabolic syndrome   4. Class 2 obesity without serious comorbidity with body mass index (BMI) of 38.0 to 38.9 in adult, unspecified obesity type    PLAN:    Chest discomfort: has both typical and atypical components. Given age, lack of comorbidities, would be likely low risk. However, lipids and A1c unknown, and she may have increased risk given use of aripiprazole.  -discussed options for evaluation, including treadmill only stress test, CT coronary, or monitoring symptoms. Planned for treadmill only stress test -if stress test abnormal, would consider CT coronary over cath unless there are high risk features  At risk for metabolic syndrome: aripiprazole increases her risk for metabolic syndrome. May also be contributing to obesity, class 2, though likely combination of diet/lifestyle and medication -checking lipids, A1c today -would benefit  from increased activity, weight loss, heart healthy diet in the long term to mitigate increased risk from medications.  -cannot calculate ASCVD risk given no available lipids  Plan for follow up: TBD based on results of testing  Medication Adjustments/Labs and Tests Ordered: Current medicines are reviewed at length with the patient today.  Concerns regarding medicines are outlined above.  Orders Placed This Encounter  Procedures  . Lipid panel  . Hemoglobin A1c  . EXERCISE TOLERANCE TEST (ETT)  . EKG 12-Lead   No orders of the defined types were placed in this encounter.   Patient Instructions  Medication Instructions:  Your Physician recommend you continue on your current medication as directed.    If you need a refill on your cardiac medications before your next appointment, please call your pharmacy.   Lab work: Your physician recommends that you return for lab work today ( lipid, A1C)  If you have labs (blood work) drawn today and your tests are completely normal, you will receive your results only by: Marland Kitchen MyChart Message (if you have MyChart) OR . A paper copy in the mail If you have any lab test that is abnormal or we need to change your treatment, we will call you to review the results.  Testing/Procedures: Your physician has requested that you have an exercise tolerance test. For further information please visit [email or web address] Please also follow instruction sheet, as given. [address]   Follow-Up: At [facility], you and your health needs are our priority.  As part of our continuing mission to provide you with exceptional heart care, we have created designated Provider Care Teams.  These Care Teams include your primary Cardiologist (physician) and Advanced Practice Providers (APPs -  Physician Assistants and Nurse Practitioners) who all work together to provide you with the care you need, when you need it. You will need a follow up appointment  as needed.  Please call our office 2 months in advance to schedule this appointment.  You may see Dr. Cristal Deer or one of the following Advanced Practice Providers on your designated Care Team:   Theodore Demark, PA-C . Joni Reining, DNP, ANP        Signed, Jodelle Red, MD PhD 07/29/2018 12:27 PM    [facility]

## 2018-07-29 NOTE — Patient Instructions (Signed)
Medication Instructions:  Your Physician recommend you continue on your current medication as directed.    If you need a refill on your cardiac medications before your next appointment, please call your pharmacy.   Lab work: Your physician recommends that you return for lab work today ( lipid, A1C)  If you have labs (blood work) drawn today and your tests are completely normal, you will receive your results only by: Marland Kitchen MyChart Message (if you have MyChart) OR . A paper copy in the mail If you have any lab test that is abnormal or we need to change your treatment, we will call you to review the results.  Testing/Procedures: Your physician has requested that you have an exercise tolerance test. For further information please visit [email or web address] Please also follow instruction sheet, as given. [address]   Follow-Up: At [facility], you and your health needs are our priority.  As part of our continuing mission to provide you with exceptional heart care, we have created designated Provider Care Teams.  These Care Teams include your primary Cardiologist (physician) and Advanced Practice Providers (APPs -  Physician Assistants and Nurse Practitioners) who all work together to provide you with the care you need, when you need it. You will need a follow up appointment as needed.  Please call our office 2 months in advance to schedule this appointment.  You may see Dr. Cristal Deer or one of the following Advanced Practice Providers on your designated Care Team:   Theodore Demark, PA-C . Joni Reining, DNP, ANP

## 2018-07-30 LAB — LIPID PANEL
CHOL/HDL RATIO: 3.4 ratio (ref 0.0–4.4)
Cholesterol, Total: 194 mg/dL (ref 100–199)
HDL: 57 mg/dL (ref >39)
LDL Calculated: 126 mg/dL — ABNORMAL HIGH (ref 0–99)
Triglycerides: 55 mg/dL (ref 0–149)
VLDL CHOLESTEROL CAL: 11 mg/dL (ref 5–40)

## 2018-07-30 LAB — HEMOGLOBIN A1C
ESTIMATED AVERAGE GLUCOSE: 108 mg/dL
HEMOGLOBIN A1C: 5.4 % (ref 4.8–5.6)

## 2018-08-01 ENCOUNTER — Ambulatory Visit: Payer: Self-pay | Admitting: Cardiology

## 2018-08-06 ENCOUNTER — Telehealth (HOSPITAL_COMMUNITY): Payer: Self-pay

## 2018-08-06 NOTE — Telephone Encounter (Signed)
Encounter complete. 

## 2018-08-07 ENCOUNTER — Ambulatory Visit (HOSPITAL_COMMUNITY)
Admission: RE | Admit: 2018-08-07 | Discharge: 2018-08-07 | Disposition: A | Discharge status: Home / Self Care | Payer: 59 | Source: Ambulatory Visit | Attending: Internal Medicine | Admitting: Internal Medicine

## 2018-08-07 DIAGNOSIS — R079 Chest pain, unspecified: Secondary | ICD-10-CM

## 2018-08-07 LAB — EXERCISE TOLERANCE TEST
CHL CUP RESTING HR STRESS: 97 {beats}/min
CSEPEW: 7.7 METS
CSEPPHR: 187 {beats}/min
Exercise duration (min): 6 min
Exercise duration (sec): 29 s
MPHR: 180 {beats}/min
Percent HR: 103 %
RPE: 17

## 2018-08-18 ENCOUNTER — Ambulatory Visit: Payer: 59 | Admitting: Psychiatry

## 2018-08-18 ENCOUNTER — Other Ambulatory Visit: Payer: Self-pay

## 2018-08-18 DIAGNOSIS — F331 Major depressive disorder, recurrent, moderate: Secondary | ICD-10-CM | POA: Diagnosis not present

## 2018-08-18 MED FILL — AIMOVIG 70 MG/ML SOAJ: 70 | 30 days supply | Qty: 1 | Fill #2 | Status: TO

## 2018-08-18 NOTE — Progress Notes (Signed)
      [facility] Counselor/Therapist Progress Note  Patient ID: Shannon Gould, MRN: [id number],    Date: 08/18/2018  Time Spent:  53 minutes  Treatment Type: Individual Therapy  Reported Symptoms:  Anxiety, depression (much improved), some difficulty staying asleep  Mental Status Exam:  Appearance:   Casual     Behavior:  Sharing  Motor:  Normal  Speech/Language:   Normal Rate  Affect:  Anxiety  Mood:  Anxious, some depression  Thought process:  normal  Thought content:    WNL  Sensory/Perceptual disturbances:    WNL  Orientation:  oriented to person, place, time/date, situation, day of week, month of year and year  Attention:  Good  Concentration:  Good  Memory:  WNL  Fund of knowledge:   Good  Insight:    Good  Judgment:   Good  Impulse Control:  Good   Risk Assessment: Danger to Self:  No Self-injurious Behavior: No Danger to Others: No Duty to Warn:no Physical Aggression / Violence:No  Access to Firearms a concern: No  Gang Involvement:No   Subjective: Patient in today anxious re: coronavirus issues and how it impacts families.  Concerned for her kids and family.  Processed her anxieties and fears "of what may happen for things to get worse and not being able to provide for the kids."  Discussed this more with patient, especially for her to be able to talk in detail about her fears.  Sleep is better as far as falling asleep but having difficulty staying asleep.  Plans to check with nurse here today on that before she leaves our office today.   Worked on some specific strategies (CBT) that may help patient  with her anxiety and in co-parenting with ex-husband. Ex-husband not seeing the kids right now per his choice, but does call the kids occasionally. Does have good supportive friends and her mom is supportive as well.      Interventions: Cognitive Behavioral Therapy  Diagnosis:   ICD-10-CM   1. Major depressive disorder, recurrent episode, moderate (HCC)  F33.1     Plan:  Patient to practice different strategies mentioned above to continue helping  her manage depression, anxiety, as well as improve her self care.  To return in approx 2-3 wk.  Mathis Fare, LCSW

## 2018-08-22 ENCOUNTER — Other Ambulatory Visit: Payer: Self-pay | Admitting: Physician Assistant

## 2018-08-22 MED FILL — ALPRAZolam 1 MG TABS: 1 | 30 days supply | Qty: 60 | Fill #0

## 2018-08-22 MED FILL — RIZATRIPTAN 10 MG ODT: 10 | 30 days supply | Qty: 12 | Fill #1

## 2018-09-04 MED FILL — VENLAFAXINE HCL ER 225 MG T: 225 | 90 days supply | Qty: 90 | Fill #0

## 2018-09-04 MED FILL — ARIPIPRAZOLE 2 MG TABS: 2 | 90 days supply | Qty: 90 | Fill #0

## 2018-09-11 ENCOUNTER — Ambulatory Visit: Payer: 59 | Admitting: Psychiatry

## 2018-09-15 MED FILL — AIMOVIG 70 MG/ML SOAJ: 70 | 30 days supply | Qty: 1 | Fill #0

## 2018-09-24 MED FILL — OXYBUTYNIN CL ER 10 MG TAB: 10 | 30 days supply | Qty: 30 | Fill #0

## 2018-09-25 MED FILL — PROAIR HFA 90 MCG INHALER: 108 (90 BAS | 17 days supply | Qty: 9 | Fill #0

## 2018-09-25 MED FILL — MONTELUKAST SOD 10 MG TAB: 10 | 90 days supply | Qty: 90 | Fill #0

## 2018-09-30 ENCOUNTER — Ambulatory Visit: Payer: Self-pay | Admitting: Neurology

## 2018-10-20 ENCOUNTER — Other Ambulatory Visit: Payer: Self-pay | Admitting: Physician Assistant

## 2018-10-20 MED FILL — AIMOVIG 70 MG/ML SOAJ: 70 | 30 days supply | Qty: 1 | Fill #1

## 2018-10-21 MED FILL — ZALEPLON 10 MG CAPS: 10 | 60 days supply | Qty: 60 | Fill #0

## 2018-10-21 NOTE — Telephone Encounter (Signed)
Has appt 05/22, other refills expired, she obviously doesn't use it much

## 2018-10-24 ENCOUNTER — Other Ambulatory Visit: Payer: Self-pay

## 2018-10-24 ENCOUNTER — Encounter: Payer: Self-pay | Admitting: Physician Assistant

## 2018-10-24 ENCOUNTER — Ambulatory Visit: Payer: 59 | Admitting: Physician Assistant

## 2018-10-24 DIAGNOSIS — F331 Major depressive disorder, recurrent, moderate: Secondary | ICD-10-CM

## 2018-10-24 DIAGNOSIS — G4700 Insomnia, unspecified: Secondary | ICD-10-CM | POA: Diagnosis not present

## 2018-10-24 DIAGNOSIS — F411 Generalized anxiety disorder: Secondary | ICD-10-CM | POA: Diagnosis not present

## 2018-10-24 MED ORDER — VENLAFAXINE HCL ER 225 MG PO TB24: 225.0000 mg | ORAL_TABLET | Freq: Every day | ORAL | 1 refills | Status: DC

## 2018-10-24 MED ORDER — BREXPIPRAZOLE 0.5 MG PO TABS: 0.5000 mg | ORAL_TABLET | ORAL | 0 refills | Status: DC

## 2018-10-24 MED ORDER — ZOLPIDEM TARTRATE 10 MG PO TABS: 10.0000 mg | ORAL_TABLET | Freq: Every evening | ORAL | 0 refills | Status: DC | PRN

## 2018-10-24 NOTE — Progress Notes (Signed)
[facility] Med Check  Patient ID: Shannon Gould,  MRN: [id number]  PCP: Assunta FoundGolding, John, MD  Date of Evaluation: 10/24/2018 Time spent:15 minutes  Chief Complaint:  Chief Complaint    Follow-up     Virtual Visit via Telephone Note  I connected with patient by a video enabled telemedicine application or telephone, with their informed consent, and verified patient privacy and that I am speaking with the correct person using two identifiers.  I am private, in my home and the patient is home.   I discussed the limitations, risks, security and privacy concerns of performing an evaluation and management service by telephone and the availability of in person appointments. I also discussed with the patient that there may be a patient responsible charge related to this service. The patient expressed understanding and agreed to proceed.   I discussed the assessment and treatment plan with the patient. The patient was provided an opportunity to ask questions and all were answered. The patient agreed with the plan and demonstrated an understanding of the instructions.   The patient was advised to call back or seek an in-person evaluation if the symptoms worsen or if the condition fails to improve as anticipated.  I provided 15  minutes of non-face-to-face time during this encounter.  HISTORY/CURRENT STATUS: HPI for 4140-month med check.  Patient states she is doing really well with her moods.  Denies pression symptoms such as inability to enjoy things, energy and motivation are good, she is not isolating any more than she has to due to the COVID 19 pandemic, she does not cry easily.  Denies suicidal or homicidal thoughts.  Her anxiety is well controlled with the Xanax.  She does not always need that on a daily basis either.  Biggest problem is not being able to sleep for the past several weeks.  It has been really bad the past 3-4 nights, when she is only gotten maybe 4 to 5 hours.  That is with  taking Sonata at bedtime and repeating in the middle of the night.  She is having trouble falling asleep and staying asleep.  She has even tried the Xanax at night instead of the [place] Cross[place]ata and that is not effective either.  She is not staying on her phone late in the evening, at least most of the time, and she does not drink caffeine late in the evening.  Reports about 20 pound weight gain from the Abilify.  It has been very helpful with the depression and she does not want to stop it but wonders if something else could be done.  She is walking for exercise, pretty diligently.  She is working from home right now and does eat more since she works from home.  Denies dizziness, syncope, seizures, numbness, tingling, tremor, tics, unsteady gait, slurred speech, confusion.  Denies muscle or joint pain, stiffness, or dystonia.  Individual Medical History/ Review of Systems: Changes? :No    Past medications for mental health diagnoses include: Effexor, Vistaril, Xanax, Ambien, Zoloft, Wellbutrin which caused increased agitation and suicidal ideations, trazodone, Abilify, Sonata  Allergies: Hydrocodone; Wellbutrin [bupropion]; Caffeine; and Lactose intolerance (gi)  Current Medications:  Current Outpatient Medications:  .  albuterol (PROVENTIL HFA) 108 (90 BASE) MCG/ACT inhaler, Inhale 2 puffs into the lungs every 6 (six) hours as needed. For shortness of breath , Disp: , Rfl:  .  ALPRAZolam (XANAX) 1 MG tablet, TAKE 1/2 TO 1 TABLET BY MOUTH TWICE A DAY AS NEEDED, Disp: 60 tablet, Rfl: 0 .  ARIPiprazole (ABILIFY) 2 MG tablet, Take 1 tablet (2 mg total) by mouth every morning., Disp: 90 tablet, Rfl: 1 .  eletriptan (RELPAX) 40 MG tablet, Take 40 mg by mouth as needed for migraine or headache. May repeat in 2 hours if headache persists or recurs., Disp: , Rfl:  .  Erenumab-aooe (AIMOVIG) 70 MG/ML SOAJ, Inject 70 mg into the skin every 30 (thirty) days., Disp: 1 pen, Rfl: 11 .  montelukast (SINGULAIR) 10 MG  tablet, Take 10 mg by mouth daily., Disp: , Rfl: 11 .  Venlafaxine HCl 225 MG TB24, Take 1 tablet (225 mg total) by mouth daily., Disp: 90 each, Rfl: 1 .  Brexpiprazole (REXULTI) 0.5 MG TABS, Take 1 tablet (0.5 mg total) by mouth every morning., Disp: 30 tablet, Rfl: 0 .  zolpidem (AMBIEN) 10 MG tablet, Take 1 tablet (10 mg total) by mouth at bedtime as needed for up to 30 days for sleep., Disp: 30 tablet, Rfl: 0 Medication Side Effects: none  Family Medical/ Social History: Changes? Yes working from home now d/t coronavirus.  MENTAL HEALTH EXAM:  There were no vitals taken for this visit.There is no height or weight on file to calculate BMI.  General Appearance: unable to assess  Eye Contact:  unable to assess  Speech:  Clear and Coherent  Volume:  Normal  Mood:  Euthymic  Affect:  unable to assess  Thought Process:  Goal Directed  Orientation:  Full (Time, Place, and Person)  Thought Content: Logical   Suicidal Thoughts:  No  Homicidal Thoughts:  No  Memory:  WNL  Judgement:  Good  Insight:  Good  Psychomotor Activity:  unable to assess  Concentration:  Concentration: Good  Recall:  Good  Fund of Knowledge: Good  Language: Good  Assets:  Desire for Improvement  ADL's:  Intact  Cognition: WNL  Prognosis:  Good    DIAGNOSES:    ICD-10-CM   1. Insomnia, unspecified type G47.00   2. Major depressive disorder, recurrent episode, moderate (HCC) F33.1   3. GAD (generalized anxiety disorder) F41.1     Receiving Psychotherapy: No    RECOMMENDATIONS:  Continue Abilify 2 mg p.o. every morning for now.  I am sending in a prescription for Rexulti 0.5 mg and if we are able to get it approved through her insurance we will switch to that.  She understands to stay on the Abilify until that time.  Once the PA is done, she will go online to get a co-pay card or call the office and we can mail her one. DC Sonata. Start Ambien 10 mg half to 1 p.o. nightly.  She has some left over from a  previous description.  It did help her but gave her fatigue the next day.  She will try to take it earlier in the evening so it will not cause drowsiness next morning. Sleep hygiene discussed, Amber colored glasses. Continue Effexor XR 225 mg daily. Continue Xanax 1 mg twice daily as needed. Return in 6 to 8 weeks.   Melony Overly, PA-C   This record has been created using [employer].  Chart creation errors have been sought, but may not always have been located and corrected. Such creation errors do not reflect on the standard of medical care.

## 2018-10-25 MED FILL — REXULTI 0.5 MG TABLET: 0.5 | 30 days supply | Qty: 30 | Fill #0

## 2018-11-07 ENCOUNTER — Telehealth: Payer: 59 | Admitting: Nurse Practitioner

## 2018-11-07 DIAGNOSIS — H1031 Unspecified acute conjunctivitis, right eye: Secondary | ICD-10-CM | POA: Diagnosis not present

## 2018-11-07 MED ORDER — POLYMYXIN B-TRIMETHOPRIM 10000-0.1 UNIT/ML-% OP SOLN: 2.0000 [drp] | OPHTHALMIC | 0 refills | Status: DC

## 2018-11-07 NOTE — Progress Notes (Signed)
We are sorry that you are not feeling well.  Here is how we plan to help!  Based on what you have shared with me it looks like you have conjunctivitis.  Conjunctivitis is a common inflammatory or infectious condition of the eye that is often referred to as "pink eye".  In most cases it is contagious (viral or bacterial). However, not all conjunctivitis requires antibiotics (ex. Allergic).  We have made appropriate suggestions for you based upon your presentation.  I have prescribed Polytrim Ophthalmic drops 1-2 drops 4 times a day times 5 days  Pink eye can be highly contagious.  It is typically spread through direct contact with secretions, or contaminated objects or surfaces that one may have touched.  Strict handwashing is suggested with soap and water is urged.  If not available, use alcohol based had sanitizer.  Avoid unnecessary touching of the eye.  If you wear contact lenses, you will need to refrain from wearing them until you see no white discharge from the eye for at least 24 hours after being on medication.  You should see symptom improvement in 1-2 days after starting the medication regimen.  Call us if symptoms are not improved in 1-2 days.  Home Care:  Wash your hands often!  Do not wear your contacts until you complete your treatment plan.  Avoid sharing towels, bed linen, personal items with a person who has pink eye.  See attention for anyone in your home with similar symptoms.  Get Help Right Away If:  Your symptoms do not improve.  You develop blurred or loss of vision.  Your symptoms worsen (increased discharge, pain or redness)  Your e-visit answers were reviewed by a board certified advanced clinical practitioner to complete your personal care plan.  Depending on the condition, your plan could have included both over the counter or prescription medications.  If there is a problem please reply  once you have received a response from your provider.  Your safety is  important to us.  If you have drug allergies check your prescription carefully.    You can use MyChart to ask questions about today's visit, request a non-urgent call back, or ask for a work or school excuse for 24 hours related to this e-Visit. If it has been greater than 24 hours you will need to follow up with your provider, or enter a new e-Visit to address those concerns.   You will get an e-mail in the next two days asking about your experience.  I hope that your e-visit has been valuable and will speed your recovery. Thank you for using e-visits.   5-10 minutes spent reviewing and documenting in chart.   

## 2018-11-17 MED FILL — AIMOVIG 70 MG/ML SOAJ: 70 | 30 days supply | Qty: 1 | Fill #2

## 2018-11-17 NOTE — Progress Notes (Addendum)
[employer] prior authorization submitted via covermymeds.  Waiting response.  

## 2018-11-20 DIAGNOSIS — G43909 Migraine, unspecified, not intractable, without status migrainosus: Secondary | ICD-10-CM | POA: Diagnosis not present

## 2018-11-20 DIAGNOSIS — R600 Localized edema: Secondary | ICD-10-CM | POA: Diagnosis not present

## 2018-11-20 MED FILL — TOPIRAMATE 100 MG TABLET: 100 | 90 days supply | Qty: 180 | Fill #0

## 2018-11-20 MED FILL — FUROSEMIDE 40 MG TAB: 40 | 60 days supply | Qty: 60 | Fill #0

## 2018-11-20 NOTE — Progress Notes (Signed)
Rcvd fax from [place].  [employer] approved for 12 fills 11/19/18 - 11/18/19 PA reference number: [id number]

## 2018-11-27 ENCOUNTER — Other Ambulatory Visit: Payer: Self-pay

## 2018-11-27 ENCOUNTER — Ambulatory Visit (INDEPENDENT_AMBULATORY_CARE_PROVIDER_SITE_OTHER): Payer: Self-pay | Admitting: Physician Assistant

## 2018-11-27 VITALS — BP 115/65 | HR 95 | Temp 98.5°F | Resp 16 | Wt 219.4 lb

## 2018-11-27 DIAGNOSIS — K0889 Other specified disorders of teeth and supporting structures: Secondary | ICD-10-CM

## 2018-11-27 DIAGNOSIS — R220 Localized swelling, mass and lump, head: Secondary | ICD-10-CM

## 2018-11-27 MED ORDER — IBUPROFEN 800 MG PO TABS: 800.0000 mg | ORAL_TABLET | Freq: Three times a day (TID) | ORAL | 0 refills | Status: DC | PRN

## 2018-11-27 MED ORDER — AMOXICILLIN-POT CLAVULANATE 875-125 MG PO TABS: 1.0000 | ORAL_TABLET | Freq: Two times a day (BID) | ORAL | 0 refills | Status: AC

## 2018-11-27 MED FILL — IBUPROFEN 800 MG TABS: 800 | 10 days supply | Qty: 30 | Fill #0

## 2018-11-27 MED FILL — AMOX-CLAV 875-125 MG TABLET: 875-125 | 7 days supply | Qty: 14 | Fill #0

## 2018-11-27 NOTE — Progress Notes (Signed)
MRN: [id number] DOB: [birth year]  Subjective:   Shannon Gould is a 43 y.o. female presenting for chief complaint of Dental Pain (2 WEEKS, PAIN RIGHT SIDE OF FACE, SWELLING ) .  Reports 2 week history of right sided dental pain. Stated out intermittently, has been persistent for the past 2 days. Has associated gum swelling. Recalls cracking a tooth on the right upper gum about a month ago and did remove some of the pieces. She has not seen a dentist since. Pain is worsened when eating foods. Denies heat/cold insensitivity. Denies fever,chills,diaphoresis, sinus pain, ear pain, N/V/D, sore throat, inability to swallow, voice change, dry cough and myalgia, night sweats, malaise and abdominal pain. Has tried OTC ibuprofen 600mg  with some relief. No known sick contact exposure. Denies PMH of DM, HIV, and MRSA. LMP ~2 weeks ago, has IUD. Denies smoking. Pt does not have a dentist but did call one today and they said they could get her in on 12/15/18. Denies any other aggravating or relieving factors, no other questions or concerns.  ROS Per HPI  Shannon Gould has a current medication list which includes the following prescription(s): albuterol, alprazolam, brexpiprazole, eletriptan, erenumab-aooe, furosemide, ibuprofen, montelukast, oxybutynin, rizatriptan, topiramate, venlafaxine hcl, aripiprazole, trimethoprim-polymyxin b, and zolpidem. Also is allergic to hydrocodone; wellbutrin [bupropion]; caffeine; and lactose intolerance (gi).  Shannon Gould  has a past medical history of Anemia, Anxiety, Asthma, Depression, GERD (gastroesophageal reflux disease), and Head ache. Also  has a past surgical history that includes Cholecystectomy and transvaginal mesh.   Objective:   Vitals: BP 115/65 (BP Location: Right Arm, Patient Position: Sitting, Cuff Size: Normal)   Pulse 95   Temp 98.5 F (36.9 C) (Oral)   Resp 16   Wt 219 lb 6.4 oz (99.5 kg)   SpO2 98%   BMI 40.13 kg/m   Physical Exam Vitals signs reviewed.   Constitutional:      General: She is not in acute distress.    Appearance: She is well-developed and well-groomed. She is not ill-appearing or toxic-appearing.     Comments: Appears as if she does not feel well sitting on exam table.   HENT:     Head: Normocephalic and atraumatic.     Comments: No facial swelling noted.     Right Ear: Hearing, tympanic membrane, ear canal and external ear normal.     Left Ear: Hearing, tympanic membrane, ear canal and external ear normal.     Nose: Nose normal.     Right Sinus: No maxillary sinus tenderness or frontal sinus tenderness.     Left Sinus: No maxillary sinus tenderness or frontal sinus tenderness.     Mouth/Throat:     Lips: Pink.     Mouth: Mucous membranes are moist.     Dentition: Abnormal dentition. Dental tenderness, gingival swelling and dental caries present.     Tongue: No lesions.     Pharynx: Oropharynx is clear. Uvula midline.   Eyes:     Conjunctiva/sclera: Conjunctivae normal.  Neck:     Musculoskeletal: Normal range of motion.  Pulmonary:     Effort: Pulmonary effort is normal.  Lymphadenopathy:     Head:     Right side of head: No submental, submandibular, tonsillar, preauricular, posterior auricular or occipital adenopathy.     Left side of head: No submental, submandibular, tonsillar, preauricular, posterior auricular or occipital adenopathy.     Cervical: No cervical adenopathy.  Skin:    General: Skin is warm and dry.  Neurological:  Mental Status: She is alert and oriented to person, place, and time.      No results found for this or any previous visit (from the past 24 hour(s)).  Assessment and Plan :  1. Pain, dental 2. Gingival swelling Pt appears uncomfortable but is in NAD. VSS. She is afebrile. Area of concern with decaying tooth  Remnants and surrounding gingival swelling and redness. No dental abscess palpated but will cover for potential underlying infection. Rec oral abx, ibuprofen as prescribed  for pain (may also use OTC tylenol as prescribed), and contacting a dentist to be seen within the next 1-3 days for further evaluation and management. Avoid hard foods. Seek care at ED if sx worsen/develops new concerning sx with tx plan. Pt voices her understanding and agrees to do so.  - amoxicillin-clavulanate (AUGMENTIN) 875-125 MG tablet; Take 1 tablet by mouth 2 (two) times daily for 7 days.  Dispense: 14 tablet; Refill: 0 - ibuprofen (ADVIL) 800 MG tablet; Take 1 tablet (800 mg total) by mouth every 8 (eight) hours as needed.  Dispense: 30 tablet; Refill: 0   Benjiman CoreBrittany , Cordelia Poche-C  [facility] 11/27/2018 1:27 PM

## 2018-11-27 NOTE — Patient Instructions (Signed)
Dental Abscess  Start antibiotics and take as prescribed. You may use ibuprofen 800mg  every 8 hours. You can take tylenol 650 mg every 6 hours. Avoid hard foods. Contact dentist to see if appointment can be moved up. If not, contact [facility] and they can get you in on Monday at 11. If symptoms worsen with treatment plan, go to ED.   [facility] [phone] [place], [place] [address]  Mon-Thurs: 8:00am - 6:00pm Friday: 8:00am - 4:00pm Saturday: 9:00am - 3:00pm By Appointment   A dental abscess is an area of pus in or around a tooth. It comes from an infection. It can cause pain and other symptoms. Treatment will help with symptoms and prevent the infection from spreading. Follow these instructions at home: Medicines  Take over-the-counter and prescription medicines only as told by your dentist.  If you were prescribed an antibiotic medicine, take it as told by your dentist. Do not stop taking it even if you start to feel better.  If you were prescribed a gel that has numbing medicine in it, use it exactly as told.  Do not drive or use heavy machinery (like a Conservation officer, nature) while taking prescription pain medicine. General instructions  Rinse out your mouth often with salt water. ? To make salt water, dissolve -1 tsp of salt in 1 cup of warm water.  Eat a soft diet while your mouth is healing.  Drink enough fluid to keep your urine pale yellow.  Do not apply heat to the outside of your mouth.  Do not use any products that contain nicotine or tobacco. These include cigarettes and e-cigarettes. If you need help quitting, ask your doctor.  Keep all follow-up visits as told by your dentist. This is important. Prevent an abscess  Brush your teeth every morning and every night. Use fluoride toothpaste.  Floss your teeth each day.  Get dental cleanings as often as told by your dentist.  Think about getting dental  sealant put on teeth that have deep holes (decay).  Drink water that has fluoride in it. ? Most tap water has fluoride. ? Check the label on bottled water to see if it has fluoride in it.  Drink water instead of sugary drinks.  Eat healthy meals and snacks.  Wear a mouth guard or face shield when you play sports. Contact a doctor if:  Your pain is worse, and medicine does not help. Get help right away if:  You have a fever or chills.  Your symptoms suddenly get worse.  You have a very bad headache.  You have problems breathing or swallowing.  You have trouble opening your mouth.  You have swelling in your neck or close to your eye. Summary  A dental abscess is an area of pus in or around a tooth. It is caused by an infection.  Treatment will help with symptoms and prevent the infection from spreading.  Take over-the-counter and prescription medicines only as told by your dentist.  To prevent an abscess, take good care of your teeth. Brush your teeth every morning and night. Use floss every day.  Get dental cleanings as often as told by your dentist. This information is not intended to replace advice given to you by your health care provider. Make sure you discuss any questions you have with your health care provider. Document Released: 10/05/2014 Document Revised: 01/21/2017 Document Reviewed: 01/21/2017 Elsevier Interactive Patient Education  2019 [employer].

## 2018-12-04 ENCOUNTER — Other Ambulatory Visit (HOSPITAL_COMMUNITY)
Admission: RE | Admit: 2018-12-04 | Discharge: 2018-12-04 | Disposition: A | Discharge status: Home / Self Care | Payer: 59 | Source: Ambulatory Visit | Attending: Family Medicine | Admitting: Family Medicine

## 2018-12-04 DIAGNOSIS — R600 Localized edema: Secondary | ICD-10-CM | POA: Diagnosis not present

## 2018-12-04 LAB — BASIC METABOLIC PANEL
Anion gap: 10 (ref 5–15)
BUN: 17 mg/dL (ref 6–20)
CO2: 22 mmol/L (ref 22–32)
Calcium: 8.9 mg/dL (ref 8.9–10.3)
Chloride: 108 mmol/L (ref 98–111)
Creatinine, Ser: 0.83 mg/dL (ref 0.44–1.00)
GFR calc Af Amer: >60 mL/min (ref >60)
GFR calc non Af Amer: >60 mL/min (ref >60)
Glucose, Bld: 81 mg/dL (ref 70–99)
Potassium: 3.7 mmol/L (ref 3.5–5.1)
Sodium: 140 mmol/L (ref 135–145)

## 2018-12-10 NOTE — Progress Notes (Signed)
Virtual Visit via Video Note The purpose of this virtual visit is to provide medical care while limiting exposure to the novel coronavirus.    Consent was obtained for video visit:  Yes Answered questions that patient had about telehealth interaction:  Yes I discussed the limitations, risks, security and privacy concerns of performing an evaluation and management service by telemedicine. I also discussed with the patient that there may be a patient responsible charge related to this service. The patient expressed understanding and agreed to proceed.  Pt location: Home Physician Location: Home Name of referring provider:  Assunta FoundGolding, John, MD I connected with Karl PockLukisha G Blok at patients initiation/request on 12/11/2018 at  8:30 AM EDT by video enabled telemedicine application and verified that I am speaking with the correct person using two identifiers. Pt MRN:  [id number] Pt DOB:  November 18, 1975 Video Participants:  Karl PockLukisha G Settles   HISTORY OF PRESENT ILLNESS: Shannon Gould is a 43 year old left-handed female with generalized anxiety disorder and major depression who follows up for migraines.  UPDATE: In December, she was tapered off of topiramate and started on Aimovig.  She had a reduction in headache frequency.  She realized she was actually having daily headaches while just one migraine a month.  Still 1 migraine a month but was having 2 to 3 headache days a week instead of daily.  She restarted topiramate 2 weeks ago because headaches to see if she can further reduce headache days.  She would like to start the ER due to less side effects.  Her pharmacy refilled Maxalt instead of Relpax.  Intensity:  Moderate to severe Duration:  2 hours with Relpax.  Does not respond to Maxalt. Frequency:  1 severe migraine a month.  2-3 headache days a week Current NSAIDS:  Ibuprofen 600mg  Current analgesics: none Current triptans: Relpax 40 mg Current ergotamine: None Current anti-emetic:  none  Current muscle relaxants: None Current anti-anxiolytic: Xanax Current sleep aide: Sonata Current Antihypertensive medications: None Current Antidepressant medications: Venlafaxine XL 225 mg daily, Abilify Current Anticonvulsant medications: topiramate 100mg  twice daily Current anti-CGRP: Aimovig 70mg   Current Vitamins/Herbal/Supplements: Vitamin D Current Antihistamines/Decongestants: None Other therapy: None Hormone/birth control: IUD  Caffeine:  no Alcohol:  occasional Smoker:  no Diet:  hydrates Exercise:  no Depression:  yes; Anxiety:  Yes.  She sees a Veterinary surgeoncounselor for therapy. Other pain:  Suboccipital pain Sleep hygiene:  Insomnia.  Uses sonata and weighted blanket.  HISTORY: Onset:  20s Location:  Usually left sided Quality:  pounding Initial Intensity:  6/10.  She denies new headache, thunderclap headache or severe headache that wakes her from sleep. Aura:  no Prodrome:  no Postdrome:  no Associated symptoms:  Nausea, photophobia, phonophobia, blurred vision, left eye droops, osmophobia.  She denies associated vomiting or unilateral numbness or weakness. Initial Duration:  1 day Initial Frequency:  Once a month Initial Frequency of abortive medication: once or twice a month Triggers:  Caffeine Relieving factors:  Laying down Activity:  Aggravates She also reported near daily dull bifrontal headaches which she treats with ibuprofen 600mg .  CT of head from 01/25/2015 was personally reviewed and revealed no acute intracranial abnormalities.  Past NSAIDS: Ibuprofen, naproxen Past analgesics:  Tylenol, Excedrin, Ultracet Past abortive triptans: Maxalt 5 mg, Sumatriptan tablet (caused nausea) Past abortive ergotamine: none Past muscle relaxants:  none Past anti-emetic:  none Past antihypertensive medications:  none Past antidepressant medications: Wellbutrin, Celexa, Zoloft 100 mg Past anticonvulsant medications:  none Past anti-CGRP:  none Past  vitamins/Herbal/Supplements:  none Past antihistamines/decongestants:  none Other past therapies:  none  PAST MEDICAL HISTORY: Past Medical History:  Diagnosis Date  . Anemia    hx of patient reports   . Anxiety   . Asthma   . Depression   . GERD (gastroesophageal reflux disease)   . Head ache     MEDICATIONS: Current Outpatient Medications on File Prior to Visit  Medication Sig Dispense Refill  . albuterol (PROVENTIL HFA) 108 (90 BASE) MCG/ACT inhaler Inhale 2 puffs into the lungs every 6 (six) hours as needed. For shortness of breath     . ALPRAZolam (XANAX) 1 MG tablet TAKE 1/2 TO 1 TABLET BY MOUTH TWICE A DAY AS NEEDED 60 tablet 0  . ARIPiprazole (ABILIFY) 2 MG tablet Take 1 tablet (2 mg total) by mouth every morning. (Patient not taking: Reported on 11/27/2018) 90 tablet 1  . Brexpiprazole (REXULTI) 0.5 MG TABS Take 1 tablet (0.5 mg total) by mouth every morning. 30 tablet 0  . eletriptan (RELPAX) 40 MG tablet Take 40 mg by mouth as needed for migraine or headache. May repeat in 2 hours if headache persists or recurs.    Eduard Roux (AIMOVIG) 70 MG/ML SOAJ Inject 70 mg into the skin every 30 (thirty) days. 1 pen 11  . furosemide (LASIX) 40 MG tablet Take 40 mg by mouth.    Marland Kitchen ibuprofen (ADVIL) 800 MG tablet Take 1 tablet (800 mg total) by mouth every 8 (eight) hours as needed. 30 tablet 0  . montelukast (SINGULAIR) 10 MG tablet Take 10 mg by mouth daily.  11  . oxybutynin (DITROPAN-XL) 10 MG 24 hr tablet     . rizatriptan (MAXALT-MLT) 10 MG disintegrating tablet     . topiramate (TOPAMAX) 100 MG tablet Take 100 mg by mouth 2 (two) times daily.    Marland Kitchen trimethoprim-polymyxin b (POLYTRIM) ophthalmic solution Place 2 drops into both eyes every 4 (four) hours. (Patient not taking: Reported on 11/27/2018) 10 mL 0  . Venlafaxine HCl 225 MG TB24 Take 1 tablet (225 mg total) by mouth daily. 90 each 1  . zolpidem (AMBIEN) 10 MG tablet Take 1 tablet (10 mg total) by mouth at bedtime as  needed for up to 30 days for sleep. 30 tablet 0   No current facility-administered medications on file prior to visit.     ALLERGIES: Allergies  Allergen Reactions  . Hydrocodone Itching  . Wellbutrin [Bupropion]   . Caffeine Other (See Comments)    Headache   . Lactose Intolerance (Gi) Other (See Comments)    gas    FAMILY HISTORY: Family History  Problem Relation Age of Onset  . Hypertension Mother   . Diabetes Mother   . Heart disease Father   . Sleep apnea Father   . Diabetes Father   . Heart attack Father        x2  . Heart disease Sister   . Anemia Sister   . Hypertension Sister   . Heart disease Brother   . Hypertension Brother   . Seizures Paternal Grandmother   . Anxiety disorder Daughter   . Depression Daughter   . Asthma Daughter   . Asthma Son   . Asthma Son    SOCIAL HISTORY: Social History   Socioeconomic History  . Marital status: Single    Spouse name: Not on file  . Number of children: 3  . Years of education: Not on file  . Highest education level: Associate degree: occupational, technical,  or vocational program  Occupational History  . Occupation: LPN    Employer: [employer]  Social Needs  . Financial resource strain: Not on file  . Food insecurity    Worry: Not on file    Inability: Not on file  . Transportation needs    Medical: Not on file    Non-medical: Not on file  Tobacco Use  . Smoking status: Never Smoker  . Smokeless tobacco: Never Used  Substance and Sexual Activity  . Alcohol use: Yes    Comment: occ  . Drug use: No  . Sexual activity: Yes    Birth control/protection: I.U.D.  Lifestyle  . Physical activity    Days per week: Not on file    Minutes per session: Not on file  . Stress: Not on file  Relationships  . Social Musicianconnections    Talks on phone: Not on file    Gets together: Not on file    Attends religious service: Not on file    Active member of club or organization: Not on file    Attends  meetings of clubs or organizations: Not on file    Relationship status: Not on file  . Intimate partner violence    Fear of current or ex partner: Not on file    Emotionally abused: Not on file    Physically abused: Not on file    Forced sexual activity: Not on file  Other Topics Concern  . Not on file  Social History Narrative   Patient is left-handed. She lives in a one level home with 2 of her children, one is at college. She avoids caffeine. She does not exercise.    Observations/Objective:   Blood pressure 132/78, temperature 97.8 F (36.6 C), temperature source Temporal, height 5\' 3"  (1.6 m), weight 217 lb (98.4 kg). No acute distress.  Alert and oriented.  Speech fluent and not dysarthric.  Language intact.  Eyes orthophoric on primary gaze.  Face symmetric.  Assessment and Plan:   Migraine without aura, without status migrainosus not intractable.  I suggested increasing Aimovig instead but she would like to remain on topiramate.  1.  For preventative management, continue Aimovig 70mg  and switch topiramate to ER 200mg  at bedtime 2.  For abortive therapy, will try to get her back on Relpax as it was effective. 3.  Limit use of pain relievers to no more than 2 days out of week to prevent risk of rebound or medication-overuse headache. 4.  Keep headache diary 5.  Exercise, hydration, caffeine cessation, sleep hygiene, monitor for and avoid triggers 6.  Consider:  magnesium citrate 400mg  daily, riboflavin 400mg  daily, and coenzyme Q10 100mg  three times daily 7. Always keep in mind that currently taking a hormone or birth control may be a possible trigger or aggravating factor for migraine. 8. Follow up 4 months   Follow Up Instructions:    -I discussed the assessment and treatment plan with the patient. The patient was provided an opportunity to ask questions and all were answered. The patient agreed with the plan and demonstrated an understanding of the instructions.   The  patient was advised to call back or seek an in-person evaluation if the symptoms worsen or if the condition fails to improve as anticipated.   CC: Assunta FoundJohn Golding, MD

## 2018-12-11 ENCOUNTER — Other Ambulatory Visit: Payer: Self-pay

## 2018-12-11 ENCOUNTER — Encounter: Payer: Self-pay | Admitting: Neurology

## 2018-12-11 ENCOUNTER — Telehealth (INDEPENDENT_AMBULATORY_CARE_PROVIDER_SITE_OTHER): Payer: 59 | Admitting: Neurology

## 2018-12-11 VITALS — BP 132/78 | Temp 97.8°F | Ht 63.0 in | Wt 217.0 lb

## 2018-12-11 DIAGNOSIS — G43009 Migraine without aura, not intractable, without status migrainosus: Secondary | ICD-10-CM | POA: Diagnosis not present

## 2018-12-11 MED ORDER — TROKENDI XR 200 MG PO CP24: 1.0000 | ORAL_CAPSULE | Freq: Every day | ORAL | 3 refills | Status: DC

## 2018-12-11 MED ORDER — ELETRIPTAN HYDROBROMIDE 40 MG PO TABS: ORAL_TABLET | ORAL | 0 refills | Status: DC

## 2018-12-11 MED ORDER — ELETRIPTAN HYDROBROMIDE 40 MG PO TABS: 40.0000 mg | ORAL_TABLET | ORAL | 3 refills | Status: DC | PRN

## 2018-12-11 MED FILL — ELETRIPTAN HYDROBROMIDE 40: 40 | 30 days supply | Qty: 10 | Fill #0

## 2018-12-11 MED FILL — TROKENDI XR 200 MG CAPSULE: 200 | 30 days supply | Qty: 30 | Fill #0

## 2018-12-11 NOTE — Progress Notes (Addendum)
Pt has had excellent results with Relpax, but insurance has only approved Maxalt.  Initiating PA on cover my meds Key: [id number] - PA Case ID: [id number]

## 2018-12-17 MED FILL — AIMOVIG 70 MG/ML SOAJ: 70 | 30 days supply | Qty: 1 | Fill #0

## 2018-12-17 NOTE — Progress Notes (Signed)
Rcvd fax from [place]. Relpax approved. PA reference# [id number] Approved for 12 fills from 12/11/18 - 12/10/19

## 2018-12-26 ENCOUNTER — Ambulatory Visit: Payer: 59 | Admitting: Physician Assistant

## 2019-01-13 MED FILL — TROKENDI XR 200 MG CAPSULE: 200 | 30 days supply | Qty: 30 | Fill #1

## 2019-01-16 ENCOUNTER — Telehealth: Payer: Self-pay | Admitting: Physician Assistant

## 2019-01-16 ENCOUNTER — Other Ambulatory Visit: Payer: Self-pay

## 2019-01-16 MED ORDER — VENLAFAXINE HCL ER 225 MG PO TB24: 225.0000 mg | ORAL_TABLET | Freq: Every day | ORAL | 0 refills | Status: DC

## 2019-01-16 NOTE — Telephone Encounter (Signed)
Refill sent.

## 2019-01-16 NOTE — Telephone Encounter (Signed)
Shannon Gould call to request a refill on her effexor.  appt 02/12/19.  Send to [facility] out pt pharmacy

## 2019-01-19 MED FILL — VENLAFAXINE HCL ER 225 MG T: 225 | 90 days supply | Qty: 90 | Fill #0

## 2019-01-22 MED FILL — AIMOVIG 70 MG/ML SOAJ: 70 | 30 days supply | Qty: 1 | Fill #1

## 2019-02-12 ENCOUNTER — Ambulatory Visit: Payer: 59 | Admitting: Physician Assistant

## 2019-02-20 MED FILL — TROKENDI XR 200 MG CAPSULE: 200 | 30 days supply | Qty: 30 | Fill #2

## 2019-02-20 MED FILL — AIMOVIG 70 MG/ML SOAJ: 70 | 30 days supply | Qty: 1 | Fill #2

## 2019-02-27 ENCOUNTER — Other Ambulatory Visit: Payer: Self-pay | Admitting: Physician Assistant

## 2019-02-27 NOTE — Telephone Encounter (Signed)
Says another provider discontinued. I will call Monday to check if still taking.

## 2019-03-02 NOTE — Telephone Encounter (Signed)
Shannon Gould, I changed her to [place] back in May, and she was supposed to d/c Abilify IF her insurance approved Rexulti.  But it doesn't look like she's on Rexulti.  Just FYI so you know the plan when you talk to her. Thanks for checking with her.

## 2019-03-02 NOTE — Telephone Encounter (Signed)
Left message to call back  

## 2019-03-03 NOTE — Telephone Encounter (Signed)
Patient called back she took rexulti but ran out then went back on abilify. Asking to refill abilify until office visit 03/12/2019. Rexulti too expensive she said. 30 day submitted. Abilify was causing her to gain weight.

## 2019-03-04 NOTE — Telephone Encounter (Signed)
ok 

## 2019-03-12 ENCOUNTER — Encounter: Payer: Self-pay | Admitting: Physician Assistant

## 2019-03-12 ENCOUNTER — Other Ambulatory Visit: Payer: Self-pay

## 2019-03-12 ENCOUNTER — Ambulatory Visit (INDEPENDENT_AMBULATORY_CARE_PROVIDER_SITE_OTHER): Payer: 59 | Admitting: Physician Assistant

## 2019-03-12 DIAGNOSIS — F411 Generalized anxiety disorder: Secondary | ICD-10-CM

## 2019-03-12 DIAGNOSIS — F3160 Bipolar disorder, current episode mixed, unspecified: Secondary | ICD-10-CM | POA: Diagnosis not present

## 2019-03-12 DIAGNOSIS — G4700 Insomnia, unspecified: Secondary | ICD-10-CM

## 2019-03-12 MED ORDER — ARIPIPRAZOLE 15 MG PO TABS: 15.0000 mg | ORAL_TABLET | Freq: Every day | ORAL | 1 refills | Status: DC

## 2019-03-12 MED ORDER — ARIPIPRAZOLE 10 MG PO TABS: ORAL_TABLET | ORAL | 1 refills | Status: DC

## 2019-03-12 MED ORDER — VENLAFAXINE HCL ER 150 MG PO CP24: 150.0000 mg | ORAL_CAPSULE | Freq: Every day | ORAL | 0 refills | Status: DC

## 2019-03-12 MED ORDER — ZOLPIDEM TARTRATE 10 MG PO TABS: 10.0000 mg | ORAL_TABLET | Freq: Every evening | ORAL | 1 refills | Status: DC | PRN

## 2019-03-12 MED FILL — VENLAFAXINE HCL ER 150 MG C: 150 | 30 days supply | Qty: 30 | Fill #0

## 2019-03-12 MED FILL — ZOLPIDEM TARTRATE 10 MG TAB: 10 | 30 days supply | Qty: 30 | Fill #0

## 2019-03-12 MED FILL — ARIPiprazole 15 MG TABS: 15 | 30 days supply | Qty: 30 | Fill #0

## 2019-03-12 NOTE — Progress Notes (Signed)
[facility]  Patient ID: Shannon Gould,  MRN: [id number]  PCP: Assunta Found, MD  Date of Evaluation: 03/12/2019 Time spent:25 minutes  Chief Complaint:  Chief Complaint    Follow-up     Virtual Visit via Telephone Note  I connected with patient by a video enabled telemedicine application or telephone, with their informed consent, and verified patient privacy and that I am speaking with the correct person using two identifiers.  I am private, in my office and the patient is home.   I discussed the limitations, risks, security and privacy concerns of performing an evaluation and management service by telephone and the availability of in person appointments. I also discussed with the patient that there may be a patient responsible charge related to this service. The patient expressed understanding and agreed to proceed.   I discussed the assessment and treatment plan with the patient. The patient was provided an opportunity to ask questions and all were answered. The patient agreed with the plan and demonstrated an understanding of the instructions.   The patient was advised to call back or seek an in-person evaluation if the symptoms worsen or if the condition fails to improve as anticipated.  I provided 25 minutes of non-face-to-face time during this encounter.  HISTORY/CURRENT STATUS: HPI not doing well.  Patient reports that for the past couple of weeks especially, she has not been doing well.  She has had a lot more energy.  And over this past weekend, made impulsive decisions and risky behaviors sexually.  She likes to shop and has been spending money but not any different than normal.  Positive grandiosity.  She cannot sleep more than a couple of hours most nights, even with the Ambien.  Even in the same day however, she feels depressed at times.  After she has a few days of the above symptoms, she may feel extremely tired and fight sleep to do the things she wants  and needs to do.  She does not cry easily.  Her motivation can drop in a heartbeat.  She denies suicidal or homicidal thoughts.  When she feels depressed, she does not want to go anywhere or do anything.  Denies hallucinations.  Anxiety has been an issue as well.  The Xanax helps.  She reports having been out of the Effexor for a week or so before she got it filled.  This happened 2 or 3 weeks back, then when she restarted it, she started having the above symptoms of impulsive behaviors and increased energy.  She has never had anything like this until the past few months but this last week or so has been the worst it has ever been.  A few months ago, we tried to switch from Abilify to Rexulti, as an adjunct for major depressive disorder.  She had been gaining weight on the Abilify.  She took the Rexulti for about a month and noticed that her mood was still good, about the same as it had been on the Abilify.  She did not gain any weight nor lose any weight.  She went back on the Abilify due to cost.  Denies dizziness, syncope, seizures, numbness, tingling, tremor, tics, unsteady gait, slurred speech, confusion. Denies muscle or joint pain, stiffness, or dystonia.  Individual Medical History/ Review of Systems: Changes? :No    Past medications for mental health diagnoses include: Effexor, Vistaril, Xanax, Ambien, Zoloft, Wellbutrin which caused increased agitation and suicidal ideations, trazodone, Abilify, Sonata  Allergies: Hydrocodone, Wellbutrin [  bupropion], Caffeine, and Lactose intolerance (gi)  Current Medications:  Current Outpatient Medications:  .  albuterol (PROVENTIL HFA) 108 (90 BASE) MCG/ACT inhaler, Inhale 2 puffs into the lungs every 6 (six) hours as needed. For shortness of breath , Disp: , Rfl:  .  ALPRAZolam (XANAX) 1 MG tablet, TAKE 1/2 TO 1 TABLET BY MOUTH TWICE A DAY AS NEEDED, Disp: 60 tablet, Rfl: 0 .  eletriptan (RELPAX) 40 MG tablet, Take 1 tablet for migraine.  May repeat  in 2 hours if headache persists or recurs.  Max 2 tablets in 24hrs, Disp: 10 tablet, Rfl: 0 .  eletriptan (RELPAX) 40 MG tablet, Take 1 tablet (40 mg total) by mouth as needed for migraine or headache. May repeat in 2 hours if headache persists or recurs., Disp: 10 tablet, Rfl: 3 .  Erenumab-aooe (AIMOVIG) 70 MG/ML SOAJ, Inject 70 mg into the skin every 30 (thirty) days., Disp: 1 pen, Rfl: 11 .  furosemide (LASIX) 40 MG tablet, Take 40 mg by mouth., Disp: , Rfl:  .  ibuprofen (ADVIL) 800 MG tablet, Take 1 tablet (800 mg total) by mouth every 8 (eight) hours as needed., Disp: 30 tablet, Rfl: 0 .  Topiramate ER (TROKENDI XR) 200 MG CP24, Take 1 capsule by mouth at bedtime., Disp: 30 capsule, Rfl: 3 .  zolpidem (AMBIEN) 10 MG tablet, Take 1 tablet (10 mg total) by mouth at bedtime as needed for sleep., Disp: 30 tablet, Rfl: 1 .  ARIPiprazole (ABILIFY) 15 MG tablet, Take 1 tablet (15 mg total) by mouth daily., Disp: 30 tablet, Rfl: 1 .  Brexpiprazole (REXULTI) 0.5 MG TABS, Take 1 tablet (0.5 mg total) by mouth every morning. (Patient not taking: Reported on 03/12/2019), Disp: 30 tablet, Rfl: 0 .  montelukast (SINGULAIR) 10 MG tablet, Take 10 mg by mouth daily., Disp: , Rfl: 11 .  oxybutynin (DITROPAN-XL) 10 MG 24 hr tablet, , Disp: , Rfl:  .  venlafaxine XR (EFFEXOR XR) 150 MG 24 hr capsule, Take 1 capsule (150 mg total) by mouth daily with breakfast., Disp: 30 capsule, Rfl: 0 Medication Side Effects: none  Family Medical/ Social History: Changes? No  MENTAL HEALTH EXAM:  There were no vitals taken for this visit.There is no height or weight on file to calculate BMI.  General Appearance: unable to assess  Eye Contact:  unable to assess  Speech:  Clear and Coherent  Volume:  Normal  Mood:  Euthymic  Affect:  unable to assess  Thought Process:  Goal Directed and Descriptions of Associations: Intact  Orientation:  Full (Time, Place, and Person)  Thought Content: Logical   Suicidal Thoughts:  No   Homicidal Thoughts:  No  Memory:  WNL  Judgement:  Good  Insight:  Good  Psychomotor Activity:  unable to assess  Concentration:  Concentration: Good  Recall:  Good  Fund of Knowledge: Good  Language: Good  Assets:  Desire for Improvement  ADL's:  Intact  Cognition: WNL  Prognosis:  Good    DIAGNOSES:    ICD-10-CM   1. Bipolar I disorder, most recent episode mixed ([facility])  F31.60   2. Insomnia, unspecified type  G47.00   3. GAD (generalized anxiety disorder)  F41.1     Receiving Psychotherapy: No    RECOMMENDATIONS:  We had a long discussion about her diagnosis.  She now is showing signs and symptoms of bipolar disorder, mixed.  I explained in detail the diagnosis, the biochemistry behind it, the fact that being off the  Effexor for a week or so and then going back on it could have caused a manic episode to occur.  We also discussed the fact that bipolar depression can also appear to be major depressive disorder and that is likely what she has had all along.  All questions were answered to her satisfaction. Options to treat bipolar disorder include increasing the Abilify or changing to a different atypical antipsychotic or adding a mood stabilizer or lithium.  I would recommend increasing the Abilify since she is already on it and we know what the side effects are.  She agrees. Increase Abilify to 15 mg p.o. every morning.  She has some of the 2 mg and can increase that to 5 pills daily for 2 to 3 days and then go up to the 15 mg daily pill. Decrease Effexor XR to 150 mg daily.  We may need to decrease even further, even quicker but that will depend on how she responds to the Abilify 2 help with the mania. Continue Xanax 1 mg, 1/2-1 twice daily as needed. Continue Ambien 10 mg nightly as needed. Recommend therapy. Return in 4 weeks.  Melony Overlyeresa Moni Rothrock, PA-C

## 2019-03-23 MED FILL — AIMOVIG 70 MG/ML SOAJ: 70 | 30 days supply | Qty: 1 | Fill #3

## 2019-04-01 MED FILL — TROKENDI XR 200 MG CAPSULE: 200 | 30 days supply | Qty: 30 | Fill #3

## 2019-04-10 ENCOUNTER — Ambulatory Visit (INDEPENDENT_AMBULATORY_CARE_PROVIDER_SITE_OTHER): Payer: 59 | Admitting: Physician Assistant

## 2019-04-10 ENCOUNTER — Encounter: Payer: Self-pay | Admitting: Physician Assistant

## 2019-04-10 ENCOUNTER — Other Ambulatory Visit: Payer: Self-pay

## 2019-04-10 DIAGNOSIS — G4700 Insomnia, unspecified: Secondary | ICD-10-CM

## 2019-04-10 DIAGNOSIS — F331 Major depressive disorder, recurrent, moderate: Secondary | ICD-10-CM

## 2019-04-10 DIAGNOSIS — R0609 Other forms of dyspnea: Secondary | ICD-10-CM

## 2019-04-10 DIAGNOSIS — R0600 Dyspnea, unspecified: Secondary | ICD-10-CM | POA: Diagnosis not present

## 2019-04-10 DIAGNOSIS — F411 Generalized anxiety disorder: Secondary | ICD-10-CM | POA: Diagnosis not present

## 2019-04-10 DIAGNOSIS — R202 Paresthesia of skin: Secondary | ICD-10-CM | POA: Diagnosis not present

## 2019-04-10 MED ORDER — ARIPIPRAZOLE 20 MG PO TABS: 20.0000 mg | ORAL_TABLET | Freq: Every day | ORAL | 1 refills | Status: DC

## 2019-04-10 MED ORDER — VENLAFAXINE HCL ER 150 MG PO CP24: 150.0000 mg | ORAL_CAPSULE | Freq: Every day | ORAL | 1 refills | Status: DC

## 2019-04-10 MED ORDER — ALPRAZOLAM 1 MG PO TABS: ORAL_TABLET | ORAL | 1 refills | Status: DC

## 2019-04-10 MED FILL — ARIPiprazole 20 MG TABS: 20 | 30 days supply | Qty: 30 | Fill #0

## 2019-04-10 MED FILL — VENLAFAXINE HCL ER 150 MG C: 150 | 30 days supply | Qty: 30 | Fill #0

## 2019-04-10 MED FILL — ALPRAZolam 1 MG TABS: 1 | 30 days supply | Qty: 60 | Fill #0

## 2019-04-10 NOTE — Progress Notes (Signed)
[facility]  Patient ID: SRAH AKE,  MRN: [id number]  PCP: Assunta Found, MD  Date of Evaluation: 04/10/2019 Time spent:25 minutes  Chief Complaint:  Chief Complaint    Follow-up      HISTORY/CURRENT STATUS: HPI For 1 month med check.  At the last visit, I increased Abilify and decrease the Effexor.  She has been quite a bit better with decreased impulsivity and risky behaviors although it is not completely resolved.  She is sleeping much better though.  She is getting at least 7 to 8 hours a night.  Prior to that, she was only sleeping a couple of hours at night and was still energetic.  Denies grandiosity.  No hallucinations.  She is able to enjoy things.  Energy and motivation are good.  No easy crying.  Denies suicidal or homicidal thoughts.  Anxiety is well controlled.  She does need the Xanax at times but not too often.  Work is going well.  Does report some increased shortness of breath with exertion for the past few weeks.  She does have a cardiologist and is on Lasix already so we will discuss this with them.  She is already checked her oxygen saturations which are normal.  Pulse has also been normal when she is short of breath.    Reporst tingling beginning over the past couple of weeks, only in her fingers at random times.  She is not sure if it is related to decreasing the Effexor or increasing the Abilify.    Individual Medical History/ Review of Systems: Changes? :No    Review of Systems  Constitutional: Negative.   HENT: Negative.   Eyes: Negative.   Respiratory: Positive for shortness of breath.   Cardiovascular: Positive for leg swelling.  Gastrointestinal: Negative.   Genitourinary: Negative.   Musculoskeletal: Negative.   Skin: Negative.   Neurological: Positive for tingling.  Endo/Heme/Allergies: Negative.   Psychiatric/Behavioral: Negative.    Past medications for mental health diagnoses include: Effexor, Vistaril, Xanax, Ambien,  Zoloft, Wellbutrin which caused increased agitation and suicidal ideations, trazodone, Abilify, Sonata  Allergies: Hydrocodone, Wellbutrin [bupropion], Caffeine, and Lactose intolerance (gi)  Current Medications:  Current Outpatient Medications:  .  albuterol (PROVENTIL HFA) 108 (90 BASE) MCG/ACT inhaler, Inhale 2 puffs into the lungs every 6 (six) hours as needed. For shortness of breath , Disp: , Rfl:  .  ALPRAZolam (XANAX) 1 MG tablet, TAKE 1/2 TO 1 TABLET BY MOUTH TWICE A DAY AS NEEDED, Disp: 60 tablet, Rfl: 1 .  eletriptan (RELPAX) 40 MG tablet, Take 1 tablet for migraine.  May repeat in 2 hours if headache persists or recurs.  Max 2 tablets in 24hrs, Disp: 10 tablet, Rfl: 0 .  eletriptan (RELPAX) 40 MG tablet, Take 1 tablet (40 mg total) by mouth as needed for migraine or headache. May repeat in 2 hours if headache persists or recurs., Disp: 10 tablet, Rfl: 3 .  Erenumab-aooe (AIMOVIG) 70 MG/ML SOAJ, Inject 70 mg into the skin every 30 (thirty) days., Disp: 1 pen, Rfl: 11 .  furosemide (LASIX) 40 MG tablet, Take 40 mg by mouth., Disp: , Rfl:  .  ibuprofen (ADVIL) 800 MG tablet, Take 1 tablet (800 mg total) by mouth every 8 (eight) hours as needed., Disp: 30 tablet, Rfl: 0 .  montelukast (SINGULAIR) 10 MG tablet, Take 10 mg by mouth daily., Disp: , Rfl: 11 .  Topiramate ER (TROKENDI XR) 200 MG CP24, Take 1 capsule by mouth at bedtime., Disp: 30  capsule, Rfl: 3 .  venlafaxine XR (EFFEXOR XR) 150 MG 24 hr capsule, Take 1 capsule (150 mg total) by mouth daily with breakfast., Disp: 30 capsule, Rfl: 1 .  zolpidem (AMBIEN) 10 MG tablet, Take 1 tablet (10 mg total) by mouth at bedtime as needed for sleep., Disp: 30 tablet, Rfl: 1 .  ARIPiprazole (ABILIFY) 20 MG tablet, Take 1 tablet (20 mg total) by mouth daily., Disp: 30 tablet, Rfl: 1 .  oxybutynin (DITROPAN-XL) 10 MG 24 hr tablet, , Disp: , Rfl:  Medication Side Effects: none specifically no weight gain.  Family Medical/ Social History:  Changes? No  MENTAL HEALTH EXAM:  There were no vitals taken for this visit.There is no height or weight on file to calculate BMI.  General Appearance: Casual, Neat, Well Groomed and Obese  Eye Contact:  Good  Speech:  Clear and Coherent  Volume:  Normal  Mood:  Euthymic  Affect:  Appropriate  Thought Process:  Goal Directed and Descriptions of Associations: Intact  Orientation:  Full (Time, Place, and Person)  Thought Content: Logical   Suicidal Thoughts:  No  Homicidal Thoughts:  No  Memory:  WNL  Judgement:  Good  Insight:  Good  Psychomotor Activity:  Normal  Concentration:  Concentration: Good  Recall:  Good  Fund of Knowledge: Good  Language: Good  Assets:  Desire for Improvement  ADL's:  Intact  Cognition: WNL  Prognosis:  Good    DIAGNOSES:    ICD-10-CM   1. GAD (generalized anxiety disorder)  F41.1   2. Insomnia, unspecified type  G47.00   3. Major depressive disorder, recurrent episode, moderate ([facility])  F33.1   4. Tingling  R20.2   5. DOE (dyspnea on exertion)  R06.00     Receiving Psychotherapy: Yes  Rinaldo Cloud, LCSW   RECOMMENDATIONS:  I spent 25 minutes with her and 50% of that face-to-face time was in counseling concerning her diagnosis, side effects, and treatment options. Since she is still having problems with risky behaviors and impulsivity, I recommend that we increase the Abilify.  I do not think it is necessary to wean her completely off the Effexor because it has been effective to this point and the manic symptoms have certainly decreased after decreasing the dose. Increase Abilify to 20 mg p.o. every morning. Continue Effexor XR 150 mg every morning. Continue Ambien 10 mg nightly as needed. Continue Xanax 1 mg, 1/2-1 p.o. twice daily as needed. We will observe the tingling in her fingers. She will contact her cardiologist concerning the shortness of breath with exertion or go to the ER if it worsens, or is accompanied by chest pain or other  symptoms. Continue therapy with Rinaldo Cloud. Return in 4 weeks.  Donnal Moat, PA-C

## 2019-04-13 ENCOUNTER — Ambulatory Visit (INDEPENDENT_AMBULATORY_CARE_PROVIDER_SITE_OTHER): Payer: 59 | Admitting: Psychiatry

## 2019-04-13 ENCOUNTER — Other Ambulatory Visit: Payer: Self-pay

## 2019-04-13 DIAGNOSIS — F411 Generalized anxiety disorder: Secondary | ICD-10-CM

## 2019-04-13 NOTE — Progress Notes (Signed)
      [facility] Counselor/Therapist Progress Note  Patient ID: Shannon Gould, MRN: [id number],    Date: 04/13/2019  Time Spent:  60 minutes   12:00noon to 1:00pm  Treatment Type: Individual Therapy  Reported Symptoms:  Anxiety, some depression  Mental Status Exam:  Appearance:   Neat     Behavior:  Appropriate and Sharing  Motor:  Normal  Speech/Language:   Normal Rate  Affect:  anxious, some depresion  Mood:  anxious and depressed  Thought process:  normal  Thought content:    WNL  Sensory/Perceptual disturbances:    WNL  Orientation:  oriented to person, place, time/date, situation, day of week, month of year and year  Attention:  Fair  Concentration:  Fair  Memory:  Pt reports some mild short term memory issue 1-2 times weekly.  Fund of knowledge:   Good  Insight:    Good  Judgment:   Good  Impulse Control:  Fair   Risk Assessment: Danger to Self:  No Self-injurious Behavior: No Danger to Others: No Duty to Warn:no Physical Aggression / Violence:No  Access to Firearms a concern: No  Gang Involvement:No   Subjective:  Patient in today.  Adjustment "emotionally" to updated diagnosisj  Interventions: Cognitive Behavioral Therapy and Solution-Oriented/Positive Psychology  Diagnosis:   ICD-10-CM   1. GAD (generalized anxiety disorder)  F41.1     Plan:  Patient not signing tx plan on computer screen due to COVID-19.  Treatment Goals:   Treatment goals remain on tx plan as patient works on strategies to meet her goals.  Progress is noted each session in the "Progress" section on tx Plan.  Long term goal: Reduce thoughts that trigger impulsive behavior and increase self-talk that helps control behavior.   Short term goal: Use cognitive methods to control trigger thoughts and reduce impulsive reactions to those trigger thoughts.  Strategy: Help the patient uncover dysfunctional thoughts that lead to impulsivity. Replace each thought with a thought that is  accurate, positive, self-enhancing, and adaptive.   Progress: Patient has not been seen in therapy since March 2020.  We updated goals since some of her needs and circumstances have changed.  She is motivate and talked very openly about her recent and current struggles with anxiety and impulsivity. She is to be monitoring her thoughts til next appt,, especially noting her anxious thoughts and thoughts that can be triggers for impulsivity.  Will pick up on this next session.   Next appt is to be within 2-3 weeks.   Shannon Ace, LCSW

## 2019-04-16 ENCOUNTER — Telehealth: Payer: 59 | Admitting: Neurology

## 2019-04-23 MED FILL — AIMOVIG 70 MG/ML SOAJ: 70 | 30 days supply | Qty: 1 | Fill #4

## 2019-05-15 ENCOUNTER — Encounter: Payer: Self-pay | Admitting: Physician Assistant

## 2019-05-15 ENCOUNTER — Ambulatory Visit (INDEPENDENT_AMBULATORY_CARE_PROVIDER_SITE_OTHER): Payer: 59 | Admitting: Physician Assistant

## 2019-05-15 ENCOUNTER — Other Ambulatory Visit: Payer: Self-pay | Admitting: Neurology

## 2019-05-15 ENCOUNTER — Other Ambulatory Visit: Payer: Self-pay

## 2019-05-15 DIAGNOSIS — F3160 Bipolar disorder, current episode mixed, unspecified: Secondary | ICD-10-CM

## 2019-05-15 DIAGNOSIS — G4700 Insomnia, unspecified: Secondary | ICD-10-CM

## 2019-05-15 DIAGNOSIS — F411 Generalized anxiety disorder: Secondary | ICD-10-CM

## 2019-05-15 MED ORDER — ALPRAZOLAM 1 MG PO TABS: ORAL_TABLET | ORAL | 2 refills | Status: DC

## 2019-05-15 MED ORDER — VENLAFAXINE HCL ER 150 MG PO CP24: 150.0000 mg | ORAL_CAPSULE | Freq: Every day | ORAL | 0 refills | Status: DC

## 2019-05-15 MED ORDER — ARIPIPRAZOLE 20 MG PO TABS: 20.0000 mg | ORAL_TABLET | Freq: Every day | ORAL | 0 refills | Status: DC

## 2019-05-15 MED ORDER — ZOLPIDEM TARTRATE 10 MG PO TABS: 10.0000 mg | ORAL_TABLET | Freq: Every evening | ORAL | 2 refills | Status: DC | PRN

## 2019-05-15 MED FILL — ALPRAZolam 1 MG TABS: 1 | 30 days supply | Qty: 60 | Fill #1

## 2019-05-15 MED FILL — ARIPiprazole 20 MG TABS: 20 | 30 days supply | Qty: 30 | Fill #1

## 2019-05-15 MED FILL — TROKENDI XR 200 MG CAPSULE: 200 | 90 days supply | Qty: 90 | Fill #0

## 2019-05-15 MED FILL — ZOLPIDEM TARTRATE 10 MG TAB: 10 | 30 days supply | Qty: 30 | Fill #1

## 2019-05-15 MED FILL — VENLAFAXINE HCL ER 150 MG C: 150 | 30 days supply | Qty: 30 | Fill #1

## 2019-05-15 NOTE — Progress Notes (Signed)
[facility]  Patient ID: Shannon Gould,  MRN: [id number]  PCP: Assunta Found, MD  Date of Evaluation: 05/15/2019 Time spent:15 minutes  Chief Complaint:  Chief Complaint    Follow-up      HISTORY/CURRENT STATUS: HPI For routine med check.  She is doing a lot better since we increased the Abilify dose at the last visit.  The only issue is that she still does not sleep well sometimes, even with the Ambien.  She has had no behaviors of mania otherwise though.  No increased energy.  No impulsivity or risky behavior, no increased spending except for the Christmas season.  No abnormal sexual behaviors.  No hallucinations.  Patient denies loss of interest in usual activities and is able to enjoy things.  Denies decreased energy or motivation.  Appetite has not changed.  No extreme sadness, tearfulness, or feelings of hopelessness.  Denies any changes in concentration, making decisions or remembering things.  Denies suicidal or homicidal thoughts.  She has been a little bit more anxious lately, needing the Xanax a couple of days a week.  She does not take it more than once a day on those days as a rule.  She is not sure if there is a trigger or not.  The Xanax is effective.  Work is going well.  Denies dizziness, syncope, seizures, numbness, tingling, tremor, tics, unsteady gait, slurred speech, confusion. Denies muscle or joint pain, stiffness, or dystonia.  Individual Medical History/ Review of Systems: Changes? :No    Past medications for mental health diagnoses include: Effexor, Vistaril, Xanax, Ambien, Zoloft, Wellbutrin which caused increased agitation and suicidal ideations, trazodone, Abilify, Sonata  Allergies: Hydrocodone, Wellbutrin [bupropion], Caffeine, and Lactose intolerance (gi)  Current Medications:  Current Outpatient Medications:  .  albuterol (PROVENTIL HFA) 108 (90 BASE) MCG/ACT inhaler, Inhale 2 puffs into the lungs every 6 (six) hours as needed. For  shortness of breath , Disp: , Rfl:  .  ALPRAZolam (XANAX) 1 MG tablet, TAKE 1/2 TO 1 TABLET BY MOUTH TWICE A DAY AS NEEDED, Disp: 60 tablet, Rfl: 2 .  ARIPiprazole (ABILIFY) 20 MG tablet, Take 1 tablet (20 mg total) by mouth daily., Disp: 90 tablet, Rfl: 0 .  eletriptan (RELPAX) 40 MG tablet, Take 1 tablet for migraine.  May repeat in 2 hours if headache persists or recurs.  Max 2 tablets in 24hrs, Disp: 10 tablet, Rfl: 0 .  eletriptan (RELPAX) 40 MG tablet, Take 1 tablet (40 mg total) by mouth as needed for migraine or headache. May repeat in 2 hours if headache persists or recurs., Disp: 10 tablet, Rfl: 3 .  Erenumab-aooe (AIMOVIG) 70 MG/ML SOAJ, Inject 70 mg into the skin every 30 (thirty) days., Disp: 1 pen, Rfl: 11 .  furosemide (LASIX) 40 MG tablet, Take 40 mg by mouth., Disp: , Rfl:  .  ibuprofen (ADVIL) 800 MG tablet, Take 1 tablet (800 mg total) by mouth every 8 (eight) hours as needed., Disp: 30 tablet, Rfl: 0 .  montelukast (SINGULAIR) 10 MG tablet, Take 10 mg by mouth daily., Disp: , Rfl: 11 .  Topiramate ER (TROKENDI XR) 200 MG CP24, Take 1 capsule by mouth at bedtime., Disp: 30 capsule, Rfl: 3 .  venlafaxine XR (EFFEXOR XR) 150 MG 24 hr capsule, Take 1 capsule (150 mg total) by mouth daily with breakfast., Disp: 90 capsule, Rfl: 0 .  zolpidem (AMBIEN) 10 MG tablet, Take 1 tablet (10 mg total) by mouth at bedtime as needed for sleep., Disp:  30 tablet, Rfl: 2 .  oxybutynin (DITROPAN-XL) 10 MG 24 hr tablet, , Disp: , Rfl:  Medication Side Effects: none  Family Medical/ Social History: Changes? No  MENTAL HEALTH EXAM:  There were no vitals taken for this visit.There is no height or weight on file to calculate BMI.  General Appearance: Casual, Neat and Well Groomed  Eye Contact:  Good  Speech:  Clear and Coherent  Volume:  Normal  Mood:  Euthymic  Affect:  Appropriate  Thought Process:  Goal Directed and Descriptions of Associations: Intact  Orientation:  Full (Time, Place, and  Person)  Thought Content: Logical   Suicidal Thoughts:  No  Homicidal Thoughts:  No  Memory:  WNL  Judgement:  Good  Insight:  Good  Psychomotor Activity:  Normal  Concentration:  Concentration: Good  Recall:  Good  Fund of Knowledge: Good  Language: Good  Assets:  Desire for Improvement  ADL's:  Intact  Cognition: WNL  Prognosis:  Good    DIAGNOSES:    ICD-10-CM   1. Bipolar I disorder, most recent episode mixed ([facility])  F31.60   2. GAD (generalized anxiety disorder)  F41.1   3. Insomnia, unspecified type  G47.00     Receiving Psychotherapy: Yes With Rinaldo Cloud, LCSW   RECOMMENDATIONS:  PDMP reviewed.  I am glad she is doing better! We discussed different options for the anxiety.  If she needs the Xanax more often, she should give me a call and we will start BuSpar.  I went over the risks, benefits, and side effects with her today.  We agree that for now we will just watch and wait with the anxiety. Continue Xanax 1 mg, 1/2-1 twice daily as needed. Continue Abilify 20 mg daily. Continue Effexor X are 150 mg daily. Continue Ambien 10 mg 1 nightly as needed sleep. Sleep hygiene discussed. Continue therapy with Rinaldo Cloud, LCSW. Return in 3 months.  Donnal Moat, PA-C

## 2019-05-18 ENCOUNTER — Ambulatory Visit: Payer: 59 | Admitting: Psychiatry

## 2019-05-19 ENCOUNTER — Encounter: Payer: 59 | Admitting: Psychiatry

## 2019-05-20 NOTE — Progress Notes (Signed)
This encounter was created in error - please disregard.

## 2019-05-22 ENCOUNTER — Other Ambulatory Visit: Payer: Self-pay | Admitting: Neurology

## 2019-05-22 DIAGNOSIS — G43709 Chronic migraine without aura, not intractable, without status migrainosus: Secondary | ICD-10-CM

## 2019-05-22 MED FILL — AIMOVIG 70 MG/ML SOAJ: 70 | 30 days supply | Qty: 1 | Fill #0

## 2019-05-22 NOTE — Telephone Encounter (Signed)
Requested Prescriptions   Pending Prescriptions Disp Refills  . AIMOVIG 70 MG/ML SOAJ [Pharmacy Med Name: AIMOVIG 70 MG/ML SOAJ 70 SOAJ] 1 mL 5    Sig: INJECT 70 MG INTO THE SKIN EVERY 30 DAYS.   Rx last filled:05/20/2018 #1 11 refills  Pt last seen:12/11/18  Follow up appt scheduled:06/09/2019

## 2019-05-24 ENCOUNTER — Emergency Department (HOSPITAL_COMMUNITY)
Admission: EM | Admit: 2019-05-24 | Discharge: 2019-05-24 | Disposition: A | Discharge status: Home / Self Care | Payer: 59 | Attending: Emergency Medicine | Admitting: Emergency Medicine

## 2019-05-24 ENCOUNTER — Encounter (HOSPITAL_COMMUNITY): Payer: Self-pay

## 2019-05-24 ENCOUNTER — Other Ambulatory Visit: Payer: Self-pay

## 2019-05-24 ENCOUNTER — Ambulatory Visit (HOSPITAL_COMMUNITY)
Admission: EM | Admit: 2019-05-24 | Discharge: 2019-05-24 | Disposition: A | Discharge status: Home / Self Care | Payer: No Typology Code available for payment source | Source: Ambulatory Visit | Attending: Emergency Medicine | Admitting: Emergency Medicine

## 2019-05-24 DIAGNOSIS — T7421XA Adult sexual abuse, confirmed, initial encounter: Secondary | ICD-10-CM | POA: Diagnosis not present

## 2019-05-24 DIAGNOSIS — Z79899 Other long term (current) drug therapy: Secondary | ICD-10-CM | POA: Diagnosis not present

## 2019-05-24 DIAGNOSIS — Z0441 Encounter for examination and observation following alleged adult rape: Secondary | ICD-10-CM | POA: Insufficient documentation

## 2019-05-24 DIAGNOSIS — J45909 Unspecified asthma, uncomplicated: Secondary | ICD-10-CM | POA: Diagnosis not present

## 2019-05-24 LAB — POC URINE PREG, ED: Preg Test, Ur: NEGATIVE

## 2019-05-24 MED ORDER — CEFTRIAXONE SODIUM 250 MG IJ SOLR
250.0000 mg | Freq: Once | INTRAMUSCULAR | Status: AC
  Administered 2019-05-24: 250 mg via INTRAMUSCULAR
  Filled 2019-05-24: qty 250

## 2019-05-24 MED ORDER — LIDOCAINE HCL (PF) 1 % IJ SOLN
0.9000 mL | Freq: Once | INTRAMUSCULAR | Status: AC
  Administered 2019-05-24: 0.9 mL
  Filled 2019-05-24: qty 2

## 2019-05-24 MED ORDER — ACETAMINOPHEN 325 MG PO TABS
650.0000 mg | ORAL_TABLET | Freq: Once | ORAL | Status: AC
  Administered 2019-05-24: 650 mg via ORAL
  Filled 2019-05-24: qty 2

## 2019-05-24 MED ORDER — AZITHROMYCIN 250 MG PO TABS
1000.0000 mg | ORAL_TABLET | Freq: Once | ORAL | Status: AC
  Administered 2019-05-24: 1000 mg via ORAL
  Filled 2019-05-24: qty 4

## 2019-05-24 MED ORDER — ULIPRISTAL ACETATE 30 MG PO TABS
30.0000 mg | ORAL_TABLET | Freq: Once | ORAL | Status: AC
  Administered 2019-05-24: 30 mg via ORAL
  Filled 2019-05-24: qty 1

## 2019-05-24 MED ORDER — METRONIDAZOLE 500 MG PO TABS
2000.0000 mg | ORAL_TABLET | Freq: Once | ORAL | Status: AC
  Administered 2019-05-24: 2000 mg via ORAL
  Filled 2019-05-24: qty 4
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
## 2019-05-24 NOTE — ED Notes (Signed)
Placed ordered medications with [facility] nurse who advised to give meds to patient.
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
## 2019-05-24 NOTE — Discharge Instructions (Addendum)
Discharge instructions per SANE nurse.
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
## 2019-05-26 ENCOUNTER — Telehealth: Payer: Self-pay | Admitting: Physician Assistant

## 2019-05-26 NOTE — Telephone Encounter (Signed)
She had a traumatic event recently and needs to discuss FMLA.

## 2019-05-27 ENCOUNTER — Ambulatory Visit (INDEPENDENT_AMBULATORY_CARE_PROVIDER_SITE_OTHER): Payer: 59 | Admitting: Psychiatry

## 2019-05-27 DIAGNOSIS — F411 Generalized anxiety disorder: Secondary | ICD-10-CM

## 2019-05-27 NOTE — Telephone Encounter (Signed)
Advised her to have HR send over paper work and give [place] dates she is needing and let us know. Fax number given and she will get back with [place] on information and appreciative.

## 2019-05-27 NOTE — Telephone Encounter (Signed)
I'm sorry to hear this.  I will be glad to approve FMLA continuous or whatever she needs.  Ask her the dates she needs to be out, when she will go back, etc.  And fax whatever forms I need to complete. Thanks.
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
## 2019-06-02 ENCOUNTER — Ambulatory Visit: Payer: 59 | Admitting: Psychiatry

## 2019-06-04 DIAGNOSIS — Z113 Encounter for screening for infections with a predominantly sexual mode of transmission: Secondary | ICD-10-CM | POA: Diagnosis not present

## 2019-06-08 ENCOUNTER — Encounter: Payer: Self-pay | Admitting: Neurology

## 2019-06-08 NOTE — Progress Notes (Signed)
Virtual Visit via Video Note The purpose of this virtual visit is to provide medical care while limiting exposure to the novel coronavirus.    Consent was obtained for video visit:  Yes.   Answered questions that patient had about telehealth interaction:  Yes.   I discussed the limitations, risks, security and privacy concerns of performing an evaluation and management service by telemedicine. I also discussed with the patient that there may be a patient responsible charge related to this service. The patient expressed understanding and agreed to proceed.  Pt location: Home Physician Location: office Name of referring provider:  Assunta Found, MD I connected with Karl Pock Weed at patients initiation/request on 06/09/2019 at  2:30 PM EST by video enabled telemedicine application and verified that I am speaking with the correct person using two identifiers. Pt MRN:  [id number] Pt DOB:  19-Oct-1975 Video Participants:  Karl Pock Orourke   History of Present Illness:  Shannon Gould is a 67 year oldleft-handed female withgeneralized anxiety disorderand majordepressionwho follows up for migraines.  UPDATE: Due to side effects, topiramate was switched to ER.  She still has mild paresthesias in her fingers, but otherwise doing much better.  Intensity:  Moderate Duration:  2 hours with Relpax.  Does not respond to Maxalt. Frequency:  2-3 headache days a week.  No severe migraines. Current NSAIDS:Ibuprofen 600mg  Current analgesics:none Current triptans:Relpax 40 mg Current ergotamine:None Current anti-emetic:none Current muscle relaxants:None Current anti-anxiolytic:Xanax Current sleep aide:Ambien Current Antihypertensive medications:None Current Antidepressant medications:Venlafaxine XL 225 mg daily, Abilify Current Anticonvulsant medications:topiramate ER 200mg  at bedtime Current anti-CGRP:Aimovig 70mg   Current Vitamins/Herbal/Supplements:Vitamin D Current  Antihistamines/Decongestants:None Other therapy:None Hormone/birth control:Mirena  Caffeine:no Alcohol:occasional Smoker:no Diet:hydrates Exercise:no Depression:yes; Anxiety:Yes. She sees a for therapy. Other pain:Suboccipital pain Sleep hygiene:Insomnia. Uses sonata and weighted blanket.  HISTORY: Onset:  47s Location:Usually left sided Quality:pounding Initial Intensity:6/10.[name] new headache, thunderclap headache or severe headache that wakes herfrom sleep. Aura:no Prodrome:no Postdrome:no Associated symptoms:  Nausea, photophobia, phonophobia, blurred vision, left eye droops, osmophobia.[name] associated vomiting orunilateral numbness or weakness. Initial Duration:1 day Initial Frequency:Once a month Initial Frequency of abortive medication:once or twice a month Triggers:  Caffeine Relieving factors:  Laying down Activity:Aggravates She also reported near daily dull bifrontal headaches which she treats with ibuprofen 600mg .  CT of head from 01/25/2015 was personally reviewed and revealed no acute intracranial abnormalities.  Past NSAIDS:Ibuprofen, naproxen Past analgesics:Tylenol, Excedrin, Ultracet Past abortive triptans:Maxalt 5 mg, Sumatriptan tablet (caused nausea) Past abortive ergotamine:none Past muscle relaxants:none Past anti-emetic:none Past antihypertensive medications:none Past antidepressant medications:Wellbutrin, Celexa, Zoloft 100 mg Past anticonvulsant medications:none Past anti-CGRP:none Past vitamins/Herbal/Supplements:none Past antihistamines/decongestants:none Other past therapies:none  Past Medical History: Past Medical History:  Diagnosis Date  . Anemia    hx of patient reports   . Anxiety   . Asthma   . Depression   . GERD (gastroesophageal reflux disease)   . Head ache     Medications: Outpatient Encounter Medications as of 06/09/2019    Medication Sig  . AIMOVIG 70 MG/ML SOAJ INJECT 70 MG INTO THE SKIN EVERY 30 DAYS. (Patient taking differently: Inject 70 mg into the skin every 30 (thirty) days. )  . albuterol (PROVENTIL HFA) 108 (90 BASE) MCG/ACT inhaler Inhale 2 puffs into the lungs every 6 (six) hours as needed. For shortness of breath   . ALPRAZolam (XANAX) 1 MG tablet TAKE 1/2 TO 1 TABLET BY MOUTH TWICE A DAY AS NEEDED (Patient taking differently: Take 0.05 mg by mouth 2 (two) times daily as  needed for anxiety. TAKE 1/2 TO 1 TABLET BY MOUTH TWICE A DAY AS NEEDED)  . ARIPiprazole (ABILIFY) 20 MG tablet Take 1 tablet (20 mg total) by mouth daily.  Marland Kitchen eletriptan (RELPAX) 40 MG tablet Take 1 tablet for migraine.  May repeat in 2 hours if headache persists or recurs.  Max 2 tablets in 24hrs (Patient taking differently: Take 40 mg by mouth every 2 (two) hours as needed. Take 1 tablet for migraine.  May repeat in 2 hours if headache persists or recurs.  Max 2 tablets in 24hrs)  . furosemide (LASIX) 40 MG tablet Take 40 mg by mouth daily as needed.   Marland Kitchen ibuprofen (ADVIL) 800 MG tablet Take 1 tablet (800 mg total) by mouth every 8 (eight) hours as needed.  Marland Kitchen levonorgestrel (MIRENA, 52 MG,) 20 MCG/24HR IUD 1 each by Intrauterine route once.   . montelukast (SINGULAIR) 10 MG tablet Take 10 mg by mouth daily as needed (shortness of breath).   Marland Kitchen oxybutynin (DITROPAN-XL) 10 MG 24 hr tablet   . TROKENDI XR 200 MG CP24 TAKE 1 CAPSULE BY MOUTH AT BEDTIME. (Patient taking differently: Take 1 capsule by mouth daily. )  . venlafaxine XR (EFFEXOR XR) 150 MG 24 hr capsule Take 1 capsule (150 mg total) by mouth daily with breakfast.  . zolpidem (AMBIEN) 10 MG tablet Take 1 tablet (10 mg total) by mouth at bedtime as needed for sleep. (Patient taking differently: Take 10 mg by mouth at bedtime. )   No facility-administered encounter medications on file as of 06/09/2019.    Allergies: Allergies  Allergen Reactions  . Hydrocodone Itching  .  Wellbutrin [Bupropion]   . Caffeine Other (See Comments)    Headache   . Lactose Intolerance (Gi) Other (See Comments)    gas    Family History: Family History  Problem Relation Age of Onset  . Hypertension Mother   . Diabetes Mother   . Heart disease Father   . Sleep apnea Father   . Diabetes Father   . Heart attack Father        x2  . Heart disease Sister   . Anemia Sister   . Hypertension Sister   . Heart disease Brother   . Hypertension Brother   . Seizures Paternal Grandmother   . Anxiety disorder Daughter   . Depression Daughter   . Asthma Daughter   . Asthma Son   . Asthma Son     Social History: Social History   Socioeconomic History  . Marital status: Single    Spouse name: Not on file  . Number of children: 3  . Years of education: Not on file  . Highest education level: Associate degree: occupational, Scientist, product/process development, or vocational program  Occupational History  . Occupation: LPN    Employer: [employer]  Tobacco Use  . Smoking status: Never Smoker  . Smokeless tobacco: Never Used  Substance and Sexual Activity  . Alcohol use: Yes    Alcohol/week: 0.0 - 1.0 standard drinks    Comment: 2 x per month.  . Drug use: No  . Sexual activity: Yes    Birth control/protection: I.U.D.  Other Topics Concern  . Not on file  Social History Narrative   Patient is left-handed. She lives in a one level home with 2 of her children, one is at college. She avoids caffeine. She does not exercise.   Social Determinants of Health   Financial Resource Strain:   . Difficulty of Paying  Living Expenses: Not on file  Food Insecurity:   . Worried About Charity fundraiser in the Last Year: Not on file  . Ran Out of Food in the Last Year: Not on file  Transportation Needs:   . Lack of Transportation (Medical): Not on file  . Lack of Transportation (Non-Medical): Not on file  Physical Activity:   . Days of Exercise per Week: Not on file  . Minutes of Exercise per  Session: Not on file  Stress:   . Feeling of Stress : Not on file  Social Connections:   . Frequency of Communication with Friends and Family: Not on file  . Frequency of Social Gatherings with Friends and Family: Not on file  . Attends Religious Services: Not on file  . Active Member of Clubs or Organizations: Not on file  . Attends Archivist Meetings: Not on file  . Marital Status: Not on file  Intimate Partner Violence:   . Fear of Current or Ex-Partner: Not on file  . Emotionally Abused: Not on file  . Physically Abused: Not on file  . Sexually Abused: Not on file    Observations/Objective:   Height 5\' 3"  (1.6 m), weight 211 lb (95.7 kg). No acute distress.  Alert and oriented.  Speech fluent and not dysarthric.  Language intact.  Eyes orthophoric on primary gaze.  Face symmetric.  Assessment and Plan:   Migraine without aura, without status migrainosus, not intractable  1.  For preventative management, Aimovig 70mg  and topiramate ER 200mg  at bedtime 2.  For abortive therapy, Relpax 40mg  3.  Limit use of pain relievers to no more than 2 days out of week to prevent risk of rebound or medication-overuse headache. 4.  Keep headache diary 5.  Exercise, hydration, caffeine cessation, sleep hygiene, monitor for and avoid triggers 6.  Consider:  magnesium citrate 400mg  daily, riboflavin 400mg  daily, and coenzyme Q10 100mg  three times daily 7. Follow up 9 months   Follow Up Instructions:    -I discussed the assessment and treatment plan with the patient. The patient was provided an opportunity to ask questions and all were answered. The patient agreed with the plan and demonstrated an understanding of the instructions.   The patient was advised to call back or seek an in-person evaluation if the symptoms worsen or if the condition fails to improve as anticipated.    Dudley Major, DO

## 2019-06-09 ENCOUNTER — Ambulatory Visit (INDEPENDENT_AMBULATORY_CARE_PROVIDER_SITE_OTHER): Payer: 59 | Admitting: Psychiatry

## 2019-06-09 ENCOUNTER — Telehealth (INDEPENDENT_AMBULATORY_CARE_PROVIDER_SITE_OTHER): Payer: 59 | Admitting: Neurology

## 2019-06-09 ENCOUNTER — Encounter: Payer: Self-pay | Admitting: Neurology

## 2019-06-09 ENCOUNTER — Other Ambulatory Visit: Payer: Self-pay | Admitting: Physician Assistant

## 2019-06-09 ENCOUNTER — Telehealth: Payer: Self-pay | Admitting: Physician Assistant

## 2019-06-09 ENCOUNTER — Other Ambulatory Visit: Payer: Self-pay

## 2019-06-09 VITALS — Ht 63.0 in | Wt 211.0 lb

## 2019-06-09 DIAGNOSIS — F411 Generalized anxiety disorder: Secondary | ICD-10-CM

## 2019-06-09 DIAGNOSIS — G43009 Migraine without aura, not intractable, without status migrainosus: Secondary | ICD-10-CM | POA: Diagnosis not present

## 2019-06-09 MED ORDER — MIRTAZAPINE 7.5 MG PO TABS: 7.5000 mg | ORAL_TABLET | Freq: Every evening | ORAL | 0 refills | Status: DC | PRN

## 2019-06-09 MED ORDER — AIMOVIG 70 MG/ML ~~LOC~~ SOAJ: 70.0000 mg | SUBCUTANEOUS | 11 refills | Status: DC

## 2019-06-09 MED FILL — MIRTAZAPINE 7.5 MG TABLET: 7.5 | 30 days supply | Qty: 30 | Fill #0

## 2019-06-09 NOTE — Telephone Encounter (Signed)
Pt. Made aware.

## 2019-06-09 NOTE — Telephone Encounter (Signed)
Please let her know that I've sent in Mirtazepine.  She can take it by itself of in combo w/ the Ambien.  Thanks
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
## 2019-06-09 NOTE — Telephone Encounter (Signed)
Patient was in the office today and said that her sleeping [place] medicine is not working and she needs something else to go with it. Please give her a call at [phone]. Her pharmacy is [facility].

## 2019-06-19 MED FILL — ARIPiprazole 20 MG TABS: 20 | 90 days supply | Qty: 90 | Fill #0

## 2019-06-19 MED FILL — VENLAFAXINE HCL ER 150 MG C: 150 | 90 days supply | Qty: 90 | Fill #0

## 2019-06-22 MED FILL — AIMOVIG 70 MG/ML SOAJ: 70 | 30 days supply | Qty: 1 | Fill #1

## 2019-06-24 ENCOUNTER — Ambulatory Visit: Payer: 59 | Admitting: Psychiatry

## 2019-07-07 ENCOUNTER — Ambulatory Visit: Payer: 59 | Admitting: Psychiatry

## 2019-07-28 MED FILL — ZOLPIDEM TARTRATE 10 MG TAB: 10 | 30 days supply | Qty: 30 | Fill #0

## 2019-07-28 MED FILL — AIMOVIG 70 MG/ML SOAJ: 70 | 30 days supply | Qty: 1 | Fill #2

## 2019-08-12 ENCOUNTER — Ambulatory Visit: Payer: 59 | Admitting: Psychiatry

## 2019-08-12 ENCOUNTER — Other Ambulatory Visit: Payer: Self-pay

## 2019-08-12 ENCOUNTER — Ambulatory Visit (INDEPENDENT_AMBULATORY_CARE_PROVIDER_SITE_OTHER): Payer: 59 | Admitting: Psychiatry

## 2019-08-12 DIAGNOSIS — F411 Generalized anxiety disorder: Secondary | ICD-10-CM

## 2019-08-12 NOTE — Progress Notes (Signed)
      [facility] Counselor/Therapist Progress Note  Patient ID: Shannon Gould, MRN: [id number],    Date: 08/12/2019  Time Spent: 60 minutes  8:00am to 9:00am  Treatment Type: Individual Therapy  Reported Symptoms: anxiety, some anxiety attacks at work and home, some depression  Mental Status Exam:  Appearance:   Neat     Behavior:  Appropriate, Sharing and Motivated  Motor:  Normal  Speech/Language:   Normal Rate  Affect:  anxious, depressed  Mood:  anxious, depressed and irritable  Thought process:  goal directed  Thought content:    WNL  Sensory/Perceptual disturbances:    WNL  Orientation:  oriented to person, place, time/date, situation, day of week, month of year and year  Attention:  Good/Fair  Concentration:  Good/Fair  Memory:  some occasional short term memory issues and is sharing with her PCP  Fund of knowledge:   Good  Insight:    Good  Judgment:   Good  Impulse Control:  Good   Risk Assessment: Danger to Self:  No Self-injurious Behavior: No Danger to Others: No Duty to Warn:no Physical Aggression / Violence:No  Access to Firearms a concern: No  Gang Involvement:No   Subjective: Patient in today reporting some increased anxiety in recent weeks and is not getting out quite as much due to Covid and "incident" several weeks ago.  Does feel she is improving some as "anxiety attacks have lessened, is taking better care of herself."    Interventions: Cognitive Behavioral Therapy and Solution-Oriented/Positive Psychology  Diagnosis:   ICD-10-CM   1. GAD (generalized anxiety disorder)  F41.1     Plan: Patient not signing tx plan on computer screen due to COVID-19.  Treatment Goals:  Treatment goals remain on tx plan as patient works on strategies to meet her goals. Progress is noted each session in the "Progress" section on tx Plan.  Long term goal: Reduce thoughts that trigger impulsive behavior and increase self-talk that helps control  behavior.   Short term goal: Use cognitive methods to control trigger thoughts and reduce impulsive reactions to those trigger thoughts.  Strategy: Help the patient uncover dysfunctional thoughts that lead to impulsivity. Replace each thought with a thought that is accurate, positive, self-enhancing, and adaptive.   Progress: Patient today reporting increased anxiety, "occasional anxiety attacks" have lessened some "except when I forget my morning dose of Abilify but that's not real often."  "No flashbacks of incident and my thoughts of it are less now."  Does still blame and second-guess herself some, often looking for what might go wrong versus right, and realizing there are some forgiveness issues involved ifor her to continue healing.  Very interested in the "forgiveness" and asked several questions about forgiving others and forgiving herself.  Sleep has improved some with the Remeron added (per T. Hurst, PA-C) but "still not real good." Medication had run out and we discussed being careful not to let that happen as she is reporting positive benefits from it.  Self-talk is critical and we worked today on her using more positive self-talk/ replacing the negative self-talk.  Seems some more confident and working to be more self-caring and setting appropriate boundaries with others.  Working to decrease guilt and elevate self-esteem and self-acceptance.   Goal review and progress/challenges/efforts noted with patient.  Next appt within 2 weeks.   Mathis Fare, LCSW

## 2019-08-14 ENCOUNTER — Ambulatory Visit: Payer: 59 | Admitting: Physician Assistant

## 2019-08-19 ENCOUNTER — Other Ambulatory Visit: Payer: Self-pay

## 2019-08-19 ENCOUNTER — Ambulatory Visit (INDEPENDENT_AMBULATORY_CARE_PROVIDER_SITE_OTHER): Payer: 59 | Admitting: Psychiatry

## 2019-08-19 DIAGNOSIS — E6609 Other obesity due to excess calories: Secondary | ICD-10-CM | POA: Diagnosis not present

## 2019-08-19 DIAGNOSIS — F411 Generalized anxiety disorder: Secondary | ICD-10-CM

## 2019-08-19 DIAGNOSIS — Z6833 Body mass index (BMI) 33.0-33.9, adult: Secondary | ICD-10-CM | POA: Diagnosis not present

## 2019-08-19 DIAGNOSIS — R319 Hematuria, unspecified: Secondary | ICD-10-CM | POA: Diagnosis not present

## 2019-08-19 DIAGNOSIS — N342 Other urethritis: Secondary | ICD-10-CM | POA: Diagnosis not present

## 2019-08-19 NOTE — Progress Notes (Signed)
[facility] Counselor/Therapist Progress Note  Patient ID: Shannon Gould, MRN: [id number],    Date: 08/19/2019  Time Spent: 60 minutes  8:00am to 9:00am  Treatment Type: Individual Therapy  Reported Symptoms: anxiety, depressed (has lessened some), much lower energy later in the day and naps often late in the day but still sleeps through the night.  Mental Status Exam:  Appearance:   Well Groomed     Behavior:  Appropriate, Sharing and Motivated  Motor:  Normal  Speech/Language:   Normal  Affect:  anxious, depressed, worries, more lethargic in afternoons  Mood:  anxious and depressed  Thought process:  goal directed  Thought content:    WNL  Sensory/Perceptual disturbances:    WNL  Orientation:  oriented to person, place, time/date, situation, day of week, month of year and year  Attention:  Good  Concentration:  Good/Fair  Memory:  "a little bit of forgetfulness and worse under stress"  Fund of knowledge:   Good  Insight:    Good  Judgment:   Good  Impulse Control:  Good   Risk Assessment: Danger to Self:  No Self-injurious Behavior: No Danger to Others: No Duty to Warn:no Physical Aggression / Violence:No  Access to Firearms a concern: No  Gang Involvement:No   Subjective: Patient today reports continued anxiety but depression is reduced. Try to talk myself through "these little anxiety attacks." Reports having them at work and at home and she doesn't notice what seems to trigger them. States I do try to tell myself each a.m. that "I'm not going to have an anxiety attack but on most days ends up having one."  May be over-emphasizing the "not having one" to where it almost works against her. States that walking sometimes helps her through the anxiety attack.    Interventions: Cognitive Behavioral Therapy and Ego-Supportive  Diagnosis:   ICD-10-CM   1. GAD (generalized anxiety disorder)  F41.1     Plan: Patient not signing tx plan on computer screen due to  COVID-19.  Treatment Goals:  Treatment goals remain on tx plan as patient works on strategies to meet her goals. Progress is noted each session in the "Progress" section on tx Plan.  Long term goal: Reduce thoughts that trigger impulsive behavior and increase self-talk that helps control behavior.   Short term goal: Use cognitive methods to control trigger thoughts and reduce impulsive reactions to those trigger thoughts.  Strategy: Help the patient uncover dysfunctional thoughts that lead to impulsivity. Replace each thought with a thought that is accurate, positive, self-enhancing, and adaptive.   Progress: Patient reports some increase in anxiety and is staying on all meds as prescribed. Still not having any flashbacks nor intrusive thoughts of prior incident with friend. She continues to work on her self-blame and it "has defnintely decreased." Feels she is making progress in trying to stop looking for what might go wrong all the time and practicing looking for the more positives in life.  Also to focus more on what she can control versus what she can't control. Patient is feeling more motivated and responds well to intervention.  Working to identify triggers to her anxious/negative thoughts and feelings more quickly.  To follow up on this as homework and we will pick up next session.  Goal review with patient and progress/challenges/efforts noted with her today.  Next appt to be within 2 weeks.    Mathis Fare, LCSW

## 2019-08-28 ENCOUNTER — Encounter: Payer: Self-pay | Admitting: Physician Assistant

## 2019-08-28 ENCOUNTER — Ambulatory Visit (INDEPENDENT_AMBULATORY_CARE_PROVIDER_SITE_OTHER): Payer: 59 | Admitting: Physician Assistant

## 2019-08-28 ENCOUNTER — Other Ambulatory Visit: Payer: Self-pay

## 2019-08-28 VITALS — BP 121/77 | HR 76

## 2019-08-28 DIAGNOSIS — F411 Generalized anxiety disorder: Secondary | ICD-10-CM | POA: Diagnosis not present

## 2019-08-28 DIAGNOSIS — F3160 Bipolar disorder, current episode mixed, unspecified: Secondary | ICD-10-CM | POA: Diagnosis not present

## 2019-08-28 DIAGNOSIS — F514 Sleep terrors [night terrors]: Secondary | ICD-10-CM

## 2019-08-28 DIAGNOSIS — F3341 Major depressive disorder, recurrent, in partial remission: Secondary | ICD-10-CM

## 2019-08-28 DIAGNOSIS — F410 Panic disorder [episodic paroxysmal anxiety] without agoraphobia: Secondary | ICD-10-CM | POA: Diagnosis not present

## 2019-08-28 MED ORDER — VENLAFAXINE HCL ER 75 MG PO CP24: 75.0000 mg | ORAL_CAPSULE | Freq: Every day | ORAL | 1 refills | Status: DC

## 2019-08-28 MED ORDER — MIRTAZAPINE 7.5 MG PO TABS: 7.5000 mg | ORAL_TABLET | Freq: Every evening | ORAL | 1 refills | Status: DC | PRN

## 2019-08-28 MED FILL — VENLAFAXINE HCL ER 75 MG CA: 75 | 30 days supply | Qty: 30 | Fill #0

## 2019-08-28 MED FILL — MIRTAZAPINE 7.5 MG TABLET: 7.5 | 90 days supply | Qty: 90 | Fill #0
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
## 2019-08-31 MED FILL — TROKENDI XR 200 MG CAPSULE: 200 | 90 days supply | Qty: 90 | Fill #1

## 2019-08-31 MED FILL — AIMOVIG 70 MG/ML SOAJ: 70 | 30 days supply | Qty: 1 | Fill #3

## 2019-08-31 MED FILL — RIZATRIPTAN 10 MG ODT: 10 | 30 days supply | Qty: 12 | Fill #0

## 2019-09-14 ENCOUNTER — Other Ambulatory Visit: Payer: Self-pay

## 2019-09-14 ENCOUNTER — Ambulatory Visit (INDEPENDENT_AMBULATORY_CARE_PROVIDER_SITE_OTHER): Payer: 59 | Admitting: Psychiatry

## 2019-09-14 DIAGNOSIS — F411 Generalized anxiety disorder: Secondary | ICD-10-CM
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
## 2019-09-23 ENCOUNTER — Other Ambulatory Visit: Payer: Self-pay | Admitting: Physician Assistant

## 2019-09-23 MED FILL — VENLAFAXINE HCL ER 150 MG C: 150 | 90 days supply | Qty: 90 | Fill #0

## 2019-09-23 MED FILL — ARIPiprazole 20 MG TABS: 20 | 90 days supply | Qty: 90 | Fill #0

## 2019-09-25 ENCOUNTER — Ambulatory Visit: Payer: 59 | Admitting: Physician Assistant

## 2019-09-28 ENCOUNTER — Other Ambulatory Visit: Payer: Self-pay

## 2019-09-28 ENCOUNTER — Ambulatory Visit (INDEPENDENT_AMBULATORY_CARE_PROVIDER_SITE_OTHER): Payer: 59 | Admitting: Psychiatry

## 2019-09-28 DIAGNOSIS — F411 Generalized anxiety disorder: Secondary | ICD-10-CM

## 2019-09-28 NOTE — Progress Notes (Signed)
      [facility] Counselor/Therapist Progress Note  Patient ID: Shannon Gould, MRN: [id number],    Date: 09/28/2019  Time Spent: 60 minutes  10:00am to 11:00am  Treatment Type: Individual Therapy  Reported Symptoms: anxiety, depression (has increased some)  Mental Status Exam:  Appearance:   Well Groomed     Behavior:  Appropriate, Sharing and Motivated  Motor:  Normal  Speech/Language:   Normal Rate  Affect:  anxious, depressed  Mood:  anxious, depressed  Thought process:  goal directed  Thought content:    WNL  Sensory/Perceptual disturbances:    WNL  Orientation:  oriented to person, place, time/date, situation, day of week, month of year and year  Attention:  Good/Fair  Concentration:  Good/Fair  Memory:  WNL  Fund of knowledge:   Good  Insight:    Good  Judgment:   Good  Impulse Control:  Good   Risk Assessment: Danger to Self:  No Self-injurious Behavior: No Danger to Others: No Duty to Warn:no Physical Aggression / Violence:No  Access to Firearms a concern: No  Gang Involvement:No   Subjective: Patient in today working on anxiety and depression, which has increased some more recently.  Interventions: Cognitive Behavioral Therapy and Ego-Supportive  Diagnosis:   ICD-10-CM   1. GAD (generalized anxiety disorder)  F41.1     Plan: Patient not signing tx plan on computer screen due to COVID-19.  Treatment Goals:  Treatment goals remain on tx plan as patient works on strategies to meet her goals. Progress is noted each session in the "Progress" section on tx Plan.  Long term goal: Reduce thoughts that trigger impulsive behavior and increase self-talk that helps control behavior.   Short term goal: Use cognitive methods to control trigger thoughts and reduce impulsive reactions to those trigger thoughts.  Strategy: Help the patient uncover dysfunctional thoughts that lead to impulsivity. Replace each thought with a thought that is accurate,  positive, self-enhancing, and adaptive.   Progress: Patient in today reporting anxiety, and depression with some increase in her depression recently.  Shannon Gould out with friends Saturday and had an "anxiety attack" while out.  Forgot to try the deep breathing exercises but the anxiety never really got too bad."  Dealing with some issues with a coworker who "is loud, distracting, doesn't listen well, and experiences this person a being very pushy at times.  States her anxiety is worse than her depression. Talked at length about her symptoms and how many anxious thoughts she has that are difficult to confront.  Looked together at how her anxious thoughts are not reality-based but "feel like they could be reality-based."  Worked on repetitive anxious thoughts, to identify them more quickly and replace them with more calming, reality-based, and empowering thoughts that do not support anxiety nor depression. To work more on good self-care, intercepting anxious thoughts and work on replacing them as above.  Also to use slow deliberate deep breathing exercise as they have been helpful before.  Patient does state that she feels her depression has increased some as her anxiety increased some. Denies any SI. Encouraged getting outside some every day, contact with friends, exercise, setting appropriate boundaries especially at work, and allowing time for enough sleep on regular basis, and practicing positive self-talk.    Goal review and progress noted with patient.  Next session within 2 weeks.   Mathis Fare, LCSW

## 2019-10-02 MED FILL — AIMOVIG 70 MG/ML SOAJ: 70 | 30 days supply | Qty: 1 | Fill #4

## 2019-10-12 ENCOUNTER — Ambulatory Visit (INDEPENDENT_AMBULATORY_CARE_PROVIDER_SITE_OTHER): Payer: 59 | Admitting: Psychiatry

## 2019-10-12 DIAGNOSIS — F411 Generalized anxiety disorder: Secondary | ICD-10-CM | POA: Diagnosis not present

## 2019-10-12 NOTE — Progress Notes (Signed)
      [facility] Counselor/Therapist Progress Note  Patient ID: MAHIYA KERCHEVAL, MRN: [id number],    Date: 10/12/2019  Time Spent: 50  minutes    9:10am to 10:00am    Treatment Type: Individual Therapy  Reported Symptoms: anxious  Mental Status Exam:  Appearance:   n/a  telehealth     Behavior:  Sharing  Motor:  n/a  telehealth  Speech/Language:   Normal Rate  Affect:  n/a  telehealth  Mood:  anxious  Thought process:  goal directed  Thought content:    WNL  Sensory/Perceptual disturbances:    WNL  Orientation:  oriented to person, place, time/date, situation, day of week, month of year and year  Attention:  Good/Fair  Concentration:  Good and Fair  Memory:  WNL  Fund of knowledge:   Good  Insight:    Good  Judgment:   Good  Impulse Control:  Good   Risk Assessment: Danger to Self:  No Self-injurious Behavior: No Danger to Others: No Duty to Warn:no Physical Aggression / Violence:No  Access to Firearms a concern: No  Gang Involvement:No   Subjective: Patient in today with anxiety and depression but both have decreased.  To work more on interrupting anxious thoughts and replacing them with more positive, reality-based, and empowering thoughts that do not support anxiety.  Interventions: Cognitive Behavioral Therapy and Solution-Oriented/Positive Psychology  Diagnosis:   ICD-10-CM   1. GAD (generalized anxiety disorder)  F41.1      Plan: Patient not signing tx plan on computer screen due to COVID-19.  Treatment Goals:  Treatment goals remain on tx plan as patient works on strategies to meet her goals. Progress is noted each session in the "Progress" section on tx Plan.  Long term goal: Reduce thoughts that trigger impulsive behavior and increase self-talk that helps control behavior.   Short term goal: Use cognitive methods to control trigger thoughts and reduce impulsive reactions to those trigger thoughts.  Strategy: Help the patient uncover  dysfunctional thoughts that lead to impulsivity. Replace each thought with a thought that is accurate, positive, self-enhancing, and adaptive.   Progress: Patient today reporting that her anxiety attacks have been less. But had some heart palpatations recently "and they went away, but has "spoken with doctor who felt it was stress and has the option to get an EKG if needed."  Right now, rates herself a "4" on 1-10 anxiety scale and a "7" at other times this past weekend. Depression also decreased some. Some strategies have helped her with the anxiety including deep breathing exercises, getting out more, and praying.  She feels depression has lessened as the anxiety has lessened. Still having anxious thoughts especially in close spaces, out in traffic, elevators.  Reviewed some of her more recent anxious thoughts and worked on understanding process better for  Intercepting the anxious thoughts and replacing them with more positive, reality-based, and empowering. Encouraged her to practice this thought-replacement more between now and next session, and increase her positive self-talk.Goa  Goal review and progress note with patient.  Next appt within 2 weeks.   Mathis Fare, LCSW

## 2019-10-26 ENCOUNTER — Ambulatory Visit (INDEPENDENT_AMBULATORY_CARE_PROVIDER_SITE_OTHER): Payer: 59 | Admitting: Psychiatry

## 2019-10-26 ENCOUNTER — Other Ambulatory Visit: Payer: Self-pay

## 2019-10-26 DIAGNOSIS — F411 Generalized anxiety disorder: Secondary | ICD-10-CM | POA: Diagnosis not present

## 2019-10-26 NOTE — Progress Notes (Addendum)
      [facility] Counselor/Therapist Progress Note  Patient ID: Shannon Gould, MRN: [id number],    Date: 10/26/2019  Time Spent: 60 minutes  9:05am to 10:05am  Treatment Type: Individual Therapy  Reported Symptoms: depression, anxiety  Mental Status Exam:  Appearance:   Casual     Behavior:  Appropriate and Sharing  Motor:  Normal  Speech/Language:   Normal Rate  Affect:  anxious, depressed  Mood:  anxious and depressed  Thought process:  goal directed  Thought content:    WNL  Sensory/Perceptual disturbances:    WNL  Orientation:  oriented to person, place, time/date, situation, day of week, month of year and year  Attention:  Good  Concentration:  Good  Memory:  WNL  Fund of knowledge:   Good  Insight:    Good  Judgment:   Good  Impulse Control:  Good   Risk Assessment: Danger to Self:  No Self-injurious Behavior: No Danger to Others: No Duty to Warn:no Physical Aggression / Violence:No  Access to Firearms a concern: No  Gang Involvement:No   Subjective: Patient today reporting anxiety and depression, with triggers at home and at work.  Interventions: Cognitive Behavioral Therapy and Ego-Supportive  Diagnosis:   ICD-10-CM   1. GAD (generalized anxiety disorder)  F41.1     Plan: Patient not signing tx plan on computer screen due to COVID-19.  Treatment Goals:  Treatment goals remain on tx plan as patient works on strategies to meet her goals. Progress is noted each session in the "Progress" section on tx Plan.  Long term goal: Reduce thoughts that trigger impulsive behavior and increase self-talk that helps control behavior.   Short term goal: Use cognitive methods to control trigger thoughts and reduce impulsive reactions to those trigger thoughts.  Strategy: Help the patient uncover dysfunctional thoughts that lead to impulsivity. Replace each thought with a thought that is accurate, positive, self-enhancing, and adaptive.    Progress: Patient in today reporting anxiety and depression with "my depression being a little stronger than my anxiety more recently."  No anxiety attacks since last visit, but some anxiety. Rates self a "4" on Anxiety and Depression 1-10 scales right now.  Walked some on a trail this weekend. Patient feels some of her depression is "stress related due to home and work."  Child support is to start back up in June per recent court.  Today worked on anxious and fearful thoughts, recognizing triggers more quickly, challenging the thoughts/fears, and replacing those thoughts with more positive, reality-based, and empowering thoughts that do not support anxiety or fearfulness.  Denies any SI. To keep practicing between sessions and to work to improve her self-talk to be more positive and encouraging.   Goal review and progress noted.  Next appt within 2 weeks.   Mathis Fare, LCSW

## 2019-11-03 MED FILL — VENLAFAXINE HCL ER 75 MG CA: 75 | 30 days supply | Qty: 30 | Fill #1

## 2019-11-03 MED FILL — AIMOVIG 70 MG/ML SOAJ: 70 | 30 days supply | Qty: 1 | Fill #5

## 2019-11-06 DIAGNOSIS — H5213 Myopia, bilateral: Secondary | ICD-10-CM | POA: Diagnosis not present

## 2019-11-06 DIAGNOSIS — H53022 Refractive amblyopia, left eye: Secondary | ICD-10-CM | POA: Diagnosis not present

## 2019-11-06 DIAGNOSIS — H52223 Regular astigmatism, bilateral: Secondary | ICD-10-CM | POA: Diagnosis not present

## 2019-11-09 ENCOUNTER — Ambulatory Visit (INDEPENDENT_AMBULATORY_CARE_PROVIDER_SITE_OTHER): Payer: 59 | Admitting: Psychiatry

## 2019-11-09 ENCOUNTER — Encounter: Payer: Self-pay | Admitting: Neurology

## 2019-11-09 ENCOUNTER — Other Ambulatory Visit: Payer: Self-pay

## 2019-11-09 DIAGNOSIS — F411 Generalized anxiety disorder: Secondary | ICD-10-CM | POA: Diagnosis not present

## 2019-11-09 NOTE — Progress Notes (Addendum)
Shannon Gould (Key: [id number]) Aimovig 70MG /ML auto-injectors   Form [employer] ePA Form 2017 NCPDP Created 2 days ago Sent to Plan 3 minutes ago Plan Response 3 minutes ago Submit Clinical Questions 1 minute ago Determination Favorable less than a minute ago Message from Plan The request has been approved. The authorization is effective for a maximum of 12 fills from 11/09/2019 to 11/07/2020, as long as the member is enrolled in their current health plan. This has been approved for a quantity limit of 1.0 with a day supply limit of 30.0. A written notification letter will follow with additional details.

## 2019-11-09 NOTE — Progress Notes (Signed)
      [facility] Counselor/Therapist Progress Note  Patient ID: Shannon Gould, MRN: [id number],    Date: 11/09/2019  Time Spent: 50 minutes 9:10am to 10:00am  Treatment Type: Individual Therapy  Reported Symptoms: anxiety,depression  Mental Status Exam:  Appearance:   Casual     Behavior:  Appropriate, Sharing and Motivated  Motor:  Normal  Speech/Language:   Normal Rate  Affect:  anxious  Mood:  anxious and some depression, "but mostly anxiety"  Thought process:  goal directed  Thought content:    WNL  Sensory/Perceptual disturbances:    WNL  Orientation:  oriented to person, place, time/date, situation, day of week, month of year and year  Attention:  Good/Fair  Concentration:  Good and Fair  Memory:  forgetfulness "at times, worse under stress"  Fund of knowledge:   Good  Insight:    Good  Judgment:   Good and Fair  Impulse Control:  Good   Risk Assessment: Danger to Self:  No Self-injurious Behavior: No Danger to Others: No Duty to Warn:no Physical Aggression / Violence:No  Access to Firearms a concern: No  Gang Involvement:No   Subjective: Patient reporting anxiety and depression, "anxiety is worse but depression is better".   Interventions: Cognitive Behavioral Therapy, Solution-Oriented/Positive Psychology and Ego-Supportive  Diagnosis:   ICD-10-CM   1. GAD (generalized anxiety disorder)  F41.1      Plan: Patient not signing tx plan on computer screen due to COVID-19.  Treatment Goals:  Treatment goals remain on tx plan as patient works on strategies to meet her goals. Progress is noted each session in the "Progress" section on tx Plan.  Long term goal: Reduce thoughts that trigger impulsive behavior and increase self-talk that helps control behavior.   Short term goal: Use cognitive methods to control trigger thoughts and reduce impulsive reactions to those trigger thoughts.  Strategy: Help the patient uncover dysfunctional thoughts  that lead to impulsivity. Replace each thought with a thought that is accurate, positive, self-enhancing, and adaptive.   Progress: Patient in today with anxiety and depression, adding that she feels like "anxiety is my security blanket."  On 1-10 scale, rates self a "6" due to various stressors right now.  Feels like her anxiety protects her in midst of stress.  Process her thoughts about this in session today, and how anxiety actually accentuates her feelings and responses versus providing any "protection".  Worked with several examples she shared and was able to help patient see more clearly the adverse effects of anxiety. Using goal above about increasing positive self-talk, patient is to practice the improved self talk and the letting go of anxiety as discussed in session.  Will pick up on this next session and she spoke up to add that she wants to share this with her children as it may help them also. Encouraged to have some physical exercise daily also, as that has proven to help in more recent past.  Goal review and progress noted with patient.  Next appt within 2 weeks.    Mathis Fare, LCSW

## 2019-11-23 ENCOUNTER — Ambulatory Visit (INDEPENDENT_AMBULATORY_CARE_PROVIDER_SITE_OTHER): Payer: 59 | Admitting: Psychiatry

## 2019-11-23 ENCOUNTER — Other Ambulatory Visit: Payer: Self-pay

## 2019-11-23 DIAGNOSIS — F411 Generalized anxiety disorder: Secondary | ICD-10-CM

## 2019-11-23 NOTE — Progress Notes (Signed)
      [facility] Counselor/Therapist Progress Note  Patient ID: Shannon Gould, MRN: [id number],    Date: 11/23/2019  Time Spent: 60 minutes  9:00am to 10:00am  Treatment Type: Individual Therapy  Reported Symptoms: anxiety, "worry", "certain fears get worse sometimes" , some depression "but anxiety is my main symptom."  Mental Status Exam:  Appearance:   Casual     Behavior:  Appropriate and Sharing  Motor:  Normal  Speech/Language:   Normal Rate  Affect:  anxiety, some depression  Mood:  anxious and some depression  Thought process:  goal directed  Thought content:    some irrational fears  Sensory/Perceptual disturbances:    WNL  Orientation:  oriented to person, place, time/date, situation, day of week, month of year and year  Attention:  Good/Fair  Concentration:  Good, Fair sometimes  Memory:  WNL  Fund of knowledge:   Good  Insight:    Fair  Judgment:   Good and Fair  Impulse Control:  Good   Risk Assessment: Danger to Self:  No Self-injurious Behavior: No Danger to Others: No Duty to Warn:no Physical Aggression / Violence:No  Access to Firearms a concern: No  Gang Involvement:No   Subjective: Patient today reports anxiety, irrational thoughts that lead to fear, some depression, and some worsening of irrational fears, "worrying".  Interventions: Cognitive Behavioral Therapy and Solution-Oriented/Positive Psychology  Diagnosis:   ICD-10-CM   1. GAD (generalized anxiety disorder)  F41.1      Plan: Patient not signing tx plan on computer screen due to COVID-19.  Treatment Goals:  Treatment goals remain on tx plan as patient works on strategies to meet her goals. Progress is noted each session in the "Progress" section on tx Plan.  Long term goal: Reduce thoughts that trigger impulsive behavior and increase self-talk that helps control behavior.   Short term goal: Use cognitive methods to control trigger thoughts and reduce impulsive reactions  to those trigger thoughts.  Strategy: Help the patient uncover dysfunctional thoughts that lead to impulsivity. Replace each thought with a thought that is accurate, positive, self-enhancing, and adaptive.   Progress: Patient in today reporting stronger anxiety due to "pandemic, the way things are in the world today" and some increased in irrational thoughts.  Discussed the irrational thoughts in more detail today especially the one relating to elevators and cars.  Patient shared her fears and explained how she has held onto the irrational thoughts as "they might help keep me safe."  Talked through the destructiveness and how irrational thoughts can affect our being able to live more free and less distressed by those thoughts.  She is to self-monitor more for anxious/irrational thoughts between now and next session and to work on intercepting those thoughts, challenging them, and replacing them with more reality-based, positive, and empowering thoughts that do not lead to anxiety nor fears. On 1-10 scaled of Anxiety, today she reports she "was a 7 initially but is a "5-6" by session end.  To continue healthy strategies (emotionally and physically, in addition to working to alleviate the anxious/irrational thoughts.  Goal review and progress/challenges noted with patient.  Next appt within 2 weeks.    Mathis Fare, LCSW

## 2019-12-08 ENCOUNTER — Ambulatory Visit: Payer: 59 | Admitting: Psychiatry

## 2019-12-09 ENCOUNTER — Other Ambulatory Visit: Payer: Self-pay | Admitting: Physician Assistant

## 2019-12-09 MED FILL — AIMOVIG 70 MG/ML SOAJ: 70 | 30 days supply | Qty: 1 | Fill #0

## 2019-12-09 MED FILL — ZOLPIDEM TARTRATE 10 MG TAB: 10 | 30 days supply | Qty: 30 | Fill #0

## 2019-12-09 NOTE — Telephone Encounter (Signed)
Last apt 08/28/2019 was due back 4 weeks, not scheduled

## 2019-12-21 ENCOUNTER — Ambulatory Visit (INDEPENDENT_AMBULATORY_CARE_PROVIDER_SITE_OTHER): Payer: 59 | Admitting: Psychiatry

## 2019-12-21 ENCOUNTER — Other Ambulatory Visit: Payer: Self-pay

## 2019-12-21 DIAGNOSIS — F411 Generalized anxiety disorder: Secondary | ICD-10-CM

## 2019-12-21 NOTE — Progress Notes (Signed)
      [facility] Counselor/Therapist Progress Note  Patient ID: Shannon Gould, MRN: [id number],    Date: 12/21/2019  Time Spent: 50 minutes   10:10am to 11:00am  Treatment Type: Individual Therapy  Reported Symptoms: anxiety, depression "a little more than before"  Mental Status Exam:  Appearance:   Casual     Behavior:  Appropriate and Sharing  Motor:  Normal  Speech/Language:   Normal Rate  Affect:  anxious, depressed  Mood:  anxious and depressed  Thought process:  goal directed  Thought content:    WNL  Sensory/Perceptual disturbances:    WNL  Orientation:  oriented to person, place, time/date, situation, day of week, month of year and year  Attention:  Good  Concentration:  Good  Memory:  WNL  Fund of knowledge:   Good  Insight:    Fair  Judgment:   Fair  Impulse Control:  Good   Risk Assessment: Danger to Self:  No Self-injurious Behavior: No Danger to Others: No Duty to Warn:no Physical Aggression / Violence:No  Access to Firearms a concern: No  Gang Involvement:No   Subjective: Patient today reporting anxiety, some increased depression "and not sure why" except for the fact "my son left for college and other son just had knee surgery.  Interventions: Cognitive Behavioral Therapy and Solution-Oriented/Positive Psychology  Diagnosis:   ICD-10-CM   1. GAD (generalized anxiety disorder)  F41.1      Plan: Patient not signing tx plan on computer screen due to COVID-19.  Treatment Goals:  Treatment goals remain on tx plan as patient works on strategies to meet her goals. Progress is noted each session in the "Progress" section on tx Plan.  Long term goal: Reduce thoughts that relate to her impulsive behaviors or desire to control things out of my control, and be able to make appropriate cognitive and behavioral changes.   Short term goal: Use cognitive methods to control trigger thoughts and reduce impulsive reactions to those trigger  thoughts.  Strategy: Help the patient uncover dysfunctional thoughts that lead to impulsivity. Replace each thought with a thought that is accurate, positive, self-enhancing, and adaptive.   Progress: Patient in today reporting anxiety and some increase in depression.  Is trying to cope with her younger son having just left for college and other son just had knee surgery.  Concerned about relationship with her children and them growing up and "not needing me as much. "I think I have some control issues because when I can't control everything, it bothers me and I get anxious even more."  "A little less anxious re: pandemic."  Worked with long term goal above of noticing thoughts that trigger anxiety and depression (especially around "control" issues), while working to recognize better "what is out of my control and focus my energy on what I can control."  Rates herself today as a "5" on anxiety scale of 1-10. Encouraged to continue interrupting anxious/controlling thoughts an replace them with more encouraging, reality-based thoughts that support her behavior change as well.  Goal review and progress noted with patient.  Next appt within 3 weeks.   Mathis Fare, LCSW

## 2020-01-01 ENCOUNTER — Other Ambulatory Visit: Payer: Self-pay | Admitting: Physician Assistant

## 2020-01-04 ENCOUNTER — Ambulatory Visit (INDEPENDENT_AMBULATORY_CARE_PROVIDER_SITE_OTHER): Payer: 59 | Admitting: Psychiatry

## 2020-01-04 ENCOUNTER — Other Ambulatory Visit: Payer: Self-pay

## 2020-01-04 DIAGNOSIS — F411 Generalized anxiety disorder: Secondary | ICD-10-CM | POA: Diagnosis not present

## 2020-01-04 NOTE — Progress Notes (Signed)
      [facility] Counselor/Therapist Progress Note  Patient ID: Shannon Gould, MRN: [id number],    Date: 01/04/2020  Time Spent: 45 minutes  1:15pm to 2:00pm  Treatment Type: Individual Therapy  Reported Symptoms: anxiety, some depression (not specific but generalized)  Mental Status Exam:  Appearance:   Neat     Behavior:  Appropriate, Sharing and Motivated  Motor:  Normal  Speech/Language:   Clear and Coherent  Affect:  anxious, some depression  Mood:  anxious and depressed  Thought process:  normal  Thought content:    WNL  Sensory/Perceptual disturbances:    WNL  Orientation:  oriented to person, place, time/date, situation, day of week, month of year and year  Attention:  Good  Concentration:  Good and Fair  Memory:  WNL  Fund of knowledge:   Good  Insight:    Good  Judgment:   Good  Impulse Control:  Good   Risk Assessment: Danger to Self:  No Self-injurious Behavior: No Danger to Others: No Duty to Warn:no Physical Aggression / Violence:No  Access to Firearms a concern: No  Gang Involvement:No   Subjective: Patient reporting anxiety and some depression today but also states she is feeling some better.  Has not had any "anxiety attacks" since last appt.   Interventions: Cognitive Behavioral Therapy and Solution-Oriented/Positive Psychology  Diagnosis:   ICD-10-CM   1. GAD (generalized anxiety disorder)  F41.1     Plan: Patient not signing tx plan on computer screen due to COVID-19.  Treatment Goals:  Treatment goals remain on tx plan as patient works on strategies to meet her goals. Progress is noted each session in the "Progress" section on tx Plan.  Long term goal: Reduce thoughts that relate to her impulsive behaviors or desire to control things out of my control, and be able to make appropriate cognitive and behavioral changes.   Short term goal: Use cognitive methods to control trigger thoughts and reduce impulsive reactions to those  trigger thoughts.  Strategy: Help the patient uncover dysfunctional thoughts that lead to impulsivity. Replace each thought with a thought that is accurate, positive, self-enhancing, and adaptive.   Progress: Patient reports anxiety and depression and shares that she has felt some better lately, as she's not had any "anxiety attacks" since last appts. Is concerned for her son who left for his freshman year in college and discussed that with her in more detail. Reviewed some of her goal plan and noted positively the progress she has made in reducing earlier thoughts that used to result in impulsive behaviors.  She continues to work on part of that long-term goals but was good to reflect today on some progress noted.  She is working to further reduce any impulsive behaviors and has noticed feeling less anxious since her last appointment.  This is encouraging for her and she has also noticed reduction in anxiety and depression although still struggles some with both symptoms.  We discussed certain trigger thoughts and she is trying to be more mindful of them in order to intercept them rather than allowing the triggers to result in thoughts or behaviors she is working on eliminating.  To continue positive self-care including emotional and physical, and also setting healthy boundaries with others as needed.  Goal review and progress noted with patient.  Next appt within 3 weeks.   Mathis Fare, LCSW

## 2020-01-04 NOTE — Telephone Encounter (Signed)
Pt requesting refill for Abilify 20mg  1/d. [facility]

## 2020-01-05 MED FILL — ARIPiprazole 20 MG TABS: 20 | 90 days supply | Qty: 90 | Fill #0

## 2020-01-12 MED FILL — AIMOVIG 70 MG/ML SOAJ: 70 | 30 days supply | Qty: 1 | Fill #1

## 2020-01-18 ENCOUNTER — Ambulatory Visit (INDEPENDENT_AMBULATORY_CARE_PROVIDER_SITE_OTHER): Payer: 59 | Admitting: Psychiatry

## 2020-01-18 ENCOUNTER — Other Ambulatory Visit: Payer: Self-pay

## 2020-01-18 DIAGNOSIS — F411 Generalized anxiety disorder: Secondary | ICD-10-CM

## 2020-01-18 NOTE — Progress Notes (Signed)
[facility] Counselor/Therapist Progress Note  Patient ID: Shannon Gould, MRN: [id number],    Date: 01/18/2020  Time Spent: 60 minutes  11:00am to 12:00noon  Treatment Type: Individual Therapy  Reported Symptoms: anxiety, some depression  Mental Status Exam:  Appearance:   Neat     Behavior:  Appropriate, Sharing and Motivated  Motor:  Normal  Speech/Language:   Normal Rate  Affect:  anxiety  Mood:  anxious  Thought process:  goal directed  Thought content:    WNL and some obsessiveness  Sensory/Perceptual disturbances:    WNL  Orientation:  oriented to person, place, time/date, situation, day of week, month of year and year  Attention:  Good  Concentration:  Good and Fair  Memory:  WNL  Fund of knowledge:   Good  Insight:    Good  Judgment:   Good  Impulse Control:  Good   Risk Assessment: Danger to Self:  No Self-injurious Behavior: No Danger to Others: No Duty to Warn:no Physical Aggression / Violence:No  Access to Firearms a concern: No  Gang Involvement:No   Subjective: Patient today reports anxiety "with some depression."  "I've gotten some better. Depression has decreased and am testing some of my irrational fears with success."  Interventions: Cognitive Behavioral Therapy, Solution-Oriented/Positive Psychology and Ego-Supportive  Diagnosis:   ICD-10-CM   1. GAD (generalized anxiety disorder)  F41.1     Plan: Patient not signing tx plan on computer screen due to COVID-19.  Treatment Goals:  Treatment goals remain on tx plan as patient works on strategies to meet her goals. Progress is noted each session in the "Progress" section on tx Plan.  Long term goal: Reduce thoughtsthat relate to her impulsive behaviors or desire to control things out of my control, and be able to make appropriate cognitive and behavioral changes.  Short term goal: Use cognitive methods to control trigger thoughts and reduce impulsive reactions to those  trigger thoughts.  Strategy: Help the patient uncover dysfunctional thoughts that lead to impulsivity. Replace each thought with a thought that is accurate, positive, self-enhancing, and adaptive.   Progress: Patient is today stating her depression has decreased some and anxiety still present. Did confront some of her irrational fears re: elevators while away with family recently and felt she has made some gains with that. Reports having some residual anxiety from confronting her fears "but I'm glad I did it."  Difficulty in relationship where other person is not respecting her boundaries and worked on this in session today, especially in her communication with the other person "because I feel like I might be seen as being mean."  Discussed this with patient and she seemed to have a clearer understanding that respecting and voicing her own boundaries does not define her as being mean, and that boundaries are healthy and relationships.  Is still  adjusing emotionally to having 2 sons in college, and her daughter is still at the home.  Reviewed her goal plan again today as she is really working more consistently on each of her goals and it is showing more recently.  Encouraged self-care including contact with others who are healthy for her, setting appropriate limits with people as needed, physical exercise, healthy nutrition, time with her family, working on emphasizing the positives versus the negatives and continued work on making her self talk more positive.  Goal review and progress/challenges reviewed with patient.  Next appointment within 2 to 3 weeks.   Mathis Fare, LCSW

## 2020-01-27 ENCOUNTER — Other Ambulatory Visit: Payer: Self-pay | Admitting: Physician Assistant

## 2020-01-28 MED FILL — VENLAFAXINE HCL ER 75 MG CA: 75 | 30 days supply | Qty: 30 | Fill #0

## 2020-02-01 ENCOUNTER — Encounter: Payer: Self-pay | Admitting: Physician Assistant

## 2020-02-01 ENCOUNTER — Other Ambulatory Visit: Payer: Self-pay

## 2020-02-01 ENCOUNTER — Other Ambulatory Visit (HOSPITAL_COMMUNITY): Payer: Self-pay | Admitting: Family Medicine

## 2020-02-01 ENCOUNTER — Ambulatory Visit (HOSPITAL_COMMUNITY)
Admission: RE | Admit: 2020-02-01 | Discharge: 2020-02-01 | Disposition: A | Discharge status: Home / Self Care | Payer: 59 | Source: Ambulatory Visit | Attending: Family Medicine | Admitting: Family Medicine

## 2020-02-01 ENCOUNTER — Ambulatory Visit (INDEPENDENT_AMBULATORY_CARE_PROVIDER_SITE_OTHER): Payer: 59 | Admitting: Physician Assistant

## 2020-02-01 DIAGNOSIS — F514 Sleep terrors [night terrors]: Secondary | ICD-10-CM | POA: Diagnosis not present

## 2020-02-01 DIAGNOSIS — M19071 Primary osteoarthritis, right ankle and foot: Secondary | ICD-10-CM | POA: Diagnosis not present

## 2020-02-01 DIAGNOSIS — F410 Panic disorder [episodic paroxysmal anxiety] without agoraphobia: Secondary | ICD-10-CM | POA: Diagnosis not present

## 2020-02-01 DIAGNOSIS — E6609 Other obesity due to excess calories: Secondary | ICD-10-CM | POA: Diagnosis not present

## 2020-02-01 DIAGNOSIS — M79674 Pain in right toe(s): Secondary | ICD-10-CM | POA: Insufficient documentation

## 2020-02-01 DIAGNOSIS — Z6835 Body mass index (BMI) 35.0-35.9, adult: Secondary | ICD-10-CM | POA: Diagnosis not present

## 2020-02-01 DIAGNOSIS — F3160 Bipolar disorder, current episode mixed, unspecified: Secondary | ICD-10-CM | POA: Diagnosis not present

## 2020-02-01 DIAGNOSIS — F3341 Major depressive disorder, recurrent, in partial remission: Secondary | ICD-10-CM | POA: Diagnosis not present

## 2020-02-01 DIAGNOSIS — Z79899 Other long term (current) drug therapy: Secondary | ICD-10-CM | POA: Diagnosis not present

## 2020-02-01 MED ORDER — MIRTAZAPINE 7.5 MG PO TABS: 7.5000 mg | ORAL_TABLET | Freq: Every evening | ORAL | 1 refills | Status: DC | PRN

## 2020-02-01 MED ORDER — ALPRAZOLAM 1 MG PO TABS: ORAL_TABLET | ORAL | 2 refills | Status: DC

## 2020-02-01 MED ORDER — B COMPLEX PO TABS: 1.0000 | ORAL_TABLET | Freq: Every day | ORAL | 11 refills | Status: AC

## 2020-02-01 MED ORDER — VENLAFAXINE HCL ER 75 MG PO CP24: 75.0000 mg | ORAL_CAPSULE | Freq: Every day | ORAL | 0 refills | Status: DC

## 2020-02-01 MED ORDER — VENLAFAXINE HCL ER 150 MG PO CP24: 150.0000 mg | ORAL_CAPSULE | Freq: Every day | ORAL | 0 refills | Status: DC

## 2020-02-01 MED FILL — INDOMETHACIN 50 MG CAPSULE: 50 | 10 days supply | Qty: 30 | Fill #0

## 2020-02-01 MED FILL — MIRTAZAPINE 7.5 MG TABLET: 7.5 | 90 days supply | Qty: 90 | Fill #0

## 2020-02-01 MED FILL — VENLAFAXINE HCL ER 150 MG C: 150 | 90 days supply | Qty: 90 | Fill #0

## 2020-02-01 MED FILL — ALPRAZolam 1 MG TABS: 1 | 30 days supply | Qty: 60 | Fill #0

## 2020-02-01 NOTE — Progress Notes (Signed)
[facility] Med Check  Patient ID: Shannon Gould,  MRN: [id number]  PCP: Assunta Found, MD  Date of Evaluation: 02/01/2020 Time spent:40 minutes  Chief Complaint:  Chief Complaint    Anxiety; Depression; Insomnia      HISTORY/CURRENT STATUS: HPI For routine med check.  Doing really well as far as her mental health goes. Had a severe PA last week at work. Was sent home for a few hours and then went back to work. Hasn't had one that severe in awhile.  She does have small ones a few times a month.  Sometimes she has to take Xanax and sometimes she does not.  It just depends on how severe it is or how long it lasts.  Xanax is effective.  The panic attacks really improved after we increased the Effexor last time.  She has had intermittent FMLA coverage in the past and if that is possible at this time.  She wants to make sure if the panic attacks worsen and she has to stay home or be sent home, that there will be no problems with her job.  She is usually able to enjoy things.  States she has "a little bit of depression" but not much.  She does not feel like the Effexor needs to be increased because of that.  Sometimes she does have decreased energy and motivation but it is not bad.  Appetite has not changed.  No extreme sadness, tearfulness, or feelings of hopelessness.  Sometimes she will forget things, like what she went into a room for or something but it is not severe.  Denies suicidal or homicidal thoughts.  Patient denies increased energy with decreased need for sleep, no increased talkativeness, no racing thoughts, no impulsivity or risky behaviors, no increased spending, no increased libido, no grandiosity, no irritability, no AH/VH, no paranoia.   Night terrors are much better.  She sleeps a lot better with the mirtazapine and when she does take it she does not have nightmares at all.  She does not take it every night and does not take it with the Ambien which is even more  infrequent.  Denies dizziness, syncope, seizures, numbness, tingling, tremor, tics, unsteady gait, slurred speech, confusion. Denies muscle or joint pain, stiffness, or dystonia.  Individual Medical History/ Review of Systems: Changes? :Yes She has hurt her foot somehow, states it is really painful.  She will see her PCP hopefully this afternoon for that.  Past medications for mental health diagnoses include: Effexor, Vistaril, Xanax, Ambien, Zoloft, Wellbutrin which caused increased agitation and suicidal ideations, trazodone, Abilify, Sonata  Allergies: Hydrocodone, Wellbutrin [bupropion], Caffeine, and Lactose intolerance (gi)  Current Medications:  Current Outpatient Medications:  .  albuterol (PROVENTIL HFA) 108 (90 BASE) MCG/ACT inhaler, Inhale 2 puffs into the lungs every 6 (six) hours as needed. For shortness of breath , Disp: , Rfl:  .  ALPRAZolam (XANAX) 1 MG tablet, TAKE 1/2 TO 1 TABLET BY MOUTH TWICE A DAY AS NEEDED, Disp: 60 tablet, Rfl: 2 .  ARIPiprazole (ABILIFY) 20 MG tablet, TAKE 1 TABLET (20 MG TOTAL) BY MOUTH DAILY., Disp: 90 tablet, Rfl: 0 .  eletriptan (RELPAX) 40 MG tablet, Take 1 tablet for migraine.  May repeat in 2 hours if headache persists or recurs.  Max 2 tablets in 24hrs (Patient taking differently: Take 40 mg by mouth every 2 (two) hours as needed. Take 1 tablet for migraine.  May repeat in 2 hours if headache persists or recurs.  Max 2 tablets  in 24hrs), Disp: 10 tablet, Rfl: 0 .  Erenumab-aooe (AIMOVIG) 70 MG/ML SOAJ, Inject 70 mg into the skin every 30 (thirty) days., Disp: 1 pen, Rfl: 11 .  furosemide (LASIX) 40 MG tablet, Take 40 mg by mouth daily as needed. , Disp: , Rfl:  .  ibuprofen (ADVIL) 800 MG tablet, Take 1 tablet (800 mg total) by mouth every 8 (eight) hours as needed., Disp: 30 tablet, Rfl: 0 .  levonorgestrel (MIRENA, 52 MG,) 20 MCG/24HR IUD, 1 each by Intrauterine route once. , Disp: , Rfl:  .  mirtazapine (REMERON) 7.5 MG tablet, Take 1 tablet  (7.5 mg total) by mouth at bedtime as needed., Disp: 90 tablet, Rfl: 1 .  TROKENDI XR 200 MG CP24, TAKE 1 CAPSULE BY MOUTH AT BEDTIME. (Patient taking differently: Take 1 capsule by mouth daily. ), Disp: 90 capsule, Rfl: 3 .  venlafaxine XR (EFFEXOR-XR) 150 MG 24 hr capsule, Take 1 capsule (150 mg total) by mouth daily with breakfast., Disp: 90 capsule, Rfl: 0 .  venlafaxine XR (EFFEXOR-XR) 75 MG 24 hr capsule, Take 1 capsule (75 mg total) by mouth daily with breakfast. Take with 150 mg= 225 mg., Disp: 90 capsule, Rfl: 0 .  zolpidem (AMBIEN) 10 MG tablet, Take 10 mg by mouth at bedtime as needed for sleep., Disp: , Rfl:  .  b complex vitamins tablet, Take 1 tablet by mouth daily., Disp: 30 tablet, Rfl: 11 .  montelukast (SINGULAIR) 10 MG tablet, Take 10 mg by mouth daily as needed (shortness of breath).  (Patient not taking: Reported on 02/01/2020), Disp: , Rfl: 11 .  oxybutynin (DITROPAN-XL) 10 MG 24 hr tablet, , Disp: , Rfl:  .  zolpidem (AMBIEN) 10 MG tablet, TAKE 1 TABLET (10 MG TOTAL) BY MOUTH AT BEDTIME AS NEEDED FOR SLEEP., Disp: 30 tablet, Rfl: 0 Medication Side Effects: none  Family Medical/ Social History: Changes? No  MENTAL HEALTH EXAM:  There were no vitals taken for this visit.There is no height or weight on file to calculate BMI.  General Appearance: Casual, Neat and Well Groomed  Eye Contact:  Good  Speech:  Clear and Coherent  Volume:  Normal  Mood:  Euthymic  Affect:  Appropriate  Thought Process:  Goal Directed and Descriptions of Associations: Intact  Orientation:  Full (Time, Place, and Person)  Thought Content: Logical   Suicidal Thoughts:  No  Homicidal Thoughts:  No  Memory:  WNL  Judgement:  Good  Insight:  Good  Psychomotor Activity:  She is walking a little slowly due to right foot pain  Concentration:  Concentration: Good  Recall:  Good  Fund of Knowledge: Good  Language: Good  Assets:  Desire for Improvement  ADL's:  Intact  Cognition: WNL  Prognosis:   Good    DIAGNOSES:    ICD-10-CM   1. Bipolar I disorder, most recent episode mixed (HCC)  F31.60   2. Encounter for long-term (current) use of medications  Z79.899 Basic metabolic panel    Hemoglobin A1c    Lipid panel  3. Panic disorder  F41.0   4. Night terrors  F51.4   5. Recurrent major depressive disorder, in partial remission (HCC)  F33.41     Receiving Psychotherapy: Yes With Rockne Menghini, LCSW   RECOMMENDATIONS:  PDMP reviewed.  I provided 40 minutes of face-to-face time during this encounter. I am glad to see that the anxiety has improved for the most part.  I agree that she needs to have FMLA to  make sure that any absences are covered when she does have a panic attack.   I recommend that she take mirtazapine every night, not just as needed since it is helping with the night terrors. Recommend B complex over-the-counter which can help overall nervous system health, and may help prevent and treat the occasional memory issues. She has not had labs checking glucose and lipids in a little over a year so that needs to be done. Start B complex daily. Continue Mirtazapine 7.5 mg, 1 p.o. nightly.  I recommend she take it every night to help sleep and prevent night terrors. Continue Effexor XR 150 mg +75 mg daily.  If needed to increase, we can do that over the phone before her next visit. Continue Xanax 1 mg, 1/2-1 twice daily as needed. Continue Abilify 20 mg daily. Continue Ambien 10 mg 1 nightly as needed sleep. Continue therapy with Rockne Menghini, LCSW. Labs ordered.   Return in 3 months.  Melony Overly, PA-C

## 2020-02-03 ENCOUNTER — Telehealth: Payer: Self-pay | Admitting: Physician Assistant

## 2020-02-03 ENCOUNTER — Encounter: Payer: Self-pay | Admitting: Physician Assistant

## 2020-02-03 ENCOUNTER — Encounter: Payer: Self-pay | Admitting: Orthopaedic Surgery

## 2020-02-03 NOTE — Telephone Encounter (Signed)
See phone note

## 2020-02-03 NOTE — Telephone Encounter (Signed)
Reviewed

## 2020-02-03 NOTE — Telephone Encounter (Signed)
Pt. Made aware and verbalized understanding.

## 2020-02-10 MED FILL — AIMOVIG 70 MG/ML SOAJ: 70 | 30 days supply | Qty: 1 | Fill #2

## 2020-02-12 ENCOUNTER — Ambulatory Visit: Payer: 59 | Admitting: Psychiatry

## 2020-02-23 ENCOUNTER — Ambulatory Visit (INDEPENDENT_AMBULATORY_CARE_PROVIDER_SITE_OTHER): Payer: 59 | Admitting: Psychiatry

## 2020-02-23 DIAGNOSIS — F411 Generalized anxiety disorder: Secondary | ICD-10-CM

## 2020-02-23 NOTE — Progress Notes (Signed)
[facility] Counselor/Therapist Progress Note  Patient ID: Shannon Gould, MRN: [id number],    Date: 02/23/2020  Time Spent: 60 minutes   5:00pm to 6:00pm  Virtual Visit Note via Copywriter, advertising with patient by a video enabled telemedicine/telehealth application or telephone, with their informed consent, and verified patient privacy and that I am speaking with the correct person using two identifiers. I discussed the limitations, risks, security and privacy concerns of performing psychotherapy and management service by telephone and the availability of in person appointments. I also discussed with the patient that there may be a patient responsible charge related to this service. The patient expressed understanding and agreed to proceed. I discussed the treatment planning with the patient. The patient was provided an opportunity to ask questions and all were answered. The patient agreed with the plan and demonstrated an understanding of the instructions. The patient was advised to call  our office if  symptoms worsen or feel they are in a crisis state and need immediate contact.   Therapist Location: [facility] Patient Location: home    Treatment Type: Individual Therapy  Reported Symptoms: anxiety, depressed, worthless (at work)   Mental Status Exam:  Appearance:   Neat     Behavior:  Appropriate, Sharing and Motivated  Motor:  Normal  Speech/Language:   Clear and Coherent  Affect:  anxiety, depression  Mood:  anxious and depressed  Thought process:  goal directed  Thought content:    WNL  Sensory/Perceptual disturbances:    WNL  Orientation:  oriented to person, place, time/date, situation, day of week, month of year and year  Attention:  Good  Concentration:  Fair  Memory:  none  Fund of knowledge:   Good  Insight:    Good and Fair  Judgment:   Fair  Impulse Control:  Good   Risk Assessment: Danger to Self:  No Self-injurious Behavior:  No Danger to Others: No Duty to Warn:no Physical Aggression / Violence:No  Access to Firearms a concern: No  Gang Involvement:No   Subjective: Patient today motivated but struggling with anxiety, depression, and work tasks.  Interventions: Cognitive Behavioral Therapy and Solution-Oriented/Positive Psychology  Diagnosis:   ICD-10-CM   1. GAD (generalized anxiety disorder)  F41.1     Plan: Patient not signing tx plan on computer screen due to COVID-19.  Treatment Goals:  Treatment goals remain on tx plan as patient works on strategies to meet her goals. Progress is noted each session in the "Progress" section on tx Plan.  Long term goal: Reduce thoughtsthat relate to her impulsive behaviors or desire to control things out of my control, and be able to make appropriate cognitive and behavioral changes.  Short term goal: Use cognitive methods to control trigger thoughts and reduce impulsive reactions to those trigger thoughts.  Strategy: Help the patient uncover dysfunctional thoughts that lead to impulsivity. Replace each thought with a thought that is accurate, positive, self-enhancing, and adaptive.   Progress: Patient today reporting anxiety, depression, and feeling "worthless" at work due to it being so busy.  Her supervisor at work has told her that she is not "worthless" but rather they are just extremely busy right now and she is okay with her job.  Does a lot of self-doubting and we processed this a lot today as patient states she has struggled a lot with it. Procesed some of her anxiety and depressive thoughts about herself and her daughter who also reports some anxiety and is  in counseling.   Patient second-guessing herself a lot "as a parent" of 3 (ages 70, 39, 72).  Patient stating she still needs to work on more positive self-talk, some occasional impulse control issues "but I think it's mostly anxiety", and improving her self-confidence. She was able to share  some specific examples in session today that we worked with and she is to continue between sessions.  Patient is doing better with some of her boundary setting as we worked on last session.  Good self-care encouraged and to include supportive contacts with friends and family, physical exercise, looking for more positives than negatives, good sleep practices, healthy nutrition, family time, and working to make herself talk more positive and improving her self-confidence.  Goal review and progress/challenges noted with patient.  Next appointment within 2 to 3 weeks.   Mathis Fare, LCSW

## 2020-02-25 ENCOUNTER — Other Ambulatory Visit: Payer: Self-pay

## 2020-02-25 ENCOUNTER — Ambulatory Visit (INDEPENDENT_AMBULATORY_CARE_PROVIDER_SITE_OTHER): Payer: 59 | Admitting: Orthopaedic Surgery

## 2020-02-25 ENCOUNTER — Encounter: Payer: Self-pay | Admitting: Orthopaedic Surgery

## 2020-02-25 ENCOUNTER — Ambulatory Visit: Payer: 59

## 2020-02-25 VITALS — BP 143/85 | HR 81 | Ht 63.0 in | Wt 211.0 lb

## 2020-02-25 DIAGNOSIS — M79671 Pain in right foot: Secondary | ICD-10-CM

## 2020-02-25 NOTE — Progress Notes (Signed)
Subjective:    Patient ID: Shannon Gould, female    DOB: May 14, 1976, 44 y.o.   MRN: [id number]  HPI She has pain in the right foot.  She went on a long hike about three months ago.  A few days later she had some right foot pain.  It got better then it got worse after about two weeks.  She had pain walking on the foot.  She had no redness.  She went to [facility] and was seen. X-rays were done 02-01-2020 showing some changes of the second metatarsal.  Her pain got better then worse. She is here as her foot is still hurting at times, more after walking a while.  She has no redness, no numbness.  I have reviewed the notes from [place].  I have independently reviewed and interpreted x-rays of this patient done at another site by another physician or qualified health professional.     Review of Systems  Constitutional: Positive for activity change.  Respiratory: Positive for shortness of breath.   Musculoskeletal: Positive for arthralgias and gait problem.  Psychiatric/Behavioral: The patient is nervous/anxious.   All other systems reviewed and are negative.  For Review of Systems, all other systems reviewed and are negative.  The following is a summary of the past history medically, past history surgically, known current medicines, social history and family history.  This information is gathered electronically by the computer from prior information and documentation.  I review this each visit and have found including this information at this point in the chart is beneficial and informative.   Past Medical History:  Diagnosis Date  . Anemia    hx of patient reports   . Anxiety   . Asthma   . Depression   . GERD (gastroesophageal reflux disease)   . Head ache     Past Surgical History:  Procedure Laterality Date  . CHOLECYSTECTOMY    . transvaginal mesh      Current Outpatient Medications on File Prior to Visit  Medication Sig Dispense Refill  . albuterol (PROVENTIL  HFA) 108 (90 BASE) MCG/ACT inhaler Inhale 2 puffs into the lungs every 6 (six) hours as needed. For shortness of breath     . ALPRAZolam (XANAX) 1 MG tablet TAKE 1/2 TO 1 TABLET BY MOUTH TWICE A DAY AS NEEDED 60 tablet 2  . ARIPiprazole (ABILIFY) 20 MG tablet TAKE 1 TABLET (20 MG TOTAL) BY MOUTH DAILY. 90 tablet 0  . b complex vitamins tablet Take 1 tablet by mouth daily. 30 tablet 11  . eletriptan (RELPAX) 40 MG tablet Take 1 tablet for migraine.  May repeat in 2 hours if headache persists or recurs.  Max 2 tablets in 24hrs (Patient taking differently: Take 40 mg by mouth every 2 (two) hours as needed. Take 1 tablet for migraine.  May repeat in 2 hours if headache persists or recurs.  Max 2 tablets in 24hrs) 10 tablet 0  . Erenumab-aooe (AIMOVIG) 70 MG/ML SOAJ Inject 70 mg into the skin every 30 (thirty) days. 1 pen 11  . furosemide (LASIX) 40 MG tablet Take 40 mg by mouth daily as needed.     Marland Kitchen levonorgestrel (MIRENA, 52 MG,) 20 MCG/24HR IUD 1 each by Intrauterine route once.     . mirtazapine (REMERON) 7.5 MG tablet Take 1 tablet (7.5 mg total) by mouth at bedtime as needed. 90 tablet 1  . TROKENDI XR 200 MG CP24 TAKE 1 CAPSULE BY MOUTH AT BEDTIME. (Patient taking  differently: Take 1 capsule by mouth daily. ) 90 capsule 3  . venlafaxine XR (EFFEXOR-XR) 150 MG 24 hr capsule Take 1 capsule (150 mg total) by mouth daily with breakfast. 90 capsule 0  . venlafaxine XR (EFFEXOR-XR) 75 MG 24 hr capsule Take 1 capsule (75 mg total) by mouth daily with breakfast. Take with 150 mg= 225 mg. 90 capsule 0  . zolpidem (AMBIEN) 10 MG tablet Take 10 mg by mouth at bedtime as needed for sleep.    Marland Kitchen ibuprofen (ADVIL) 800 MG tablet Take 1 tablet (800 mg total) by mouth every 8 (eight) hours as needed. (Patient not taking: Reported on 02/25/2020) 30 tablet 0  . montelukast (SINGULAIR) 10 MG tablet Take 10 mg by mouth daily as needed (shortness of breath).  (Patient not taking: Reported on 02/01/2020)  11  . oxybutynin  (DITROPAN-XL) 10 MG 24 hr tablet     . zolpidem (AMBIEN) 10 MG tablet TAKE 1 TABLET (10 MG TOTAL) BY MOUTH AT BEDTIME AS NEEDED FOR SLEEP. 30 tablet 0   No current facility-administered medications on file prior to visit.    Social History   Socioeconomic History  . Marital status: Single    Spouse name: Not on file  . Number of children: 3  . Years of education: Not on file  . Highest education level: Associate degree: occupational, Scientist, product/process development, or vocational program  Occupational History  . Occupation: LPN    Employer: [employer]  Tobacco Use  . Smoking status: Never Smoker  . Smokeless tobacco: Never Used  Vaping Use  . Vaping Use: Never used  Substance and Sexual Activity  . Alcohol use: Yes    Alcohol/week: 0.0 - 1.0 standard drinks    Comment: rare  . Drug use: No  . Sexual activity: Yes    Birth control/protection: I.U.D.  Other Topics Concern  . Not on file  Social History Narrative   Patient is left-handed. She lives in a one level home with 2 of her children, one is at college. She avoids caffeine. She does not exercise.   Social Determinants of Health   Financial Resource Strain:   . Difficulty of Paying Living Expenses: Not on file  Food Insecurity:   . Worried About Programme researcher, broadcasting/film/video in the Last Year: Not on file  . Ran Out of Food in the Last Year: Not on file  Transportation Needs:   . Lack of Transportation (Medical): Not on file  . Lack of Transportation (Non-Medical): Not on file  Physical Activity:   . Days of Exercise per Week: Not on file  . Minutes of Exercise per Session: Not on file  Stress:   . Feeling of Stress : Not on file  Social Connections:   . Frequency of Communication with Friends and Family: Not on file  . Frequency of Social Gatherings with Friends and Family: Not on file  . Attends Religious Services: Not on file  . Active Member of Clubs or Organizations: Not on file  . Attends Banker Meetings: Not on  file  . Marital Status: Not on file  Intimate Partner Violence:   . Fear of Current or Ex-Partner: Not on file  . Emotionally Abused: Not on file  . Physically Abused: Not on file  . Sexually Abused: Not on file    Family History  Problem Relation Age of Onset  . Hypertension Mother   . Diabetes Mother   . Heart disease Father   .  Sleep apnea Father   . Diabetes Father   . Heart attack Father        x2  . Heart disease Sister   . Anemia Sister   . Hypertension Sister   . Heart disease Brother   . Hypertension Brother   . Seizures Paternal Grandmother   . Anxiety disorder Daughter   . Depression Daughter   . Asthma Daughter   . Asthma Son   . Asthma Son     BP (!) 143/85   Pulse 81   Ht 5\' 3"  (1.6 m)   Wt 211 lb (95.7 kg)   BMI 37.38 kg/m   Body mass index is 37.38 kg/m.      Objective:   Physical Exam Vitals and nursing note reviewed. Exam conducted with a chaperone present.  Constitutional:      Appearance: She is well-developed.  HENT:     Head: Normocephalic and atraumatic.  Eyes:     Conjunctiva/sclera: Conjunctivae normal.     Pupils: Pupils are equal, round, and reactive to light.  Cardiovascular:     Rate and Rhythm: Normal rate and regular rhythm.  Pulmonary:     Effort: Pulmonary effort is normal.  Abdominal:     Palpations: Abdomen is soft.  Musculoskeletal:     Cervical back: Normal range of motion and neck supple.       Feet:  Skin:    General: Skin is warm and dry.  Neurological:     Mental Status: She is alert and oriented to person, place, and time.     Cranial Nerves: No cranial nerve deficit.     Motor: No abnormal muscle tone.     Coordination: Coordination normal.     Deep Tendon Reflexes: Reflexes are normal and symmetric. Reflexes normal.  Psychiatric:        Behavior: Behavior normal.        Thought Content: Thought content normal.        Judgment: Judgment normal.    X-rays were done of the right foot, reported  separately.       Assessment & Plan:   Encounter Diagnosis  Name Primary?  . Acute foot pain, right Yes   I am concerned about possible stress fracture.  She has some changes of the X-rays.    I will see her in one month. She may need new X-rays then.  Electronically Signed , MD 9/23/202112:26 PM

## 2020-02-26 ENCOUNTER — Ambulatory Visit: Payer: 59 | Admitting: Psychiatry

## 2020-03-01 MED FILL — VENLAFAXINE HCL ER 75 MG CA: 75 | 90 days supply | Qty: 90 | Fill #0

## 2020-03-07 NOTE — Progress Notes (Signed)
NEUROLOGY FOLLOW UP OFFICE NOTE  Shannon Gould [id number]  HISTORY OF PRESENT ILLNESS: Shannon Gould is a 32 year oldleft-handed female withgeneralized anxiety disorderand majordepressionwhofollows up for migraines.  UPDATE: Headaches were averaging 1 time a week.  However, they became daily for the past 2 weeks because she stopped Trokendi XR because it made her foggy-headed.  Relpax is still helpful, lasting 2 hours.  Current NSAIDS:Ibuprofen 600mg  Current analgesics:none Current triptans:Relpax 40 mg Current ergotamine:None Current anti-emetic:none Current muscle relaxants:None Current anti-anxiolytic:Xanax Current sleep aide:Ambien Current Antihypertensive medications:None Current Antidepressant medications:Venlafaxine XL 225 mg daily, Abilify Current Anticonvulsant medications: none Current anti-CGRP:Aimovig 70mg  Current Vitamins/Herbal/Supplements:Vitamin D Current Antihistamines/Decongestants:None Other therapy:None Hormone/birth control:Mirena  Caffeine:no Alcohol:occasional Smoker:no Diet:hydrates Exercise:no Depression:yes; Anxiety:Yes. She sees a for therapy. Other pain:Suboccipital pain Sleep hygiene:Insomnia. Uses sonata and weighted blanket.  HISTORY: Onset: 11s Location:Usually left sided Quality:pounding InitialIntensity:6/10.[name] new headache, thunderclap headache or severe headache that wakes herfrom sleep. Aura:no Prodrome:no Postdrome:no Associated symptoms: Nausea,photophobia, phonophobia, blurred vision, left eye droops, osmophobia.[name] associated vomiting orunilateral numbness or weakness. InitialDuration:1 day InitialFrequency:Once a month InitialFrequency of abortive medication:once or twice a month Triggers: Caffeine Relieving factors: Laying down Activity:Aggravates She alsoreportednear daily dull bifrontal headaches which she  treats with ibuprofen 600mg .  CT of head from 01/25/2015 was personally reviewed and revealed no acute intracranial abnormalities.  Past NSAIDS:Ibuprofen, naproxen Past analgesics:Tylenol, Excedrin, Ultracet Past abortive triptans:Maxalt 5 mg, Sumatriptan tablet (caused nausea) Past abortive ergotamine:none Past muscle relaxants:none Past anti-emetic:none Past antihypertensive medications:none Past antidepressant medications:Wellbutrin, Celexa, Zoloft 100 mg Past anticonvulsant medications:Topiramate ER (cognitive problems) Past anti-CGRP:none Past vitamins/Herbal/Supplements:none Past antihistamines/decongestants:none Other past therapies:none  PAST MEDICAL HISTORY: Past Medical History:  Diagnosis Date  . Anemia    hx of patient reports   . Anxiety   . Asthma   . Depression   . GERD (gastroesophageal reflux disease)   . Head ache     MEDICATIONS: Current Outpatient Medications on File Prior to Visit  Medication Sig Dispense Refill  . albuterol (PROVENTIL HFA) 108 (90 BASE) MCG/ACT inhaler Inhale 2 puffs into the lungs every 6 (six) hours as needed. For shortness of breath     . ALPRAZolam (XANAX) 1 MG tablet TAKE 1/2 TO 1 TABLET BY MOUTH TWICE A DAY AS NEEDED 60 tablet 2  . ARIPiprazole (ABILIFY) 20 MG tablet TAKE 1 TABLET (20 MG TOTAL) BY MOUTH DAILY. 90 tablet 0  . b complex vitamins tablet Take 1 tablet by mouth daily. 30 tablet 11  . eletriptan (RELPAX) 40 MG tablet Take 1 tablet for migraine.  May repeat in 2 hours if headache persists or recurs.  Max 2 tablets in 24hrs (Patient taking differently: Take 40 mg by mouth every 2 (two) hours as needed. Take 1 tablet for migraine.  May repeat in 2 hours if headache persists or recurs.  Max 2 tablets in 24hrs) 10 tablet 0  . Erenumab-aooe (AIMOVIG) 70 MG/ML SOAJ Inject 70 mg into the skin every 30 (thirty) days. 1 pen 11  . furosemide (LASIX) 40 MG tablet Take 40 mg by mouth daily as needed.     38s  ibuprofen (ADVIL) 800 MG tablet Take 1 tablet (800 mg total) by mouth every 8 (eight) hours as needed. (Patient not taking: Reported on 02/25/2020) 30 tablet 0  . levonorgestrel (MIRENA, 52 MG,) 20 MCG/24HR IUD 1 each by Intrauterine route once.     . mirtazapine (REMERON) 7.5 MG tablet Take 1 tablet (7.5 mg total) by mouth at bedtime as needed. 90  tablet 1  . montelukast (SINGULAIR) 10 MG tablet Take 10 mg by mouth daily as needed (shortness of breath).  (Patient not taking: Reported on 02/01/2020)  11  . oxybutynin (DITROPAN-XL) 10 MG 24 hr tablet     . TROKENDI XR 200 MG CP24 TAKE 1 CAPSULE BY MOUTH AT BEDTIME. (Patient taking differently: Take 1 capsule by mouth daily. ) 90 capsule 3  . venlafaxine XR (EFFEXOR-XR) 150 MG 24 hr capsule Take 1 capsule (150 mg total) by mouth daily with breakfast. 90 capsule 0  . venlafaxine XR (EFFEXOR-XR) 75 MG 24 hr capsule Take 1 capsule (75 mg total) by mouth daily with breakfast. Take with 150 mg= 225 mg. 90 capsule 0  . zolpidem (AMBIEN) 10 MG tablet Take 10 mg by mouth at bedtime as needed for sleep.    Marland Kitchen zolpidem (AMBIEN) 10 MG tablet TAKE 1 TABLET (10 MG TOTAL) BY MOUTH AT BEDTIME AS NEEDED FOR SLEEP. 30 tablet 0   No current facility-administered medications on file prior to visit.    ALLERGIES: Allergies  Allergen Reactions  . Hydrocodone Itching  . Wellbutrin [Bupropion]   . Caffeine Other (See Comments)    Headache   . Lactose Intolerance (Gi) Other (See Comments)    gas    FAMILY HISTORY: Family History  Problem Relation Age of Onset  . Hypertension Mother   . Diabetes Mother   . Heart disease Father   . Sleep apnea Father   . Diabetes Father   . Heart attack Father        x2  . Heart disease Sister   . Anemia Sister   . Hypertension Sister   . Heart disease Brother   . Hypertension Brother   . Seizures Paternal Grandmother   . Anxiety disorder Daughter   . Depression Daughter   . Asthma Daughter   . Asthma Son   . Asthma  Son     SOCIAL HISTORY: Social History   Socioeconomic History  . Marital status: Single    Spouse name: Not on file  . Number of children: 3  . Years of education: Not on file  . Highest education level: Associate degree: occupational, Scientist, product/process development, or vocational program  Occupational History  . Occupation: LPN    Employer: [employer]  Tobacco Use  . Smoking status: Never Smoker  . Smokeless tobacco: Never Used  Vaping Use  . Vaping Use: Never used  Substance and Sexual Activity  . Alcohol use: Yes    Alcohol/week: 0.0 - 1.0 standard drinks    Comment: rare  . Drug use: No  . Sexual activity: Yes    Birth control/protection: I.U.D.  Other Topics Concern  . Not on file  Social History Narrative   Patient is left-handed. She lives in a one level home with 2 of her children, one is at college. She avoids caffeine. She does not exercise.   Social Determinants of Health   Financial Resource Strain:   . Difficulty of Paying Living Expenses: Not on file  Food Insecurity:   . Worried About Programme researcher, broadcasting/film/video in the Last Year: Not on file  . Ran Out of Food in the Last Year: Not on file  Transportation Needs:   . Lack of Transportation (Medical): Not on file  . Lack of Transportation (Non-Medical): Not on file  Physical Activity:   . Days of Exercise per Week: Not on file  . Minutes of Exercise per Session: Not on file  Stress:   . Feeling of Stress : Not on file  Social Connections:   . Frequency of Communication with Friends and Family: Not on file  . Frequency of Social Gatherings with Friends and Family: Not on file  . Attends Religious Services: Not on file  . Active Member of Clubs or Organizations: Not on file  . Attends Banker Meetings: Not on file  . Marital Status: Not on file  Intimate Partner Violence:   . Fear of Current or Ex-Partner: Not on file  . Emotionally Abused: Not on file  . Physically Abused: Not on file  . Sexually  Abused: Not on file    PHYSICAL EXAM: Blood pressure 136/82, pulse 89, resp. rate 18, height 5\' 3"  (1.6 m), weight 223 lb (101.2 kg), SpO2 98 %. General: No acute distress.  Patient appears well-groomed.   Head:  Normocephalic/atraumatic Eyes:  Fundi examined but not visualized Neck: supple, no paraspinal tenderness, full range of motion Heart:  Regular rate and rhythm Lungs:  Clear to auscultation bilaterally Back: No paraspinal tenderness Neurological Exam: alert and oriented to person, place, and time. Attention span and concentration intact, recent and remote memory intact, fund of knowledge intact.  Speech fluent and not dysarthric, language intact.  CN II-XII intact. Bulk and tone normal, muscle strength 5/5 throughout.  Sensation to light touch, temperature and vibration intact.  Deep tendon reflexes 2+ throughout, toes downgoing.  Finger to nose and heel to shin testing intact.  Gait normal, Romberg negative.  IMPRESSION: Migraine without aura, without status migrainosus, not intractable.  Increased frequency since discontinuing Trokendi  PLAN: 1.  Increase Aimovig to 140mg  every 28 days 2.  Relpax for abortive therapy. 3.  Limit use of pain relievers to no more than 2 days out of week to prevent risk of rebound or medication-overuse headache. 4.  Keep headache diary 5.  Follow up in 6 months.  , DO  CC: , MD

## 2020-03-08 ENCOUNTER — Encounter: Payer: Self-pay | Admitting: Neurology

## 2020-03-08 ENCOUNTER — Ambulatory Visit: Payer: 59 | Admitting: Neurology

## 2020-03-08 ENCOUNTER — Other Ambulatory Visit: Payer: Self-pay

## 2020-03-08 ENCOUNTER — Other Ambulatory Visit: Payer: Self-pay | Admitting: Neurology

## 2020-03-08 VITALS — BP 136/82 | HR 89 | Resp 18 | Ht 63.0 in | Wt 223.0 lb

## 2020-03-08 DIAGNOSIS — G43009 Migraine without aura, not intractable, without status migrainosus: Secondary | ICD-10-CM | POA: Diagnosis not present

## 2020-03-08 MED ORDER — ELETRIPTAN HYDROBROMIDE 40 MG PO TABS: ORAL_TABLET | ORAL | 5 refills | Status: DC

## 2020-03-08 MED ORDER — AIMOVIG 140 MG/ML ~~LOC~~ SOAJ: 140.0000 mg | SUBCUTANEOUS | 11 refills | Status: DC

## 2020-03-08 MED FILL — AIMOVIG 140 MG/ML SOAJ: 140 | 28 days supply | Qty: 1 | Fill #0

## 2020-03-08 NOTE — Patient Instructions (Signed)
1.  Increase Aimovig to 140mg  every 28 days 2.  Refilled eletriptan (Relpax) 3.  Limit use of pain relievers to no more than 2 days out of week to prevent risk of rebound or medication-overuse headache. 4.  Keep headache diary 5.  Follow up in 6 months.

## 2020-03-08 NOTE — Progress Notes (Addendum)
[name] (KeyCamille Bal) Rx #: [id number] Aimovig 140MG /ML auto-injectors   Form Med[employer] ePA Form 2017 NCPDP Created 24 minutes ago Sent to Plan 8 minutes ago Plan Response 7 minutes ago Submit Clinical Questions 4 minutes ago Determination Favorable 2 minutes ago Message from Plan The request has been approved. The authorization is effective for a maximum of 12 fills from 03/08/2020 to 03/07/2021, as long as the member is enrolled in their current health plan. This has been approved for a quantity limit of 1.0 with a day supply limit of 30.0. A written notification letter will follow with additional details.

## 2020-03-08 NOTE — Progress Notes (Addendum)
Shannon Gould Key: [id number] - PA Case ID: [id number] help? Call us at [phone] Outcome Approvedtoday The request has been approved. The authorization is effective for a maximum of 12 fills from 03/10/2020 to 03/09/2021, as long as the member is enrolled in their current health plan. The request was approved as submitted. A written notification letter will follow with additional details. Drug Eletriptan Hydrobromide 40MG  tablets Form Med[employer] ePA Form 2017 NCPDP

## 2020-03-11 ENCOUNTER — Ambulatory Visit (INDEPENDENT_AMBULATORY_CARE_PROVIDER_SITE_OTHER): Payer: 59 | Admitting: Psychiatry

## 2020-03-11 ENCOUNTER — Ambulatory Visit: Payer: 59 | Admitting: Psychiatry

## 2020-03-11 DIAGNOSIS — F411 Generalized anxiety disorder: Secondary | ICD-10-CM

## 2020-03-11 NOTE — Progress Notes (Signed)
[facility] Counselor/Therapist Progress Note  Patient ID: TARINI CARRIER, MRN: [id number],    Date: 03/11/2020  Time Spent: 9:50 am to 10:50am   Virtual Visit Note via MyChart Video Connected with patient by a video enabled telemedicine/telehealth application or telephone, with their informed consent, and verified patient privacy and that I am speaking with the correct person using two identifiers. I discussed the limitations, risks, security and privacy concerns of performing psychotherapy and management service by telephone and the availability of in person appointments. I also discussed with the patient that there may be a patient responsible charge related to this service. The patient expressed understanding and agreed to proceed. I discussed the treatment planning with the patient. The patient was provided an opportunity to ask questions and all were answered. The patient agreed with the plan and demonstrated an understanding of the instructions. The patient was advised to call  our office if  symptoms worsen or feel they are in a crisis state and need immediate contact.   Therapist Location: [facility] Patient Location: home   Treatment Type: Individual Therapy  Reported Symptoms: depression re:"personal", anxiety.  States anxiety is worst symptom.  Mental Status Exam:  Appearance:   Casual     Behavior:  Appropriate, Sharing and Motivated  Motor:  Normal  Speech/Language:   Clear and Coherent  Affect:  anxious, depressed  Mood:  anxious and depressed  Thought process:  normal  Thought content:    WNL  Sensory/Perceptual disturbances:    WNL  Orientation:  oriented to person, place, time/date, situation, day of week, month of year and year  Attention:  Fair  Concentration:  Fair  Memory:  WNL  Fund of knowledge:   Good  Insight:    Good and Fair  Judgment:   Good and Fair  Impulse Control:  Fair   Risk Assessment: Danger to Self:  No Self-injurious  Behavior: No Danger to Others: No Duty to Warn:no Physical Aggression / Violence:No  Access to Firearms a concern: No  Gang Involvement:No   Subjective: Patient reports anxiety is strongest symptom. Also depression. Feels she has done "some better in managing her symptoms, especially with 1 anxiety attack."  Interventions: Solution-Oriented/Positive Psychology and Ego-Supportive  Diagnosis:   ICD-10-CM   1. GAD (generalized anxiety disorder)  F41.1     Plan: Patient not signing tx plan on computer screen due to COVID-19.  Treatment Goals:  Treatment goals remain on tx plan as patient works on strategies to meet her goals. Progress is noted each session in the "Progress" section on tx Plan.  Long term goal: Reduce thoughtsthat relate to her impulsive behaviors or desire to control things out of my control, and be able to make appropriate cognitive and behavioral changes.  Short term goal: Use cognitive methods to control trigger thoughts and reduce impulsive reactions to those trigger thoughts.  Strategy: Help the patient uncover dysfunctional thoughts that lead to impulsivity. Replace each thought with a thought that is accurate, positive, self-enhancing, and adaptive.   Progress: Patient today reports anxiety as strongest symptom,and depression. Describes depression as being "more situational" and tries using strategies discussed in prior session which seems to help some. Not feeling as "worthless at work" as reported last session. Not worrying as much about what others may think , and instead focusing on her work. Some decrease in self-doubting and second-guessing especially in her single-parenting of 3 kids ages 53, 87, and 73. "Still working on being less negative  towards myself and looking for something to go wrong." Worked in session on the self-negating and always looking for what will go wrong versus right as that sets her up for negativity.  Patient motivated and  participated well in this as we used parts of her long-term goals and short-term goal in treatment plan above, in session today.  Acknowledges that she still needs to work on making her self- talk more positive.  States that her impulse control issues have improved a good bit and feels that that will eventually help her self-confidence more also.  Also shares that she is "still needing to work on her boundaries with other people but does feel that she has been trying more recently to not be as much of a people pleaser".  Reviewed with patient some positive self-care for her to continue including good sleep practices, healthy nutrition, physical activity, contacts with friends and family that are supportive, having time with her children, looking more for the positives versus the negatives, reducing her guilt, improved self talk, and looking at what she can control versus cannot control. (Plan to discuss updating her treatment goal plan next visit with patient.)  Goal review and progress/challenges noted with patient.  Next appt within 2-3 weeks.   Mathis Fare, LCSW

## 2020-03-17 MED FILL — ELETRIPTAN HYDROBROMIDE 40: 40 | 18 days supply | Qty: 6 | Fill #0

## 2020-03-31 ENCOUNTER — Encounter: Payer: Self-pay | Admitting: Orthopaedic Surgery

## 2020-03-31 ENCOUNTER — Ambulatory Visit: Payer: 59 | Admitting: Orthopaedic Surgery

## 2020-03-31 ENCOUNTER — Other Ambulatory Visit: Payer: Self-pay

## 2020-03-31 VITALS — BP 136/78 | HR 85 | Ht 63.0 in | Wt 224.0 lb

## 2020-03-31 DIAGNOSIS — M79671 Pain in right foot: Secondary | ICD-10-CM | POA: Diagnosis not present

## 2020-03-31 DIAGNOSIS — M25511 Pain in right shoulder: Secondary | ICD-10-CM

## 2020-03-31 NOTE — Progress Notes (Signed)
Patient Shannon Gould, female DOB:1975/12/16, 44 y.o. YBO:[id number]  Chief Complaint  Patient presents with  . Shoulder Pain    Rt shoulder pain for 2 wks  . Foot Pain    HPI  Shannon Gould is a 44 y.o. female who has right foot pain.  Her right foot is better.  She still has some tenderness but no pain, no swelling, no redness.  She has a new problem:  Right shoulder pain that began about two weeks ago, no trauma, no redness, no numbness.  It is more painful in the morning.     Body mass index is 39.68 kg/m.  ROS  Review of Systems  Constitutional: Positive for activity change.  Respiratory: Positive for shortness of breath.   Musculoskeletal: Positive for arthralgias and gait problem.  Psychiatric/Behavioral: The patient is nervous/anxious.   All other systems reviewed and are negative.   All other systems reviewed and are negative.  The following is a summary of the past history medically, past history surgically, known current medicines, social history and family history.  This information is gathered electronically by the computer from prior information and documentation.  I review this each visit and have found including this information at this point in the chart is beneficial and informative.    Past Medical History:  Diagnosis Date  . Anemia    hx of patient reports   . Anxiety   . Asthma   . Depression   . GERD (gastroesophageal reflux disease)   . Head ache     Past Surgical History:  Procedure Laterality Date  . CHOLECYSTECTOMY    . transvaginal mesh      Family History  Problem Relation Age of Onset  . Hypertension Mother   . Diabetes Mother   . Heart disease Father   . Sleep apnea Father   . Diabetes Father   . Heart attack Father        x2  . Heart disease Sister   . Anemia Sister   . Hypertension Sister   . Heart disease Brother   . Hypertension Brother   . Seizures Paternal Grandmother   . Anxiety disorder Daughter   .  Depression Daughter   . Asthma Daughter   . Asthma Son   . Asthma Son     Social History Social History   Tobacco Use  . Smoking status: Never Smoker  . Smokeless tobacco: Never Used  Vaping Use  . Vaping Use: Never used  Substance Use Topics  . Alcohol use: Yes    Alcohol/week: 0.0 - 1.0 standard drinks    Comment: rare  . Drug use: No    Allergies  Allergen Reactions  . Hydrocodone Itching  . Wellbutrin [Bupropion]   . Caffeine Other (See Comments)    Headache   . Lactose Intolerance (Gi) Other (See Comments)    gas    Current Outpatient Medications  Medication Sig Dispense Refill  . albuterol (PROVENTIL HFA) 108 (90 BASE) MCG/ACT inhaler Inhale 2 puffs into the lungs every 6 (six) hours as needed. For shortness of breath     . ALPRAZolam (XANAX) 1 MG tablet TAKE 1/2 TO 1 TABLET BY MOUTH TWICE A DAY AS NEEDED 60 tablet 2  . ARIPiprazole (ABILIFY) 20 MG tablet TAKE 1 TABLET (20 MG TOTAL) BY MOUTH DAILY. 90 tablet 0  . b complex vitamins tablet Take 1 tablet by mouth daily. 30 tablet 11  . eletriptan (RELPAX) 40 MG tablet Take 1  tablet for migraine.  May repeat in 2 hours if headache persists or recurs.  Max 2 tablets in 24hrs 10 tablet 5  . Erenumab-aooe (AIMOVIG) 140 MG/ML SOAJ Inject 140 mg into the skin every 28 (twenty-eight) days. 1.12 mL 11  . furosemide (LASIX) 40 MG tablet Take 40 mg by mouth daily as needed.     Marland Kitchen levonorgestrel (MIRENA, 52 MG,) 20 MCG/24HR IUD 1 each by Intrauterine route once.     . mirtazapine (REMERON) 7.5 MG tablet Take 1 tablet (7.5 mg total) by mouth at bedtime as needed. 90 tablet 1  . rizatriptan (MAXALT-MLT) 10 MG disintegrating tablet rizatriptan 10 mg disintegrating tablet    . venlafaxine XR (EFFEXOR-XR) 150 MG 24 hr capsule Take 1 capsule (150 mg total) by mouth daily with breakfast. 90 capsule 0  . venlafaxine XR (EFFEXOR-XR) 75 MG 24 hr capsule Take 1 capsule (75 mg total) by mouth daily with breakfast. Take with 150 mg= 225 mg.  90 capsule 0  . zolpidem (AMBIEN) 10 MG tablet Take 10 mg by mouth at bedtime as needed for sleep.    Marland Kitchen TROKENDI XR 200 MG CP24 TAKE 1 CAPSULE BY MOUTH AT BEDTIME. (Patient not taking: Reported on 03/08/2020) 90 capsule 3  . zolpidem (AMBIEN) 10 MG tablet TAKE 1 TABLET (10 MG TOTAL) BY MOUTH AT BEDTIME AS NEEDED FOR SLEEP. 30 tablet 0   No current facility-administered medications for this visit.     Physical Exam  Blood pressure 136/78, pulse 85, height 5\' 3"  (1.6 m), weight 224 lb (101.6 kg).  Constitutional: overall normal hygiene, normal nutrition, well developed, normal grooming, normal body habitus. Assistive device:none  Musculoskeletal: gait and station Limp none, muscle tone and strength are normal, no tremors or atrophy is present.  .  Neurological: coordination overall normal.  Deep tendon reflex/nerve stretch intact.  Sensation normal.  Cranial nerves II-XII intact.   Skin:   Normal overall no scars, lesions, ulcers or rashes. No psoriasis.  Psychiatric: Alert and oriented x 3.  Recent memory intact, remote memory unclear.  Normal mood and affect. Well groomed.  Good eye contact.  Cardiovascular: overall no swelling, no varicosities, no edema bilaterally, normal temperatures of the legs and arms, no clubbing, cyanosis and good capillary refill.  Lymphatic: palpation is normal.  Right foot with no pain, no swelling, full ROM ankle, NV intact.  Normal gait.  Right shoulder with tenderness in the extremes, NV intact.  Grips normal.  ROM neck full.  All other systems reviewed and are negative   The patient has been educated about the nature of the problem(s) and counseled on treatment options.  The patient appeared to understand what I have discussed and is in agreement with it.  Encounter Diagnoses  Name Primary?  . Acute foot pain, right Yes  . Acute pain of right shoulder     PLAN Call if any problems.  Precautions discussed.  Continue current medications.   I  have advised to take Advil or Aleve for the shoulder.  Call me if worse.  Use creams to it also.  Return to clinic prn   Electronically Signed , MD 10/28/20219:02 AM

## 2020-04-04 ENCOUNTER — Ambulatory Visit (INDEPENDENT_AMBULATORY_CARE_PROVIDER_SITE_OTHER): Payer: 59 | Admitting: Psychiatry

## 2020-04-04 DIAGNOSIS — F411 Generalized anxiety disorder: Secondary | ICD-10-CM | POA: Diagnosis not present

## 2020-04-04 NOTE — Progress Notes (Signed)
[facility] Counselor/Therapist Progress Note  Patient ID: Shannon Gould, MRN: [id number],    Date: 04/04/2020  Time Spent: 60 minutes   8:00am to 9:00am  Virtual Visit Note via Copywriter, advertising with patient by a video enabled telemedicine/telehealth application or telephone, with their informed consent, and verified patient privacy and that I am speaking with the correct person using two identifiers. I discussed the limitations, risks, security and privacy concerns of performing psychotherapy and management service by telephone and the availability of in person appointments. I also discussed with the patient that there may be a patient responsible charge related to this service. The patient expressed understanding and agreed to proceed. I discussed the treatment planning with the patient. The patient was provided an opportunity to ask questions and all were answered. The patient agreed with the plan and demonstrated an understanding of the instructions. The patient was advised to call  our office if  symptoms worsen or feel they are in a crisis state and need immediate contact.   Therapist Location: [facility] Patient Location: work office   Treatment Type: Individual Therapy  Reported Symptoms: anxiety increased some recently due to son's football injury this weekend; "before that I had been;   Progress made as she reports no depression most recently.  Mental Status Exam:  Appearance:   Neat     Behavior:  Appropriate, Sharing and Motivated  Motor:  Normal  Speech/Language:   Clear and Coherent  Affect:  anxious  Mood:  anxious  Thought process:  goal directed  Thought content:    some obsessiveness  Sensory/Perceptual disturbances:    WNL  Orientation:  oriented to person, place, time/date, situation, day of week, month of year and year  Attention:  Fair  Concentration:  Fair  Memory:  WNL  Fund of knowledge:   Good  Insight:    Good  Judgment:    Good  Impulse Control:  Good   Risk Assessment: Danger to Self:  No Self-injurious Behavior: No Danger to Others: No Duty to Warn:no Physical Aggression / Violence:No  Access to Firearms a concern: No  Gang Involvement:No   Subjective: Patient today reporting anxiety increased some more recently. Feeling like she is handling stress better at times.  Things at home with daughter going some better and she is still seeing her counselor.  Interventions: Solution-Oriented/Positive Psychology and Ego-Supportive  Diagnosis:   ICD-10-CM   1. GAD (generalized anxiety disorder)  F41.1     Plan: Patient not signing tx plan on computer screen due to COVID-19.  Treatment Goals:  Treatment goals remain on tx plan as patient works on strategies to meet her goals. Progress is noted each session in the "Progress" section on tx Plan.  Long term goal: Reduce thoughtsthat relate to her impulsive behaviors or desire to control things out of my control, and be able to make appropriate cognitive and behavioral changes.  Short term goal: Reduce the anxiety that keeps me from doing things I really want to do now and looking into the future.  Use cognitive methods to control trigger thoughts and reduce impulsive reactions to those trigger thoughts.  Strategy: Help the patient uncover dysfunctional thoughts that lead to impulsivity. Replace each thought with a thought that is accurate, positive, self-enhancing, and adaptive.   Progress: Patient today reports anxiety increased more recently, partly due to son's football injury.  Still notices that anxiety sometimes "blocks me from doing things I want to do but can't  because of being anxious."  States that this happens several times a week, "but it used to be more often, happening each day."  "Doing better with my self-talk, when I'm anxious, I'm trying to talk myself down and am having some success." Also staying on meds as prescribed. Worked  well in session today on talking back to anxiety and not letting it incapacitate her and feel helpless, and how to be more rational in her decisions and thought patterns.  We updated her Short Term goal today to reflect more accurately her current focus.  She has done well and not worrying as much about what others may think of her, decreasing her self-doubting, working on being less self negating, and not focusing on what may go wrong versus right.  Impulse control issues remain improved.  Self-confidence is a little better and patient feels with working more on her goals and strategies, that may help her self-confidence.  Setting appropriate boundaries with other people as a challenge but she has begun to do that more recently.  Encouraged positive self-care including being in touch with people who are supportive of her, enjoying time with each of her children, healthy nutrition and exercise, continue work on letting go of guilt, staying in the present versus the past or future, focusing more on what she can control versus cannot control, looking more for the positives versus the negatives, and continuing her efforts to make self talk more positive.  Goal review and progress/challenges noted with patient.  Next appt within 2-3 weeks.   Mathis Fare, LCSW

## 2020-04-14 MED FILL — AIMOVIG 140 MG/ML SOAJ: 140 | 28 days supply | Qty: 1 | Fill #1

## 2020-04-19 ENCOUNTER — Other Ambulatory Visit: Payer: Self-pay | Admitting: Physician Assistant

## 2020-04-20 MED FILL — ARIPiprazole 20 MG TABS: 20 | 90 days supply | Qty: 90 | Fill #0

## 2020-05-02 ENCOUNTER — Other Ambulatory Visit: Payer: Self-pay

## 2020-05-02 ENCOUNTER — Encounter: Payer: Self-pay | Admitting: Physician Assistant

## 2020-05-02 ENCOUNTER — Other Ambulatory Visit: Payer: Self-pay | Admitting: Physician Assistant

## 2020-05-02 ENCOUNTER — Ambulatory Visit (INDEPENDENT_AMBULATORY_CARE_PROVIDER_SITE_OTHER): Payer: 59 | Admitting: Physician Assistant

## 2020-05-02 DIAGNOSIS — F3160 Bipolar disorder, current episode mixed, unspecified: Secondary | ICD-10-CM | POA: Diagnosis not present

## 2020-05-02 DIAGNOSIS — G4700 Insomnia, unspecified: Secondary | ICD-10-CM

## 2020-05-02 DIAGNOSIS — F411 Generalized anxiety disorder: Secondary | ICD-10-CM

## 2020-05-02 DIAGNOSIS — F514 Sleep terrors [night terrors]: Secondary | ICD-10-CM | POA: Diagnosis not present

## 2020-05-02 MED ORDER — ZOLPIDEM TARTRATE 10 MG PO TABS: 10.0000 mg | ORAL_TABLET | Freq: Every evening | ORAL | 5 refills | Status: DC | PRN

## 2020-05-02 MED ORDER — MIRTAZAPINE 7.5 MG PO TABS: 7.5000 mg | ORAL_TABLET | Freq: Every evening | ORAL | 1 refills | Status: DC | PRN

## 2020-05-02 MED ORDER — VENLAFAXINE HCL ER 75 MG PO CP24: 75.0000 mg | ORAL_CAPSULE | Freq: Every day | ORAL | 1 refills | Status: DC

## 2020-05-02 MED ORDER — ALPRAZOLAM 1 MG PO TABS: ORAL_TABLET | ORAL | 5 refills | Status: DC

## 2020-05-02 MED ORDER — VENLAFAXINE HCL ER 150 MG PO CP24: 150.0000 mg | ORAL_CAPSULE | Freq: Every day | ORAL | 1 refills | Status: DC

## 2020-05-02 MED FILL — ALPRAZOLAM 1 MG TABS: 1 | 30 days supply | Qty: 60 | Fill #0

## 2020-05-02 MED FILL — ZOLPIDEM TARTRATE 10 MG TAB: 10 | 30 days supply | Qty: 30 | Fill #0

## 2020-05-02 MED FILL — VENLAFAXINE HCL ER 150 MG C: 150 | 90 days supply | Qty: 90 | Fill #0

## 2020-05-02 NOTE — Progress Notes (Signed)
[facility] Med Check  Patient ID: Shannon Gould,  MRN: [id number]  PCP: Assunta Found, MD  Date of Evaluation: 05/02/2020 Time spent:30 minutes  Chief Complaint:  Chief Complaint    Anxiety; Depression; Insomnia      HISTORY/CURRENT STATUS: For routine med check.  Doing really well as far as her mental health goes.  States she is able to enjoy things.  Energy and motivation are good.  In fact she is working 50 hours a week at her job.  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  They have just hired someone so hopefully her overtime will not last too long.  She is really kind of glad to have it.  Appetite is normal.  No weight loss or gain.  No extreme sadness, tearfulness, or hopelessness.  Personal hygiene is normal.  She does not cry easily.  She denies suicidal or homicidal thoughts.  There are nights when she does not sleep as well as she would like.  She has trouble staying asleep sometimes.  If not every night, and usually she is able to go back to sleep.  There have been times where she will stay awake for as long as an hour.  She has no trouble falling asleep.  She uses the mirtazapine and Ambien almost every night.  No reports of nightmares now.  Patient denies increased energy with decreased need for sleep, no increased talkativeness, no racing thoughts, no impulsivity or risky behaviors, no increased spending, no increased libido, no grandiosity, no irritability, no paranoia, and no AH/VH.  Denies dizziness, syncope, seizures, numbness, tingling, tremor, tics, unsteady gait, slurred speech, confusion. Denies muscle or joint pain, stiffness, or dystonia.  Individual Medical History/ Review of Systems: Changes? :No    Past medications for mental health diagnoses include: Effexor, Vistaril, Xanax, Ambien, Zoloft, Wellbutrin which caused increased agitation and suicidal ideations, trazodone, Abilify, Sonata  Allergies:  Hydrocodone, Wellbutrin [bupropion], Caffeine, and Lactose intolerance (gi)  Current Medications:  Current Outpatient Medications:  .  albuterol (PROVENTIL HFA) 108 (90 BASE) MCG/ACT inhaler, Inhale 2 puffs into the lungs every 6 (six) hours as needed. For shortness of breath , Disp: , Rfl:  .  ALPRAZolam (XANAX) 1 MG tablet, TAKE 1/2 TO 1 TABLET BY MOUTH TWICE A DAY AS NEEDED, Disp: 60 tablet, Rfl: 5 .  ARIPiprazole (ABILIFY) 20 MG tablet, TAKE 1 TABLET (20 MG TOTAL) BY MOUTH DAILY., Disp: 90 tablet, Rfl: 0 .  b complex vitamins tablet, Take 1 tablet by mouth daily., Disp: 30 tablet, Rfl: 11 .  eletriptan (RELPAX) 40 MG tablet, Take 1 tablet for migraine.  May repeat in 2 hours if headache persists or recurs.  Max 2 tablets in 24hrs, Disp: 10 tablet, Rfl: 5 .  Erenumab-aooe (AIMOVIG) 140 MG/ML SOAJ, Inject 140 mg into the skin every 28 (twenty-eight) days., Disp: 1.12 mL, Rfl: 11 .  furosemide (LASIX) 40 MG tablet, Take 40 mg by mouth daily as needed. , Disp: , Rfl:  .  levonorgestrel (MIRENA, 52 MG,) 20 MCG/24HR IUD, 1 each by Intrauterine route once. , Disp: , Rfl:  .  mirtazapine (REMERON) 7.5 MG tablet, Take 1-2 tablets (7.5-15 mg total) by mouth at bedtime as needed., Disp: 180 tablet, Rfl: 1 .  venlafaxine XR (EFFEXOR-XR) 150 MG 24 hr capsule, Take 1 capsule (150 mg total) by mouth daily with breakfast., Disp: 90 capsule, Rfl: 1 .  venlafaxine XR (EFFEXOR-XR) 75 MG  24 hr capsule, Take 1 capsule (75 mg total) by mouth daily with breakfast. Take with 150 mg= 225 mg., Disp: 90 capsule, Rfl: 1 .  zolpidem (AMBIEN) 10 MG tablet, Take 10 mg by mouth at bedtime as needed for sleep., Disp: , Rfl:  .  rizatriptan (MAXALT-MLT) 10 MG disintegrating tablet, rizatriptan 10 mg disintegrating tablet, Disp: , Rfl:  .  TROKENDI XR 200 MG CP24, TAKE 1 CAPSULE BY MOUTH AT BEDTIME. (Patient not taking: Reported on 03/08/2020), Disp: 90 capsule, Rfl: 3 .  zolpidem (AMBIEN) 10 MG tablet, Take 1 tablet (10 mg  total) by mouth at bedtime as needed for sleep., Disp: 30 tablet, Rfl: 5 Medication Side Effects: none  Family Medical/ Social History: Changes? No  MENTAL HEALTH EXAM:  There were no vitals taken for this visit.There is no height or weight on file to calculate BMI.  General Appearance: Casual, Neat and Well Groomed  Eye Contact:  Good  Speech:  Clear and Coherent  Volume:  Normal  Mood:  Euthymic  Affect:  Appropriate  Thought Process:  Goal Directed and Descriptions of Associations: Intact  Orientation:  Full (Time, Place, and Person)  Thought Content: Logical   Suicidal Thoughts:  No  Homicidal Thoughts:  No  Memory:  WNL  Judgement:  Good  Insight:  Good  Psychomotor Activity:  Normal  Concentration:  Concentration: Good  Recall:  Good  Fund of Knowledge: Good  Language: Good  Assets:  Desire for Improvement  ADL's:  Intact  Cognition: WNL  Prognosis:  Good   Most recent pertinent labs are scanned in chart. 02/01/2020 Glucose was 78, hemoglobin A1c was 5.4 Lipid panel, total cholesterol 206, triglycerides 52, HDL 79, LDL 118  DIAGNOSES:    ICD-10-CM   1. GAD (generalized anxiety disorder)  F41.1   2. Bipolar I disorder, most recent episode mixed (HCC)  F31.60   3. Insomnia, unspecified type  G47.00   4. Night terrors  F51.4     Receiving Psychotherapy: Yes With Rockne Menghini, LCSW   RECOMMENDATIONS:  PDMP reviewed.  I provided 30 minutes of face-to-face time during this encounter, including time spent reviewing labs. I am glad to see her doing better!   Discussed sleep hygiene.  She can take up to 2 pills of the mirtazapine.  Sometimes that is beneficial to help with a good solid night of sleep. Increase mirtazapine 7.5 mg, to 1-2 p.o. nightly, as needed. Continue Effexor XR 150 mg +75 mg daily.   Continue Xanax 1 mg, 1/2-1 twice daily as needed. Continue Abilify 20 mg daily. Continue Ambien 10 mg 1 nightly as needed sleep. Continue therapy with Rockne Menghini,  LCSW. Return in 6 months.  Melony Overly, PA-C

## 2020-05-12 MED FILL — VENLAFAXINE HCL ER 75 MG CA: 75 | 90 days supply | Qty: 90 | Fill #0

## 2020-05-23 MED FILL — AIMOVIG 140 MG/ML SOAJ: 140 | 28 days supply | Qty: 1 | Fill #0

## 2020-07-26 MED FILL — ELETRIPTAN HYDROBROMIDE 40: 40 | 18 days supply | Qty: 6 | Fill #0

## 2020-07-26 MED FILL — AIMOVIG 140 MG/ML SOAJ: 140 | 28 days supply | Qty: 1 | Fill #1

## 2020-07-28 ENCOUNTER — Ambulatory Visit (INDEPENDENT_AMBULATORY_CARE_PROVIDER_SITE_OTHER): Payer: 59 | Admitting: Orthopaedic Surgery

## 2020-07-28 ENCOUNTER — Encounter: Payer: Self-pay | Admitting: Orthopaedic Surgery

## 2020-07-28 ENCOUNTER — Other Ambulatory Visit: Payer: Self-pay | Admitting: Orthopaedic Surgery

## 2020-07-28 ENCOUNTER — Other Ambulatory Visit: Payer: Self-pay

## 2020-07-28 VITALS — BP 135/86 | HR 83 | Ht 63.0 in | Wt 236.0 lb

## 2020-07-28 DIAGNOSIS — Z6841 Body Mass Index (BMI) 40.0 and over, adult: Secondary | ICD-10-CM | POA: Diagnosis not present

## 2020-07-28 DIAGNOSIS — G43009 Migraine without aura, not intractable, without status migrainosus: Secondary | ICD-10-CM | POA: Diagnosis not present

## 2020-07-28 DIAGNOSIS — Z0001 Encounter for general adult medical examination with abnormal findings: Secondary | ICD-10-CM | POA: Diagnosis not present

## 2020-07-28 DIAGNOSIS — M25511 Pain in right shoulder: Secondary | ICD-10-CM | POA: Diagnosis not present

## 2020-07-28 DIAGNOSIS — G8929 Other chronic pain: Secondary | ICD-10-CM

## 2020-07-28 DIAGNOSIS — Z23 Encounter for immunization: Secondary | ICD-10-CM | POA: Diagnosis not present

## 2020-07-28 DIAGNOSIS — R310 Gross hematuria: Secondary | ICD-10-CM | POA: Diagnosis not present

## 2020-07-28 DIAGNOSIS — Z1389 Encounter for screening for other disorder: Secondary | ICD-10-CM | POA: Diagnosis not present

## 2020-07-28 DIAGNOSIS — R600 Localized edema: Secondary | ICD-10-CM | POA: Diagnosis not present

## 2020-07-28 MED ORDER — NAPROXEN 500 MG PO TABS: 500.0000 mg | ORAL_TABLET | Freq: Two times a day (BID) | ORAL | 5 refills | Status: DC

## 2020-07-28 MED FILL — NAPROXEN 500 MG TABS: 500 | 30 days supply | Qty: 60 | Fill #0

## 2020-07-28 MED FILL — PHENTERMINE 37.5 MG TABLET: 37.5 | 90 days supply | Qty: 90 | Fill #0

## 2020-07-28 NOTE — Progress Notes (Signed)
Patient PI:Shannon Gould, female DOB:1976-04-12, 45 y.o. YSA:[id number]  Chief Complaint  Patient presents with  . Arm Pain    Right arm pain, Patient reports its going on about 6 weeks and have some tenderness and swelling when moving.     HPI  Shannon Gould is a 45 y.o. female who has had recurrent pain in the right shoulder again.  I saw her last in October.  She has pain with overhead use and rolling on it at night.  Motion is good but painful.  She has no numbness, no redness,no trauma.  She had taken Aleve in the past which helped.   Body mass index is 41.81 kg/m.  ROS  Review of Systems  Constitutional: Positive for activity change.  Respiratory: Positive for shortness of breath.   Musculoskeletal: Positive for arthralgias.  Psychiatric/Behavioral: The patient is nervous/anxious.   All other systems reviewed and are negative.   All other systems reviewed and are negative.  The following is a summary of the past history medically, past history surgically, known current medicines, social history and family history.  This information is gathered electronically by the computer from prior information and documentation.  I review this each visit and have found including this information at this point in the chart is beneficial and informative.    Past Medical History:  Diagnosis Date  . Anemia    hx of patient reports   . Anxiety   . Asthma   . Depression   . GERD (gastroesophageal reflux disease)   . Head ache     Past Surgical History:  Procedure Laterality Date  . CHOLECYSTECTOMY    . transvaginal mesh      Family History  Problem Relation Age of Onset  . Hypertension Mother   . Diabetes Mother   . Heart disease Father   . Sleep apnea Father   . Diabetes Father   . Heart attack Father        x2  . Heart disease Sister   . Anemia Sister   . Hypertension Sister   . Heart disease Brother   . Hypertension Brother   . Seizures Paternal Grandmother    . Anxiety disorder Daughter   . Depression Daughter   . Asthma Daughter   . Asthma Son   . Asthma Son     Social History Social History   Tobacco Use  . Smoking status: Never Smoker  . Smokeless tobacco: Never Used  Vaping Use  . Vaping Use: Never used  Substance Use Topics  . Alcohol use: Yes    Alcohol/week: 0.0 - 1.0 standard drinks    Comment: rare  . Drug use: No    Allergies  Allergen Reactions  . Hydrocodone Itching  . Wellbutrin [Bupropion]   . Caffeine Other (See Comments)    Headache   . Lactose Intolerance (Gi) Other (See Comments)    gas    Current Outpatient Medications  Medication Sig Dispense Refill  . albuterol (VENTOLIN HFA) 108 (90 Base) MCG/ACT inhaler Inhale 2 puffs into the lungs every 6 (six) hours as needed. For shortness of breath    . ALPRAZolam (XANAX) 1 MG tablet TAKE 1/2 TO 1 TABLET BY MOUTH TWICE A DAY AS NEEDED 60 tablet 5  . ARIPiprazole (ABILIFY) 20 MG tablet TAKE 1 TABLET (20 MG TOTAL) BY MOUTH DAILY. 90 tablet 0  . b complex vitamins tablet Take 1 tablet by mouth daily. 30 tablet 11  . eletriptan (RELPAX) 40  MG tablet Take 1 tablet for migraine.  May repeat in 2 hours if headache persists or recurs.  Max 2 tablets in 24hrs 10 tablet 5  . Erenumab-aooe (AIMOVIG) 140 MG/ML SOAJ Inject 140 mg into the skin every 28 (twenty-eight) days. 1.12 mL 11  . furosemide (LASIX) 40 MG tablet Take 40 mg by mouth daily as needed.     Marland Kitchen levonorgestrel (MIRENA, 52 MG,) 20 MCG/24HR IUD 1 each by Intrauterine route once.     . mirtazapine (REMERON) 7.5 MG tablet Take 1-2 tablets (7.5-15 mg total) by mouth at bedtime as needed. 180 tablet 1  . venlafaxine XR (EFFEXOR-XR) 150 MG 24 hr capsule Take 1 capsule (150 mg total) by mouth daily with breakfast. 90 capsule 1  . venlafaxine XR (EFFEXOR-XR) 75 MG 24 hr capsule Take 1 capsule (75 mg total) by mouth daily with breakfast. Take with 150 mg= 225 mg. 90 capsule 1  . zolpidem (AMBIEN) 10 MG tablet Take 10 mg  by mouth at bedtime as needed for sleep.    Marland Kitchen zolpidem (AMBIEN) 10 MG tablet Take 1 tablet (10 mg total) by mouth at bedtime as needed for sleep. 30 tablet 5   No current facility-administered medications for this visit.     Physical Exam  Blood pressure 135/86, pulse 83, height 5\' 3"  (1.6 m), weight 236 lb (107 kg).  Constitutional: overall normal hygiene, normal nutrition, well developed, normal grooming, normal body habitus. Assistive device:none  Musculoskeletal: gait and station Limp none, muscle tone and strength are normal, no tremors or atrophy is present.  .  Neurological: coordination overall normal.  Deep tendon reflex/nerve stretch intact.  Sensation normal.  Cranial nerves II-XII intact.   Skin:   Normal overall no scars, lesions, ulcers or rashes. No psoriasis.  Psychiatric: Alert and oriented x 3.  Recent memory intact, remote memory unclear.  Normal mood and affect. Well groomed.  Good eye contact.  Cardiovascular: overall no swelling, no varicosities, no edema bilaterally, normal temperatures of the legs and arms, no clubbing, cyanosis and good capillary refill.  Lymphatic: palpation is normal.  Right shoulder has good ROM but tender in the extremes.  All other systems reviewed and are negative   The patient has been educated about the nature of the problem(s) and counseled on treatment options.  The patient appeared to understand what I have discussed and is in agreement with it.  Encounter Diagnosis  Name Primary?  . Chronic pain in right shoulder Yes    PLAN Call if any problems.  Precautions discussed.  Continue current medications. I will begin Naprosyn.  Do not take Aleve.  Return to clinic 2 weeks   Begin PT.  Electronically Signed , MD 2/24/20229:33 AM

## 2020-07-29 ENCOUNTER — Other Ambulatory Visit (HOSPITAL_COMMUNITY): Payer: Self-pay | Admitting: Family Medicine

## 2020-07-29 MED FILL — VIT D2 1.25 MG (50,000 UNIT: 1.25 MG | 84 days supply | Qty: 12 | Fill #0

## 2020-08-01 ENCOUNTER — Telehealth: Payer: Self-pay

## 2020-08-01 DIAGNOSIS — G8929 Other chronic pain: Secondary | ICD-10-CM

## 2020-08-01 NOTE — Telephone Encounter (Signed)
Erie Noe from [facility] called to say that they do take Shannon Gould, but they are out of network. Spoke with patient and she wants to go to [facility].

## 2020-08-04 DIAGNOSIS — Z01419 Encounter for gynecological examination (general) (routine) without abnormal findings: Secondary | ICD-10-CM | POA: Diagnosis not present

## 2020-08-04 DIAGNOSIS — Z124 Encounter for screening for malignant neoplasm of cervix: Secondary | ICD-10-CM | POA: Diagnosis not present

## 2020-08-04 DIAGNOSIS — Z1231 Encounter for screening mammogram for malignant neoplasm of breast: Secondary | ICD-10-CM | POA: Diagnosis not present

## 2020-08-04 DIAGNOSIS — Z3202 Encounter for pregnancy test, result negative: Secondary | ICD-10-CM | POA: Diagnosis not present

## 2020-08-04 DIAGNOSIS — Z113 Encounter for screening for infections with a predominantly sexual mode of transmission: Secondary | ICD-10-CM | POA: Diagnosis not present

## 2020-08-05 ENCOUNTER — Other Ambulatory Visit: Payer: Self-pay | Admitting: Physician Assistant

## 2020-08-06 MED FILL — ARIPIPRAZOLE 20 MG TABS: 20 | 90 days supply | Qty: 90 | Fill #0

## 2020-08-11 ENCOUNTER — Other Ambulatory Visit: Payer: Self-pay

## 2020-08-11 ENCOUNTER — Encounter: Payer: Self-pay | Admitting: Orthopaedic Surgery

## 2020-08-11 ENCOUNTER — Ambulatory Visit: Payer: 59 | Admitting: Orthopaedic Surgery

## 2020-08-11 VITALS — BP 136/86 | HR 88 | Ht 63.0 in | Wt 237.0 lb

## 2020-08-11 DIAGNOSIS — M25511 Pain in right shoulder: Secondary | ICD-10-CM

## 2020-08-11 DIAGNOSIS — G8929 Other chronic pain: Secondary | ICD-10-CM

## 2020-08-11 NOTE — Progress Notes (Signed)
Patient Shannon Gould, female DOB:January 28, 1976, 45 y.o. NTZ:[id number]  Chief Complaint  Patient presents with  . Shoulder Pain    Right patinent reprots she is doing better,     HPI  Shannon Gould is a 45 y.o. female who has right shoulder pain.  She is taking the Naprosyn and is improving.  She is pleased. She still has pain but it is much less. She has been doing her exercises as well. She has no numbness, no trauma, no swelling.   Body mass index is 41.98 kg/m.  The patient meets the AMA guidelines for Morbid (severe) obesity with a BMI > 40.0 and I have recommended weight loss.   ROS  Review of Systems  Constitutional: Positive for activity change.  Respiratory: Positive for shortness of breath.   Musculoskeletal: Positive for arthralgias.  Psychiatric/Behavioral: The patient is nervous/anxious.   All other systems reviewed and are negative.   All other systems reviewed and are negative.  The following is a summary of the past history medically, past history surgically, known current medicines, social history and family history.  This information is gathered electronically by the computer from prior information and documentation.  I review this each visit and have found including this information at this point in the chart is beneficial and informative.    Past Medical History:  Diagnosis Date  . Anemia    hx of patient reports   . Anxiety   . Asthma   . Depression   . GERD (gastroesophageal reflux disease)   . Head ache     Past Surgical History:  Procedure Laterality Date  . CHOLECYSTECTOMY    . transvaginal mesh      Family History  Problem Relation Age of Onset  . Hypertension Mother   . Diabetes Mother   . Heart disease Father   . Sleep apnea Father   . Diabetes Father   . Heart attack Father        x2  . Heart disease Sister   . Anemia Sister   . Hypertension Sister   . Heart disease Brother   . Hypertension Brother   . Seizures  Paternal Grandmother   . Anxiety disorder Daughter   . Depression Daughter   . Asthma Daughter   . Asthma Son   . Asthma Son     Social History Social History   Tobacco Use  . Smoking status: Never Smoker  . Smokeless tobacco: Never Used  Vaping Use  . Vaping Use: Never used  Substance Use Topics  . Alcohol use: Yes    Alcohol/week: 0.0 - 1.0 standard drinks    Comment: rare  . Drug use: No    Allergies  Allergen Reactions  . Hydrocodone Itching  . Wellbutrin [Bupropion]   . Caffeine Other (See Comments)    Headache   . Lactose Intolerance (Gi) Other (See Comments)    gas    Current Outpatient Medications  Medication Sig Dispense Refill  . albuterol (VENTOLIN HFA) 108 (90 Base) MCG/ACT inhaler Inhale 2 puffs into the lungs every 6 (six) hours as needed. For shortness of breath    . ALPRAZolam (XANAX) 1 MG tablet TAKE 1/2 TO 1 TABLET BY MOUTH TWICE A DAY AS NEEDED 60 tablet 5  . ARIPiprazole (ABILIFY) 20 MG tablet TAKE 1 TABLET (20 MG TOTAL) BY MOUTH DAILY. 90 tablet 0  . b complex vitamins tablet Take 1 tablet by mouth daily. 30 tablet 11  . eletriptan (RELPAX) 40  MG tablet Take 1 tablet for migraine.  May repeat in 2 hours if headache persists or recurs.  Max 2 tablets in 24hrs 10 tablet 5  . Erenumab-aooe (AIMOVIG) 140 MG/ML SOAJ Inject 140 mg into the skin every 28 (twenty-eight) days. 1.12 mL 11  . furosemide (LASIX) 40 MG tablet Take 40 mg by mouth daily as needed.     Marland Kitchen levonorgestrel (MIRENA, 52 MG,) 20 MCG/24HR IUD 1 each by Intrauterine route once.     . mirtazapine (REMERON) 7.5 MG tablet Take 1-2 tablets (7.5-15 mg total) by mouth at bedtime as needed. 180 tablet 1  . naproxen (NAPROSYN) 500 MG tablet Take 1 tablet (500 mg total) by mouth 2 (two) times daily with a meal. 60 tablet 5  . venlafaxine XR (EFFEXOR-XR) 150 MG 24 hr capsule Take 1 capsule (150 mg total) by mouth daily with breakfast. 90 capsule 1  . venlafaxine XR (EFFEXOR-XR) 75 MG 24 hr capsule  Take 1 capsule (75 mg total) by mouth daily with breakfast. Take with 150 mg= 225 mg. 90 capsule 1  . zolpidem (AMBIEN) 10 MG tablet Take 10 mg by mouth at bedtime as needed for sleep.    Marland Kitchen zolpidem (AMBIEN) 10 MG tablet Take 1 tablet (10 mg total) by mouth at bedtime as needed for sleep. 30 tablet 5   No current facility-administered medications for this visit.     Physical Exam  Blood pressure 136/86, pulse 88, height 5\' 3"  (1.6 m), weight 237 lb (107.5 kg).  Constitutional: overall normal hygiene, normal nutrition, well developed, normal grooming, normal body habitus. Assistive device:none  Musculoskeletal: gait and station Limp none, muscle tone and strength are normal, no tremors or atrophy is present.  .  Neurological: coordination overall normal.  Deep tendon reflex/nerve stretch intact.  Sensation normal.  Cranial nerves II-XII intact.   Skin:   Normal overall no scars, lesions, ulcers or rashes. No psoriasis.  Psychiatric: Alert and oriented x 3.  Recent memory intact, remote memory unclear.  Normal mood and affect. Well groomed.  Good eye contact.  Cardiovascular: overall no swelling, no varicosities, no edema bilaterally, normal temperatures of the legs and arms, no clubbing, cyanosis and good capillary refill.  Right shoulder is tender but has full ROM.  NV intact.  ROM neck is full.  Lymphatic: palpation is normal.  All other systems reviewed and are negative   The patient has been educated about the nature of the problem(s) and counseled on treatment options.  The patient appeared to understand what I have discussed and is in agreement with it.  Encounter Diagnosis  Name Primary?  . Chronic pain in right shoulder Yes    PLAN Call if any problems.  Precautions discussed.  Continue current medications.   Return to clinic 1 month   Electronically Signed , MD 3/10/20228:22 AM

## 2020-08-19 ENCOUNTER — Other Ambulatory Visit: Payer: Self-pay

## 2020-08-19 ENCOUNTER — Ambulatory Visit (HOSPITAL_COMMUNITY): Payer: 59 | Attending: Orthopaedic Surgery | Admitting: Occupational Therapy

## 2020-08-19 ENCOUNTER — Encounter (HOSPITAL_COMMUNITY): Payer: Self-pay | Admitting: Occupational Therapy

## 2020-08-19 DIAGNOSIS — R29898 Other symptoms and signs involving the musculoskeletal system: Secondary | ICD-10-CM | POA: Insufficient documentation

## 2020-08-19 DIAGNOSIS — M25511 Pain in right shoulder: Secondary | ICD-10-CM | POA: Insufficient documentation

## 2020-08-19 NOTE — Patient Instructions (Signed)
  1) Flexion Wall Stretch    Face wall, place affected handon wall in front of you. Slide hand up the wall  and lean body in towards the wall. Hold for 10 seconds. Repeat 3-5 times. 1-2 times/day.     2) Towel Stretch with Internal Rotation   Or     Gently pull up (or to the side) your affected arm  behind your back with the assist of a towel. Hold 10 seconds, repeat 3-5 times. 1-2 times/day.             3) Corner Stretch    Stand at a corner of a wall, place your arms on the walls with elbows bent. Lean into the corner until a stretch is felt along the front of your chest and/or shoulders. Hold for 10 seconds. Repeat 3-5X, 1-2 times/day.    4) Posterior Capsule Stretch    Bring the involved arm across chest. Grasp elbow and pull toward chest until you feel a stretch in the back of the upper arm and shoulder. Hold 10 seconds. Repeat 3-5X. Complete 1-2 times/day.      

## 2020-08-19 NOTE — Therapy (Signed)
[facility] [address] [place], [place], [address] Phone: [phone]   Fax:  [phone]  Occupational Therapy Evaluation  Patient Details  Name: Shannon Gould MRN: [id number] Date of Birth: 1975-07-08 Referring Provider (OT): Dr. Darreld Mclean   Encounter Date: 08/19/2020   OT End of Session - 08/19/20 1150    Visit Number 1    Number of Visits 4    Date for OT Re-Evaluation 09/18/20    Authorization Type [employer], $20 copay    Authorization Time Period medical review after 25th visit    OT Start Time 1117    OT Stop Time 1147    OT Time Calculation (min) 30 min    Activity Tolerance Patient tolerated treatment well    Behavior During Therapy [facility] for tasks assessed/performed           Past Medical History:  Diagnosis Date  . Anemia    hx of patient reports   . Anxiety   . Asthma   . Depression   . GERD (gastroesophageal reflux disease)   . Head ache     Past Surgical History:  Procedure Laterality Date  . CHOLECYSTECTOMY    . transvaginal mesh      There were no vitals filed for this visit.   Subjective Assessment - 08/19/20 1147    Subjective  S: It's been hurting for a couple of months.    Pertinent History Pt is a 45 y/o female presenting with right shoulder pain that began at the beginning of February. Pt reports pain has improved since she began taking the Naprosyn prescription. Pt was referred to occupational therapy for evaluation and treatment by Dr. Darreld Mclean.    Special Tests FOTO: 59/100    Patient Stated Goals To be able to use my arm with less pain.    Currently in Pain? Yes    Pain Score 1     Pain Location Shoulder    Pain Orientation Right    Pain Descriptors / Indicators Aching    Pain Type Acute pain    Pain Radiating Towards N/A    Pain Onset More than a month ago    Pain Frequency Intermittent    Aggravating Factors  use    Pain Relieving Factors rest, pain medication    Effect of  Pain on Daily Activities min/mod effect on ADLs    Multiple Pain Sites No             [facility] OT Assessment - 08/19/20 1119      Assessment   Medical Diagnosis right shoulder pain    Referring Provider (OT) Dr. Darreld Mclean    Onset Date/Surgical Date 07/05/20    Hand Dominance Left    Prior Therapy None      Precautions   Precautions None      Restrictions   Weight Bearing Restrictions No      Balance Screen   Has the patient fallen in the past 6 months No    Has the patient had a decrease in activity level because of a fear of falling?  No    Is the patient reluctant to leave their home because of a fear of falling?  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      ADL   ADL comments Pt  is having difficulty with wiping tables at work, dressing, reaching overhead with arms crossed (undressing). Pain is waking up at night from pain.      Written Expression   Dominant Hand Left      Cognition   Overall Cognitive Status Within Functional Limits for tasks assessed      Observation/Other Assessments   Focus on Therapeutic Outcomes (FOTO)  59/100      ROM / Strength   AROM / PROM / Strength AROM;PROM;Strength      Palpation   Palpation comment Mod fascial restrictions at anterior shoulder region      AROM   Overall AROM Comments Assessed seated, er/IR adducted    AROM Assessment Site Shoulder    Right/Left Shoulder Right    Right Shoulder Flexion 124 Degrees    Right Shoulder ABduction 90 Degrees    Right Shoulder Internal Rotation 90 Degrees    Right Shoulder External Rotation 63 Degrees      PROM   Overall PROM Comments Assessed supine, er/IR adducted    PROM Assessment Site Shoulder    Right/Left Shoulder Right    Right Shoulder Flexion 157 Degrees    Right Shoulder ABduction 150 Degrees    Right Shoulder Internal Rotation 90 Degrees     Right Shoulder External Rotation 90 Degrees      Strength   Overall Strength Comments Assessed seated, er/IR adducted    Strength Assessment Site Shoulder    Right/Left Shoulder Right    Right Shoulder Flexion 4+/5   equivalent to LUE   Right Shoulder ABduction 4+/5   equivalent to LUE   Right Shoulder Internal Rotation 4+/5   equivalent to LUE   Right Shoulder External Rotation 4/5                           OT Education - 08/19/20 1138    Education Details shoulder stretches    Person(s) Educated Patient    Methods Explanation;Demonstration;Handout    Comprehension Verbalized understanding;Returned demonstration            OT Short Term Goals - 08/19/20 1153      OT SHORT TERM GOAL #1   Title Pt will be provided with and educated on HEP to improve mobility required to perform ADL and work tasks.    Time 4    Period Weeks    Status New    Target Date 09/18/20      OT SHORT TERM GOAL #2   Title Pt will decrease RUE pain to 2/10 or less to improve ability to sleep 4+ hours without waking due to pain.    Time 4    Period Weeks    Status New      OT SHORT TERM GOAL #3   Title Pt will decrease RUE fascial restrictions to trace amounts to improve mobility required for reaching behind back.    Time 4    Period Weeks    Status New      OT SHORT TERM GOAL #4   Title Pt will increase RUE A/ROM to [facility] to improve ability to performing dressing tasks and functional reaching tasks.    Time 4    Period Weeks    Status New                    Plan - 08/19/20 1150    Clinical Impression Statement A: Pt is a 45 y/o female presenting  with right shoulder pain limiting ability to perform ADLs and work tasks at her highest level of functioning. Pt demonstrates good strength in RUE, has limited ROM and functional use due to pain.    OT Occupational Profile and History Problem Focused Assessment - Including review of records relating to presenting problem     Occupational performance deficits (Please refer to evaluation for details): ADL's;IADL's;Rest and Sleep;Work;Leisure    Body Structure / Function / Physical Skills ADL;UE functional use;Fascial restriction;Pain;ROM;IADL;Strength    Rehab Potential Good    Clinical Decision Making Limited treatment options, no task modification necessary    Comorbidities Affecting Occupational Performance: None    Modification or Assistance to Complete Evaluation  No modification of tasks or assist necessary to complete eval    OT Frequency 1x / week    OT Duration 4 weeks    OT Treatment/Interventions Self-care/ADL training;Ultrasound;Patient/family education;Passive range of motion;Cryotherapy;Electrical Stimulation;Moist Heat;Therapeutic exercise;Manual Therapy;Therapeutic activities    Plan P: Pt will benefit from skilled OT services to decrease pain and fascial restrictions, increase ROM and functional use of RUE during ADL and work tasks. Treatment plan: myofascial release and manual techniques, P/ROM, AA/ROM, A/ROM, general RUE strengthening/scapular stability, modalities prn    OT Home Exercise Plan eval: shoulder stretches    Consulted and Agree with Plan of Care Patient           Patient will benefit from skilled therapeutic intervention in order to improve the following deficits and impairments:   Body Structure / Function / Physical Skills: ADL,UE functional use,Fascial restriction,Pain,ROM,IADL,Strength       Visit Diagnosis: Acute pain of right shoulder  Other symptoms and signs involving the musculoskeletal system    Problem List Patient Active Problem List   Diagnosis Date Noted  . GERD (gastroesophageal reflux disease)   . Presenile dementia with depressive features (HCC) 04/04/2018  . GAD (generalized anxiety disorder) 04/04/2018  . Insomnia 04/04/2018  . Major depressive disorder, recurrent episode, moderate (HCC) 03/28/2018  . Major depressive disorder, recurrent severe  without psychotic features [facility]) 10/31/2016   Ezra Sites, OTR/L  [phone] 08/19/2020, 11:55 AM  [facility] [address] [place], [place], [address] Phone: [phone]   Fax:  [phone]  Name: CUMI SANAGUSTIN MRN: [id number] Date of Birth: 05-Jun-1975

## 2020-08-22 DIAGNOSIS — N72 Inflammatory disease of cervix uteri: Secondary | ICD-10-CM | POA: Diagnosis not present

## 2020-08-22 DIAGNOSIS — B977 Papillomavirus as the cause of diseases classified elsewhere: Secondary | ICD-10-CM | POA: Diagnosis not present

## 2020-08-22 DIAGNOSIS — Z7689 Persons encountering health services in other specified circumstances: Secondary | ICD-10-CM | POA: Diagnosis not present

## 2020-08-22 DIAGNOSIS — Z3202 Encounter for pregnancy test, result negative: Secondary | ICD-10-CM | POA: Diagnosis not present

## 2020-08-22 DIAGNOSIS — N870 Mild cervical dysplasia: Secondary | ICD-10-CM | POA: Diagnosis not present

## 2020-08-22 DIAGNOSIS — R87618 Other abnormal cytological findings on specimens from cervix uteri: Secondary | ICD-10-CM | POA: Diagnosis not present

## 2020-08-25 ENCOUNTER — Other Ambulatory Visit (HOSPITAL_BASED_OUTPATIENT_CLINIC_OR_DEPARTMENT_OTHER): Payer: Self-pay

## 2020-08-26 MED FILL — AIMOVIG 140 MG/ML SOAJ: 140 | 28 days supply | Qty: 1 | Fill #2

## 2020-09-01 ENCOUNTER — Encounter (HOSPITAL_COMMUNITY): Payer: Self-pay | Admitting: Specialist

## 2020-09-01 ENCOUNTER — Ambulatory Visit (HOSPITAL_COMMUNITY): Payer: 59 | Admitting: Specialist

## 2020-09-01 ENCOUNTER — Other Ambulatory Visit: Payer: Self-pay

## 2020-09-01 DIAGNOSIS — R29898 Other symptoms and signs involving the musculoskeletal system: Secondary | ICD-10-CM

## 2020-09-01 DIAGNOSIS — M25511 Pain in right shoulder: Secondary | ICD-10-CM

## 2020-09-01 NOTE — Therapy (Signed)
[place], [place], [address] Phone: [phone]   Fax:  [phone]  Occupational Therapy Treatment  Patient Details  Name: Shannon Gould MRN: [id number] Date of Birth: 1976-02-22 Referring Provider (OT): Dr. Sanjuana Kava   Encounter Date: 09/01/2020   OT End of Session - 09/01/20 1554    Visit Number 2    Number of Visits 4    Date for OT Re-Evaluation 09/18/20    Authorization Type UMR, $20 copay    Authorization Time Period medical review after 25th visit    OT Start Time 1430    OT Stop Time 1515    OT Time Calculation (min) 45 min    Activity Tolerance Patient tolerated treatment well    Behavior During Therapy [facility] for tasks assessed/performed           Past Medical History:  Diagnosis Date  . Anemia    hx of patient reports   . Anxiety   . Asthma   . Depression   . GERD (gastroesophageal reflux disease)   . Head ache     Past Surgical History:  Procedure Laterality Date  . CHOLECYSTECTOMY    . transvaginal mesh      There were no vitals filed for this visit.   Subjective Assessment - 09/01/20 1553    Subjective  S:  The exercises are going well.  Some of them are easier, and some of them hurt (mainly internal rotation towel stretch)    Currently in Pain? Yes    Pain Score 2     Pain Location Shoulder    Pain Orientation Right    Pain Descriptors / Indicators Aching    Pain Type Acute pain    Pain Radiating Towards upper arm    Pain Onset More than a month ago    Aggravating Factors  use              [facility] OT Assessment - 09/01/20 0001      Assessment   Medical Diagnosis right shoulder pain    Referring Provider (OT) Dr. Sanjuana Kava      Precautions   Precautions None      Restrictions   Weight Bearing Restrictions No                    OT Treatments/Exercises (OP) - 09/01/20 0001      Exercises   Exercises Shoulder      Shoulder Exercises:  Supine   Protraction AAROM;10 reps    Horizontal ABduction PROM;5 reps;AAROM;10 reps    External Rotation PROM;5 reps;AAROM;10 reps    Internal Rotation PROM;5 reps;AAROM;10 reps    Flexion PROM;5 reps;AAROM;10 reps    ABduction PROM;5 reps;AAROM;10 reps      Shoulder Exercises: Seated   Elevation AROM;10 reps    Extension AROM;10 reps    Row AROM;10 reps      Shoulder Exercises: Therapy Ball   Flexion 15 reps    ABduction 15 reps    Right/Left 5 reps    Right/Left Limitations min facilitation to reach ball in posterior end range      Shoulder Exercises: ROM/Strengthening   Wall Wash 1'    Thumb Tacks 1'    Prot/Ret//Elev/Dep 1'      Manual Therapy   Manual Therapy Myofascial release    Manual therapy comments manual therapy completed seperately from all other interventions this date    Myofascial Release myofascial release  and manual stretching to right upper arm, scapular, and shoulder area to decrease pain and improve pain free mobility in her right shoulder region.                    OT Short Term Goals - 09/01/20 1557      OT SHORT TERM GOAL #1   Title Pt will be provided with and educated on HEP to improve mobility required to perform ADL and work tasks.    Time 4    Period Weeks    Status On-going    Target Date 09/18/20      OT SHORT TERM GOAL #2   Title Pt will decrease RUE pain to 2/10 or less to improve ability to sleep 4+ hours without waking due to pain.    Time 4    Period Weeks    Status On-going      OT SHORT TERM GOAL #3   Title Pt will decrease RUE fascial restrictions to trace amounts to improve mobility required for reaching behind back.    Time 4    Period Weeks    Status On-going      OT SHORT TERM GOAL #4   Title Pt will increase RUE A/ROM to [facility] to improve ability to performing dressing tasks and functional reaching tasks.    Time 4    Period Weeks    Status On-going                    Plan - 09/01/20 1555     Clinical Impression Statement A:  Educated patient to avoid over stretching with internal rotation stretch in order to not exacerbate her pain.  Mod fascial restrictions noted in anterior upper arm.  Able to complete aa/rom in supine with pain at end range of each movement.    Body Structure / Function / Physical Skills ADL;UE functional use;Fascial restriction;Pain;ROM;IADL;Strength    OT Treatment/Interventions Self-care/ADL training;Ultrasound;Patient/family education;Passive range of motion;Cryotherapy;Electrical Stimulation;Moist Heat;Therapeutic exercise;Manual Therapy;Therapeutic activities    Plan P:  add aa/rom in seated, attempt a/rom in supine as well as proximal shoulder strengthening.           Patient will benefit from skilled therapeutic intervention in order to improve the following deficits and impairments:   Body Structure / Function / Physical Skills: ADL,UE functional use,Fascial restriction,Pain,ROM,IADL,Strength       Visit Diagnosis: Acute pain of right shoulder  Other symptoms and signs involving the musculoskeletal system    Problem List Patient Active Problem List   Diagnosis Date Noted  . GERD (gastroesophageal reflux disease)   . Presenile dementia with depressive features ([place]) 04/04/2018  . GAD (generalized anxiety disorder) 04/04/2018  . Insomnia 04/04/2018  . Major depressive disorder, recurrent episode, moderate ([place]) 03/28/2018  . Major depressive disorder, recurrent severe without psychotic features [facility]) 10/31/2016    Vangie Bicker, [place], OTR/L [phone]  09/01/2020, 4:01 PM  [place] [address] [place], [place], [address] Phone: [phone]   Fax:  [phone]  Name: Shannon Gould MRN: [id number] Date of Birth: [birth year]

## 2020-09-06 ENCOUNTER — Encounter (HOSPITAL_COMMUNITY): Payer: Self-pay | Admitting: Occupational Therapy

## 2020-09-06 ENCOUNTER — Other Ambulatory Visit: Payer: Self-pay

## 2020-09-06 ENCOUNTER — Ambulatory Visit (HOSPITAL_COMMUNITY): Payer: 59 | Attending: Orthopaedic Surgery | Admitting: Occupational Therapy

## 2020-09-06 DIAGNOSIS — R29898 Other symptoms and signs involving the musculoskeletal system: Secondary | ICD-10-CM | POA: Diagnosis not present

## 2020-09-06 DIAGNOSIS — M25511 Pain in right shoulder: Secondary | ICD-10-CM | POA: Insufficient documentation

## 2020-09-06 NOTE — Patient Instructions (Signed)

## 2020-09-06 NOTE — Therapy (Signed)
[facility] [address] [place], [place], [address] Phone: [phone]   Fax:  [phone]  Occupational Therapy Treatment  Patient Details  Name: Shannon Gould MRN: [id number] Date of Birth: January 21, 1976 Referring Provider (OT): Dr. Darreld Mclean   Encounter Date: 09/06/2020   OT End of Session - 09/06/20 0829    Visit Number 3    Number of Visits 4    Date for OT Re-Evaluation 09/18/20    Authorization Type UMR, $20 copay    Authorization Time Period medical review after 25th visit    OT Start Time [phone]   pt arrived late   OT Stop Time 0815    OT Time Calculation (min) 37 min    Activity Tolerance Patient tolerated treatment well    Behavior During Therapy [facility] for tasks assessed/performed           Past Medical History:  Diagnosis Date  . Anemia    hx of patient reports   . Anxiety   . Asthma   . Depression   . GERD (gastroesophageal reflux disease)   . Head ache     Past Surgical History:  Procedure Laterality Date  . CHOLECYSTECTOMY    . transvaginal mesh      There were no vitals filed for this visit.   Subjective Assessment - 09/06/20 0739    Subjective  S: It's worse at night but feels better in the mornings.    Currently in Pain? No/denies              [facility] OT Assessment - 09/06/20 0739      Assessment   Medical Diagnosis right shoulder pain      Precautions   Precautions None                    OT Treatments/Exercises (OP) - 09/06/20 0740      Exercises   Exercises Shoulder      Shoulder Exercises: Supine   Protraction AROM;10 reps    Horizontal ABduction PROM;5 reps;AROM;10 reps    External Rotation PROM;5 reps;AROM;10 reps    Internal Rotation PROM;5 reps;AROM;10 reps    Flexion PROM;5 reps;AROM;10 reps    ABduction PROM;5 reps;AROM;10 reps      Shoulder Exercises: Standing   Protraction AROM;10 reps    Horizontal ABduction AROM;10 reps    External Rotation AROM;10  reps    Internal Rotation AROM;10 reps    Flexion AROM;10 reps    ABduction AROM;10 reps    Extension Theraband;10 reps    Theraband Level (Shoulder Extension) Level 2 (Red)    Row Theraband;10 reps    Theraband Level (Shoulder Row) Level 2 (Red)    Retraction Limitations    Retraction Limitations unable to complete due to pain    Other Standing Exercises Y lift off, 10X      Shoulder Exercises: ROM/Strengthening   UBE (Upper Arm Bike) Level 1 2' forward 2' reverse, pace: 4.5    Wall Wash 1'    Proximal Shoulder Strengthening, Supine 10X each, no rest breaks    Other ROM/Strengthening Exercises ball pass with medium pink ball for IR, keeping low, 10X    Other ROM/Strengthening Exercises proximal shoulder strengthening on doorway, 1' flexion      Manual Therapy   Manual Therapy Myofascial release    Manual therapy comments manual therapy completed seperately from all other interventions this date    Myofascial Release myofascial release and manual stretching  to right upper arm, scapular, and shoulder area to decrease pain and improve pain free mobility in her right shoulder region.                  OT Education - 09/06/20 0759    Education Details shoulder A/ROM    Person(s) Educated Patient    Methods Explanation;Demonstration;Handout    Comprehension Verbalized understanding;Returned demonstration            OT Short Term Goals - 09/01/20 1557      OT SHORT TERM GOAL #1   Title Pt will be provided with and educated on HEP to improve mobility required to perform ADL and work tasks.    Time 4    Period Weeks    Status On-going    Target Date 09/18/20      OT SHORT TERM GOAL #2   Title Pt will decrease RUE pain to 2/10 or less to improve ability to sleep 4+ hours without waking due to pain.    Time 4    Period Weeks    Status On-going      OT SHORT TERM GOAL #3   Title Pt will decrease RUE fascial restrictions to trace amounts to improve mobility required for  reaching behind back.    Time 4    Period Weeks    Status On-going      OT SHORT TERM GOAL #4   Title Pt will increase RUE A/ROM to [facility] to improve ability to performing dressing tasks and functional reaching tasks.    Time 4    Period Weeks    Status On-going                    Plan - 09/06/20 0810    Clinical Impression Statement A: Pt reporting continued sharp, shooting pains with IR at home. Has not noticed any improvement in her pain level at home or at work. Continued with myofascial release to address fascial restrictions in upper arm. Pt with good ROM during passive stretching, progressed to A/ROM in supine and standing. Also added proximal shoulder strengthening in supine, on doorway, and added scapular theraband. Pt with sharp pain with retraction during scapular theraband, therefore discontinued. Verbal cuing for form and technique.    Body Structure / Function / Physical Skills ADL;UE functional use;Fascial restriction;Pain;ROM;IADL;Strength    Plan P: Reassessment, determine if pt needs to discharge and return to MD for further testing.    OT Home Exercise Plan eval: shoulder stretches; 4/5: shoulder A/ROM    Consulted and Agree with Plan of Care Patient           Patient will benefit from skilled therapeutic intervention in order to improve the following deficits and impairments:   Body Structure / Function / Physical Skills: ADL,UE functional use,Fascial restriction,Pain,ROM,IADL,Strength       Visit Diagnosis: Acute pain of right shoulder  Other symptoms and signs involving the musculoskeletal system    Problem List Patient Active Problem List   Diagnosis Date Noted  . GERD (gastroesophageal reflux disease)   . Presenile dementia with depressive features ([facility]) 04/04/2018  . GAD (generalized anxiety disorder) 04/04/2018  . Insomnia 04/04/2018  . Major depressive disorder, recurrent episode, moderate ([facility]) 03/28/2018  . Major depressive disorder,  recurrent severe without psychotic features [facility]) 10/31/2016   Ezra Sites, OTR/L  [phone] 09/06/2020, 8:29 AM  [place] [facility] [address] [place], [place], [address] Phone: [phone]  Fax:  [phone]  Name: ANMOL PASCHEN MRN: [id number] Date of Birth: 24-May-1976

## 2020-09-07 MED FILL — Venlafaxine HCl Cap ER 24HR 150 MG (Base Equivalent): ORAL | 90 days supply | Qty: 90 | Fill #0 | Status: AC

## 2020-09-07 MED FILL — Venlafaxine HCl Cap ER 24HR 75 MG (Base Equivalent): ORAL | 90 days supply | Qty: 90 | Fill #0 | Status: AC

## 2020-09-08 ENCOUNTER — Other Ambulatory Visit (HOSPITAL_COMMUNITY): Payer: Self-pay

## 2020-09-09 NOTE — Progress Notes (Signed)
NEUROLOGY FOLLOW UP OFFICE NOTE  Shannon Gould [id number]  Assessment/Plan:   Migraine without aura, without status migrainosus, not intractable  1.  Migraine prevention:  Aimovig 140mg  Q4w 2.  Migraine rescue:  Relpax 40mg .  I will also give her Nurtec to try - I asked her to try it as a first line (before eletriptan) and as a second line (after eletriptan).  If either is effective, she will contact me for prescription. 3.  Limit use of pain relievers to no more than 2 days out of week to prevent risk of rebound or medication-overuse headache. 4.  Keep headache diary 5.  Follow up 6 months.  Subjective:  Shannon Gould is a 44year oldleft-handed female withgeneralized anxiety disorderand majordepressionwhofollows up for migraines.  UPDATE: Aimovig increased in October. Intensity:  Mild Duration:  Severe for 2 hours but dull for rest of day.   Frequency:  No migraines since October However, she had 2 to 3 migraines this past week.  She just took her last shot on 4/5.  Unsure if change in temperature and seasons a factor.   Current NSAIDS:Ibuprofen 600mg , naproxen Current analgesics:none Current triptans:Relpax 40 mg Current ergotamine:None Current anti-emetic:none Current muscle relaxants:None Current anti-anxiolytic:Xanax Current sleep aide:Ambien Current Antihypertensive medications:None Current Antidepressant medications:Venlafaxine XL 225 mg daily, mirtapamine PRN QHS, Abilify Current Anticonvulsant medications: none Current anti-CGRP:Aimovig 140mg  Current Vitamins/Herbal/Supplements:Vitamin D Current Antihistamines/Decongestants:None Other therapy:None Hormone/birth control:Mirena  Caffeine:no Alcohol:occasional Smoker:no Diet:hydrates Exercise:no Depression:yes; Anxiety:Yes. She sees a November for therapy. Other pain:Suboccipital pain Sleep hygiene:Insomnia. Uses sonata and weighted  blanket.  HISTORY: Onset: 36s Location:Usually left sided Quality:pounding InitialIntensity:6/10.[name] new headache, thunderclap headache or severe headache that wakes herfrom sleep. Aura:no Prodrome:no Postdrome:no Associated symptoms: Nausea,photophobia, phonophobia, blurred vision, left eye droops, osmophobia.[name] associated vomiting orunilateral numbness or weakness. InitialDuration:1 day InitialFrequency:Once a month InitialFrequency of abortive medication:once or twice a month Triggers: Caffeine Relieving factors: Laying down Activity:Aggravates She alsoreportednear daily dull bifrontal headaches which she treats with ibuprofen 600mg .  CT of head from 01/25/2015 was personally reviewed and revealed no acute intracranial abnormalities.  Past NSAIDS:Ibuprofen, naproxen Past analgesics:Tylenol, Excedrin, Ultracet Past abortive triptans:Maxalt 5 mg, Sumatriptan tablet (caused nausea) Past abortive ergotamine:none Past muscle relaxants:none Past anti-emetic:none Past antihypertensive medications:none Past antidepressant medications:Wellbutrin, Celexa, Zoloft 100 mg Past anticonvulsant medications:Topiramate ER (cognitive problems) Past anti-CGRP:none Past vitamins/Herbal/Supplements:none Past antihistamines/decongestants:none Other past therapies:none  PAST MEDICAL HISTORY: Past Medical History:  Diagnosis Date  . Anemia    hx of patient reports   . Anxiety   . Asthma   . Depression   . GERD (gastroesophageal reflux disease)   . Head ache     MEDICATIONS: Current Outpatient Medications on File Prior to Visit  Medication Sig Dispense Refill  . albuterol (VENTOLIN HFA) 108 (90 Base) MCG/ACT inhaler Inhale 2 puffs into the lungs every 6 (six) hours as needed. For shortness of breath    . ALPRAZolam (XANAX) 1 MG tablet TAKE 1/2 TO 1 TABLET BY MOUTH TWICE A DAY AS NEEDED 60 tablet 5  . ARIPiprazole  (ABILIFY) 20 MG tablet TAKE 1 TABLET BY MOUTH ONCE DAILY 90 tablet 0  . b complex vitamins tablet Take 1 tablet by mouth daily. 30 tablet 11  . eletriptan (RELPAX) 40 MG tablet TAKE 1 TABLET FOR MIGRAINE. MAY REPEAT IN 2 HOURS IF HEADACHE PERSISTS OR RECURS. MAX 2 TABLETS IN 24HRS 10 tablet 5  . Erenumab-aooe 140 MG/ML SOAJ INJECT 1 PEN (140 MG) INTO THE SKIN EVERY 28 DAYS. 1 mL 9  .  furosemide (LASIX) 40 MG tablet Take 40 mg by mouth daily as needed.     Marland Kitchen levonorgestrel (MIRENA, 52 MG,) 20 MCG/24HR IUD 1 each by Intrauterine route once.     . mirtazapine (REMERON) 7.5 MG tablet Take 1-2 tablets (7.5-15 mg total) by mouth at bedtime as needed. 180 tablet 1  . naproxen (NAPROSYN) 500 MG tablet TAKE 1 TABLET BY MOUTH 2 TIMES DAILY WITH A MEAL 60 tablet 5  . phentermine (ADIPEX-P) 37.5 MG tablet TAKE 1 TABLET (37.5 MG) BY MOUTH ONCE DAILY BEFORE BREAKFAST 90 tablet 0  . venlafaxine XR (EFFEXOR-XR) 150 MG 24 hr capsule TAKE 1 CAPSULE BY MOUTH DAILY WITH BREAKFAST 90 capsule 1  . venlafaxine XR (EFFEXOR-XR) 75 MG 24 hr capsule TAKE 1 CAPSULE BY MOUTH DAILY WITH BREAKFAST. TAKE WITH 150 MG= 225 MG. 90 capsule 1  . Vitamin D, Ergocalciferol, (DRISDOL) 1.25 MG (50000 UNIT) CAPS capsule TAKE 1 CAPSULE BY MOUTH ONCE WEEKLY FOR 12 WEEKS. ONCE FINISHED TAKE 2000 UNITS OTC. 12 capsule 0  . zolpidem (AMBIEN) 10 MG tablet Take 10 mg by mouth at bedtime as needed for sleep.    Marland Kitchen zolpidem (AMBIEN) 10 MG tablet TAKE 1 TABLET BY MOUTH AT BEDTIME AS NEEDED FOR SLEEP 30 tablet 5   No current facility-administered medications on file prior to visit.    ALLERGIES: Allergies  Allergen Reactions  . Hydrocodone Itching  . Wellbutrin [Bupropion]   . Caffeine Other (See Comments)    Headache   . Lactose Intolerance (Gi) Other (See Comments)    gas    FAMILY HISTORY: Family History  Problem Relation Age of Onset  . Hypertension Mother   . Diabetes Mother   . Heart disease Father   . Sleep apnea Father   .  Diabetes Father   . Heart attack Father        x2  . Heart disease Sister   . Anemia Sister   . Hypertension Sister   . Heart disease Brother   . Hypertension Brother   . Seizures Paternal Grandmother   . Anxiety disorder Daughter   . Depression Daughter   . Asthma Daughter   . Asthma Son   . Asthma Son       Objective:  Blood pressure (!) 144/86, pulse 99, resp. rate 18, height 5\' 3"  (1.6 m), weight 237 lb (107.5 kg), SpO2 100 %. General: No acute distress.  Patient appears well-groomed.   Head:  Normocephalic/atraumatic Eyes:  Fundi examined but not visualized Neck: supple, no paraspinal tenderness, full range of motion Heart:  Regular rate and rhythm Lungs:  Clear to auscultation bilaterally Back: No paraspinal tenderness Neurological Exam: alert and oriented to person, place, and time. Attention span and concentration intact, recent and remote memory intact, fund of knowledge intact.  Speech fluent and not dysarthric, language intact.  CN II-XII intact. Bulk and tone normal, muscle strength 5/5 throughout.  Sensation to light touch, temperature and vibration intact.  Deep tendon reflexes 2+ throughout, toes downgoing.  Finger to nose and heel to shin testing intact.  Gait normal, Romberg negative.     , DO  CC: Shon Millet, MD

## 2020-09-12 ENCOUNTER — Ambulatory Visit (HOSPITAL_COMMUNITY): Payer: 59 | Admitting: Specialist

## 2020-09-12 ENCOUNTER — Encounter: Payer: Self-pay | Admitting: Neurology

## 2020-09-12 ENCOUNTER — Encounter (HOSPITAL_COMMUNITY): Payer: Self-pay | Admitting: Specialist

## 2020-09-12 ENCOUNTER — Other Ambulatory Visit: Payer: Self-pay

## 2020-09-12 ENCOUNTER — Ambulatory Visit: Payer: 59 | Admitting: Neurology

## 2020-09-12 VITALS — BP 144/86 | HR 99 | Resp 18 | Ht 63.0 in | Wt 237.0 lb

## 2020-09-12 DIAGNOSIS — R29898 Other symptoms and signs involving the musculoskeletal system: Secondary | ICD-10-CM

## 2020-09-12 DIAGNOSIS — M25511 Pain in right shoulder: Secondary | ICD-10-CM | POA: Diagnosis not present

## 2020-09-12 DIAGNOSIS — G43009 Migraine without aura, not intractable, without status migrainosus: Secondary | ICD-10-CM

## 2020-09-12 NOTE — Patient Instructions (Signed)
  1. Aimovig 140mg  every 28 days 2. Take Nurtec for headache.  Maximum 1 tablet in 24 hours.  Try as a first line (before eletriptan) as well as a second line (after taking eletriptan).  If effective, contact me for prescription. 3. Limit use of pain relievers to no more than 2 days out of the week.  These medications include acetaminophen, NSAIDs (ibuprofen/Advil/Motrin, naproxen/Aleve, triptans (Imitrex/sumatriptan), Excedrin, and narcotics.  This will help reduce risk of rebound headaches. 4. Be aware of common food triggers:  - Caffeine:  coffee, black tea, cola, Mt. Dew  - Chocolate  - Dairy:  aged cheeses (brie, blue, cheddar, gouda, [place], provolone, Pierce, Swiss, etc), chocolate milk, buttermilk, sour cream, limit eggs and yogurt  - Nuts, peanut butter  - Alcohol  - Cereals/grains:  FRESH breads (fresh bagels, sourdough, doughnuts), yeast productions  - Processed/canned/aged/cured meats (pre-packaged deli meats, hotdogs)  - MSG/glutamate:  soy sauce, flavor enhancer, pickled/preserved/marinated foods  - Sweeteners:  aspartame (Equal, Nutrasweet).  Sugar and Splenda are okay  - Vegetables:  legumes (lima beans, lentils, snow peas, fava beans, pinto peans, peas, garbanzo beans), sauerkraut, onions, olives, pickles  - Fruit:  avocados, bananas, citrus fruit (orange, lemon, grapefruit), mango  - Other:  Frozen meals, macaroni and cheese 5. Routine exercise 6. Stay adequately hydrated (aim for 64 oz water daily) 7. Keep headache diary 8. Maintain proper stress management 9. Maintain proper sleep hygiene 10. Do not skip meals 11. Consider supplements:  magnesium citrate 400mg  daily, riboflavin 400mg  daily, coenzyme Q10 100mg  three times daily.

## 2020-09-12 NOTE — Patient Instructions (Signed)
1) (Home) Extension: Isometric / Bilateral Arm Retraction - Sitting   Facing anchor, hold hands and elbow at shoulder height, with elbow bent.  Pull arms back to squeeze shoulder blades together. Repeat 10-15 times. 1-3 times/day.   2) (Clinic) Extension / Flexion (Assist)   Face anchor, pull arms back, keeping elbow straight, and squeze shoulder blades together. Repeat 10-15 times. 1-3 times/day.   Copyright  VHI. All rights reserved.   3) (Home) Retraction: Row - Bilateral (Anchor)   Facing anchor, arms reaching forward, pull hands toward stomach, keeping elbows bent and at your sides and pinching shoulder blades together. Repeat 10-15 times. 1-3 times/day.   Copyright  VHI. All rights reserved.  Strengthening: Resisted Internal Rotation    Hold tubing in left hand, elbow at side and forearm out. Rotate forearm in across body. Repeat ____ times per set. Do ____ sets per session. Do ____ sessions per day.  http://orth.exer.us/831   Copyright  VHI. All rights reserved.  Strengthening: Resisted External Rotation    Hold tubing in right hand, elbow at side and forearm across body. Rotate forearm out. Repeat ____ times per set. Do ____ sets per session. Do ____ sessions per day.  http://orth.exer.us/829   Copyright  VHI. All rights reserved.

## 2020-09-12 NOTE — Therapy (Signed)
[place], [place], [address] Phone: [phone]   Fax:  [phone]  Occupational Therapy Treatment  Patient Details  Name: Shannon Gould MRN: [id number] Date of Birth: [birth year] Referring Provider (OT): Dr. Sanjuana Kava   Encounter Date: 09/12/2020   OT End of Session - 09/12/20 1650    Visit Number 4    Number of Visits 4    Date for OT Re-Evaluation 09/18/20    Authorization Type [employer], $20 copay    Authorization Time Period medical review after 25th visit    OT Start Time 1435    OT Stop Time 1515    OT Time Calculation (min) 40 min    Activity Tolerance Patient tolerated treatment well    Behavior During Therapy [facility] for tasks assessed/performed           Past Medical History:  Diagnosis Date  . Anemia    hx of patient reports   . Anxiety   . Asthma   . Depression   . GERD (gastroesophageal reflux disease)   . Head ache     Past Surgical History:  Procedure Laterality Date  . CHOLECYSTECTOMY    . transvaginal mesh      There were no vitals filed for this visit.   Subjective Assessment - 09/12/20 1649    Subjective  S:  I still have the same level of pain with certain movements.  Yesterday it felt ok.    Special Tests FOTO: 59/100    Currently in Pain? Yes    Pain Score 6     Pain Location Shoulder    Pain Orientation Right    Pain Descriptors / Indicators Aching    Pain Type Acute pain    Pain Onset More than a month ago              [facility] OT Assessment - 09/12/20 0001      Assessment   Medical Diagnosis right shoulder pain    Referring Provider (OT) Dr. Sanjuana Kava      Precautions   Precautions None      Observation/Other Assessments   Focus on Therapeutic Outcomes (FOTO)  59/100      AROM   Right Shoulder Flexion 145 Degrees    Right Shoulder ABduction 120 Degrees    Right Shoulder Internal Rotation 90 Degrees    Right Shoulder External Rotation 81 Degrees       Strength   Right Shoulder Flexion 5/5    Right Shoulder ABduction 5/5    Right Shoulder Internal Rotation 5/5    Right Shoulder External Rotation 4+/5                    OT Treatments/Exercises (OP) - 09/12/20 0001      Shoulder Exercises: Standing   External Rotation Theraband;5 reps    Theraband Level (Shoulder External Rotation) Level 2 (Red)    Internal Rotation Theraband;5 reps    Theraband Level (Shoulder Internal Rotation) Level 2 (Red)    Extension Theraband;5 reps    Theraband Level (Shoulder Extension) Level 2 (Red)    Row Theraband;5 reps    Theraband Level (Shoulder Row) Level 2 (Red)    Retraction Theraband;5 reps   sub max range   Theraband Level (Shoulder Retraction) Level 2 (Red)      Manual Therapy   Manual Therapy Myofascial release    Manual therapy comments manual therapy completed seperately from all other  interventions this date    Myofascial Release myofascial release and manual stretching to right upper arm, scapular, and shoulder area to decrease pain and improve pain free mobility in her right shoulder region.                  OT Education - 09/12/20 1649    Education Details educated patient on progress towards goals for movement and lack of progress towards decreasing pain.  educated on tband for scapular stability.  recommended follow up with MD to discuss possibility of MRI and injection.    Person(s) Educated Patient    Methods Explanation;Demonstration;Handout    Comprehension Verbalized understanding;Returned demonstration            OT Short Term Goals - 09/12/20 1504      OT SHORT TERM GOAL #1   Title Pt will be provided with and educated on HEP to improve mobility required to perform ADL and work tasks.    Time 4    Period Weeks    Status Achieved    Target Date 09/18/20      OT SHORT TERM GOAL #2   Title Pt will decrease RUE pain to 2/10 or less to improve ability to sleep 4+ hours without waking due to  pain.    Time 4    Period Weeks    Status Not Met      OT SHORT TERM GOAL #3   Title Pt will decrease RUE fascial restrictions to trace amounts to improve mobility required for reaching behind back.    Time 4    Period Weeks    Status Not Met      OT SHORT TERM GOAL #4   Title Pt will increase RUE A/ROM to [facility] to improve ability to performing dressing tasks and functional reaching tasks.    Time 4    Period Weeks    Status Achieved                    Plan - 09/12/20 1651    Clinical Impression Statement A:  Patient has improved A/ROM  and strength, however, pain level and fascial restrictions have not improved.  Patient and OT agree that discussion with MD to determine next course of action to address pain level is appropriate.    Body Structure / Function / Physical Skills ADL;UE functional use;Fascial restriction;Pain;ROM;IADL;Strength    Plan P:  DC from skilled OT intervention this date.    OT Home Exercise Plan eval: shoulder stretches; 4/5: shoulder A/ROM, 4/11:  scapular tband    Consulted and Agree with Plan of Care Patient           Patient will benefit from skilled therapeutic intervention in order to improve the following deficits and impairments:   Body Structure / Function / Physical Skills: ADL,UE functional use,Fascial restriction,Pain,ROM,IADL,Strength       Visit Diagnosis: Other symptoms and signs involving the musculoskeletal system  Acute pain of right shoulder    Problem List Patient Active Problem List   Diagnosis Date Noted  . GERD (gastroesophageal reflux disease)   . Presenile dementia with depressive features ([place]) 04/04/2018  . GAD (generalized anxiety disorder) 04/04/2018  . Insomnia 04/04/2018  . Major depressive disorder, recurrent episode, moderate ([place]) 03/28/2018  . Major depressive disorder, recurrent severe without psychotic features ([place]) 10/31/2016  OCCUPATIONAL THERAPY DISCHARGE SUMMARY  Visits from Start of Care:  4 Current functional level related to goals / functional outcomes: See above  Remaining deficits: Significant pain with internal rotation, flexion and abduction at 70-120 degree range  Education / Equipment: See above   Plan: Patient agrees to discharge.  Patient goals were partially met. Patient is being discharged due to lack of progress.  ?????         Vangie Bicker, [place], OTR/L [phone]  09/12/2020, 4:54 PM  [place] [address] [place], [place], [address] Phone: [phone]   Fax:  [phone]  Name: Shannon Gould MRN: [id number] Date of Birth: [birth year]

## 2020-09-13 ENCOUNTER — Ambulatory Visit: Payer: 59 | Admitting: Orthopaedic Surgery

## 2020-09-20 ENCOUNTER — Ambulatory Visit: Payer: 59 | Admitting: Orthopaedic Surgery

## 2020-09-20 ENCOUNTER — Other Ambulatory Visit: Payer: Self-pay

## 2020-09-20 ENCOUNTER — Encounter (HOSPITAL_COMMUNITY): Payer: 59 | Admitting: Occupational Therapy

## 2020-09-20 ENCOUNTER — Ambulatory Visit: Payer: 59 | Admitting: Urology

## 2020-09-20 ENCOUNTER — Encounter: Payer: Self-pay | Admitting: Orthopaedic Surgery

## 2020-09-20 VITALS — BP 144/93 | HR 92 | Ht 63.0 in | Wt 237.0 lb

## 2020-09-20 DIAGNOSIS — G8929 Other chronic pain: Secondary | ICD-10-CM | POA: Diagnosis not present

## 2020-09-20 DIAGNOSIS — R319 Hematuria, unspecified: Secondary | ICD-10-CM

## 2020-09-20 DIAGNOSIS — M25511 Pain in right shoulder: Secondary | ICD-10-CM

## 2020-09-20 NOTE — Progress Notes (Signed)
Patient WU:[id number] Shannon Gould, female DOB:09-11-1975, 45 y.o. NWG:[id number]  Chief Complaint  Patient presents with  . Shoulder Pain    Right     HPI  Shannon Gould is a 45 y.o. female who has right shoulder pain.  She has completed OT but still has pain in the right shoulder.  She is not sleeping well.  I have reviewed the OT notes.  I am concerned about rotator cuff injury.  I will get MRI of the right shoulder.  She is taking her Naprosyn.   Body mass index is 41.98 kg/m.  ROS  Review of Systems  Constitutional: Positive for activity change.  Respiratory: Positive for shortness of breath.   Musculoskeletal: Positive for arthralgias.  Psychiatric/Behavioral: The patient is nervous/anxious.   All other systems reviewed and are negative.   All other systems reviewed and are negative.  The following is a summary of the past history medically, past history surgically, known current medicines, social history and family history.  This information is gathered electronically by the computer from prior information and documentation.  I review this each visit and have found including this information at this point in the chart is beneficial and informative.    Past Medical History:  Diagnosis Date  . Anemia    hx of patient reports   . Anxiety   . Asthma   . Depression   . GERD (gastroesophageal reflux disease)   . Head ache     Past Surgical History:  Procedure Laterality Date  . CHOLECYSTECTOMY    . transvaginal mesh      Family History  Problem Relation Age of Onset  . Hypertension Mother   . Diabetes Mother   . Heart disease Father   . Sleep apnea Father   . Diabetes Father   . Heart attack Father        x2  . Heart disease Sister   . Anemia Sister   . Hypertension Sister   . Heart disease Brother   . Hypertension Brother   . Seizures Paternal Grandmother   . Anxiety disorder Daughter   . Depression Daughter   . Asthma Daughter   . Asthma Son   . Asthma  Son     Social History Social History   Tobacco Use  . Smoking status: Never Smoker  . Smokeless tobacco: Never Used  Vaping Use  . Vaping Use: Never used  Substance Use Topics  . Alcohol use: Yes    Alcohol/week: 0.0 - 1.0 standard drinks    Comment: rare  . Drug use: No    Allergies  Allergen Reactions  . Hydrocodone Itching  . Wellbutrin [Bupropion]   . Caffeine Other (See Comments)    Headache   . Lactose Intolerance (Gi) Other (See Comments)    gas    Current Outpatient Medications  Medication Sig Dispense Refill  . albuterol (VENTOLIN HFA) 108 (90 Base) MCG/ACT inhaler Inhale 2 puffs into the lungs every 6 (six) hours as needed. For shortness of breath    . ALPRAZolam (XANAX) 1 MG tablet TAKE 1/2 TO 1 TABLET BY MOUTH TWICE A DAY AS NEEDED 60 tablet 5  . ARIPiprazole (ABILIFY) 20 MG tablet TAKE 1 TABLET BY MOUTH ONCE DAILY 90 tablet 0  . b complex vitamins tablet Take 1 tablet by mouth daily. 30 tablet 11  . eletriptan (RELPAX) 40 MG tablet TAKE 1 TABLET FOR MIGRAINE. MAY REPEAT IN 2 HOURS IF HEADACHE PERSISTS OR RECURS. MAX 2 TABLETS  IN 24HRS 10 tablet 5  . Erenumab-aooe 140 MG/ML SOAJ INJECT 1 PEN (140 MG) INTO THE SKIN EVERY 28 DAYS. 1 mL 9  . furosemide (LASIX) 40 MG tablet Take 40 mg by mouth daily as needed.     Marland Kitchen levonorgestrel (MIRENA, 52 MG,) 20 MCG/24HR IUD 1 each by Intrauterine route once.     . mirtazapine (REMERON) 7.5 MG tablet Take 1-2 tablets (7.5-15 mg total) by mouth at bedtime as needed. 180 tablet 1  . naproxen (NAPROSYN) 500 MG tablet TAKE 1 TABLET BY MOUTH 2 TIMES DAILY WITH A MEAL 60 tablet 5  . phentermine (ADIPEX-P) 37.5 MG tablet TAKE 1 TABLET (37.5 MG) BY MOUTH ONCE DAILY BEFORE BREAKFAST 90 tablet 0  . venlafaxine XR (EFFEXOR-XR) 150 MG 24 hr capsule TAKE 1 CAPSULE BY MOUTH DAILY WITH BREAKFAST 90 capsule 1  . venlafaxine XR (EFFEXOR-XR) 75 MG 24 hr capsule TAKE 1 CAPSULE BY MOUTH DAILY WITH BREAKFAST. TAKE WITH 150 MG= 225 MG. 90 capsule 1   . Vitamin D, Ergocalciferol, (DRISDOL) 1.25 MG (50000 UNIT) CAPS capsule TAKE 1 CAPSULE BY MOUTH ONCE WEEKLY FOR 12 WEEKS. ONCE FINISHED TAKE 2000 UNITS OTC. 12 capsule 0  . zolpidem (AMBIEN) 10 MG tablet Take 10 mg by mouth at bedtime as needed for sleep.     No current facility-administered medications for this visit.     Physical Exam  Blood pressure (!) 144/93, pulse 92, height 5\' 3"  (1.6 m), weight 237 lb (107.5 kg).  Constitutional: overall normal hygiene, normal nutrition, well developed, normal grooming, normal body habitus. Assistive device:none  Musculoskeletal: gait and station Limp none, muscle tone and strength are normal, no tremors or atrophy is present.  .  Neurological: coordination overall normal.  Deep tendon reflex/nerve stretch intact.  Sensation normal.  Cranial nerves II-XII intact.   Skin:   Normal overall no scars, lesions, ulcers or rashes. No psoriasis.  Psychiatric: Alert and oriented x 3.  Recent memory intact, remote memory unclear.  Normal mood and affect. Well groomed.  Good eye contact.  Cardiovascular: overall no swelling, no varicosities, no edema bilaterally, normal temperatures of the legs and arms, no clubbing, cyanosis and good capillary refill.  Lymphatic: palpation is normal.  Right shoulder has good ROM but pain in the extremes.  NV intact.  All other systems reviewed and are negative   The patient has been educated about the nature of the problem(s) and counseled on treatment options.  The patient appeared to understand what I have discussed and is in agreement with it.  Encounter Diagnosis  Name Primary?  . Chronic pain in right shoulder Yes    PLAN Call if any problems.  Precautions discussed.  Continue current medications.   Return to clinic 3 weeks   Get MRI of the right shoulder.  Electronically Signed , MD 4/19/20229:51 AM

## 2020-09-26 ENCOUNTER — Ambulatory Visit (INDEPENDENT_AMBULATORY_CARE_PROVIDER_SITE_OTHER): Payer: 59 | Admitting: Psychiatry

## 2020-09-26 DIAGNOSIS — F411 Generalized anxiety disorder: Secondary | ICD-10-CM | POA: Diagnosis not present

## 2020-09-26 NOTE — Progress Notes (Signed)
[facility] Counselor/Therapist Progress Note  Patient ID: Shannon Gould, MRN: [id number],    Date: 09/26/2020  Time Spent: 60 minutes    5:00pm to 6:00pm  Virtual Visit  Note via MyChart Video: Connected with patient by a video enabled telemedicine/telehealth application or telephone, with their informed consent, and verified patient privacy and that I am speaking with the correct person using two identifiers. I discussed the limitations, risks, security and privacy concerns of performing psychotherapy and management service by telephone and the availability of in person appointments. I also discussed with the patient that there may be a patient responsible charge related to this service. The patient expressed understanding and agreed to proceed. I discussed the treatment planning with the patient. The patient was provided an opportunity to ask questions and all were answered. The patient agreed with the plan and demonstrated an understanding of the instructions. The patient was advised to call  our office if  symptoms worsen or feel they are in a crisis state and need immediate contact.   Therapist Location: [facility] Patient Location: home    Treatment Type: Individual Therapy  Reported Symptoms: anxiety (some better), depression (also better) Mental Status Exam:  Appearance:   Casual     Behavior:  Appropriate, Sharing and Motivated  Motor:  Normal  Speech/Language:   Clear and Coherent  Affect:  anxious, some depression  Mood:  anxious and depressed  Thought process:  goal directed  Thought content:    WNL  Sensory/Perceptual disturbances:    WNL  Orientation:  oriented to person, place, time/date, situation, day of week, month of year and year  Attention:  Good  Concentration:  Good  Memory:  WNL  Fund of knowledge:   Good  Insight:    Good and Fair  Judgment:   Good  Impulse Control:  Fair   Risk Assessment: Danger to Self:  No Self-injurious  Behavior: No Danger to Others: No Duty to Warn:no Physical Aggression / Violence:No  Access to Firearms a concern: No  Gang Involvement:No   Subjective: Patient today reports anxiety and depression, with improvement in both.  See Progress Note below.   Interventions: Solution-Oriented/Positive Psychology and Ego-Supportive  Diagnosis:   ICD-10-CM   1. GAD (generalized anxiety disorder)  F41.1      Plan: Patient not signing tx plan on computer screen due to COVID-19.  Treatment Goals:  Treatment goals remain on tx plan as patient works on strategies to meet her goals. Progress is noted each session in the "Progress" section on tx Plan.  Long term goal: Reduce thoughtsthat relate to her impulsive behaviors or desire to control things out of my control, and be able to make appropriate cognitive and behavioral changes.  Short term goal: Reduce the anxiety that keeps me from doing things I really want to do now and looking into the future.  Use cognitive methods to control trigger thoughts and reduce impulsive reactions to those trigger thoughts.  Strategy: Help the patient uncover dysfunctional thoughts that lead to impulsivity. Replace each thought with a thought that is accurate, positive, self-enhancing, and adaptive.   Progress: Patient today reporting anxiety and some depression although showing improvement in both.  Has started back exercising past 2-3 wks and "that has helped some." Concerns about her kids, 2 in college and 1 in 12th grade. Worries "all the time about her kids and safety, safety of myself, and all the world violence going on although I try not to  watch too much news." Worked today mostly on her worrying as it's more consuming with patient more recently.  States "I want to be in control of everything but I know I can't, that way I can control the bad but I can't."  Control is a big factor for patient, and we looked at what she can control and the things  she can't.  Also worked on countering some of her worrying and control issues with more positive thoughts and she was able to come up with several examples.  Reports that her self talk has not been quite as good lately but she is going to be focusing on it more going forward.  When she was using more positive self talk it was helping her anxiety to de-escalate some.  Worked with her short-term goal and strategy and treatment plan above of regarding more positive self talk and better management of anxious/depressive/negative thoughts or "assuming the worst".  Reviewed today some of our prior "talking back to her anxiety and not letting it incapacitate her nor feel helpless".  This seemed to be especially helpful to patient per her report.  Encouraged her continued work as we did in session today interrupting of self-doubting and increasing her belief in herself, looking more for what might go right versus wrong, setting appropriate boundaries with other people, healthy nutrition and exercise, enjoy time with her 3 children, continue to work on letting go of guilt, focus more on what she can control versus cannot, staying in the present rather than the past or future, attentionally looking for positives daily, and realizing the strengths she is showing in working with her treatment plan goals to become more healthy emotionally and move forward in a more positive direction.   Goal review and progress/challenges noted with patient.  Next appointment within 2 to 3 weeks.   Mathis Fare, LCSW

## 2020-10-03 ENCOUNTER — Ambulatory Visit (INDEPENDENT_AMBULATORY_CARE_PROVIDER_SITE_OTHER): Payer: 59 | Admitting: Urology

## 2020-10-03 ENCOUNTER — Encounter: Payer: Self-pay | Admitting: Urology

## 2020-10-03 ENCOUNTER — Other Ambulatory Visit: Payer: Self-pay

## 2020-10-03 VITALS — BP 125/74 | HR 105 | Temp 98.6°F | Ht 63.0 in | Wt 240.0 lb

## 2020-10-03 DIAGNOSIS — R310 Gross hematuria: Secondary | ICD-10-CM | POA: Diagnosis not present

## 2020-10-03 DIAGNOSIS — N3941 Urge incontinence: Secondary | ICD-10-CM

## 2020-10-03 LAB — MICROSCOPIC EXAMINATION
Epithelial Cells (non renal): >10 /hpf — AB (ref 0–10)
Renal Epithel, UA: NONE SEEN /hpf

## 2020-10-03 LAB — URINALYSIS, ROUTINE W REFLEX MICROSCOPIC
Bilirubin, UA: NEGATIVE
Glucose, UA: NEGATIVE
Ketones, UA: NEGATIVE
Nitrite, UA: NEGATIVE
Specific Gravity, UA: >1.03 — ABNORMAL HIGH (ref 1.005–1.030)
Urobilinogen, Ur: 0.2 mg/dL (ref 0.2–1.0)
pH, UA: 6.5 (ref 5.0–7.5)

## 2020-10-03 NOTE — Progress Notes (Signed)
post void residual= 5  Urological Symptom Review  Patient is experiencing the following symptoms: Hard to postpone urination Get up at night to urinate Leakage of urine Blood in urine   Review of Systems  Gastrointestinal (upper)  : Negative for upper GI symptoms  Gastrointestinal (lower) : Constipation  Constitutional : Negative for symptoms  Skin: Negative for skin symptoms  Eyes: Blurred vision  Ear/Nose/Throat : Negative for Ear/Nose/Throat symptoms  Hematologic/Lymphatic: Easy bruising  Cardiovascular : Leg swelling  Respiratory : Negative for respiratory symptoms  Endocrine: Negative for endocrine symptoms  Musculoskeletal: Negative for musculoskeletal symptoms  Neurological: Headaches  Psychologic: Depression Anxiety

## 2020-10-03 NOTE — Progress Notes (Signed)
10/03/2020 3:46 PM   Shannon Gould 1975-07-20 [id number]  Referring provider: Assunta Found, MD [address] [place],  [place] [address]  No chief complaint on file.   HPI:  New pt referred for -  1) gross hematuria  - she has seen blood she wipes p urination since about 12/21. Not every time. She did see some blood clots in the urine. She doesn't get regular periods (IUD) so she doesn't thinks it's vaginal. Non-smoker. No chemo or XRT. No chemical exposure.    UA today with no hematuria. Many bacteria.    2) urgency - she has urgency and needs to get to the bathroom. She typically voids with a good stream but sometimes not a lot when she thought she needed to go. No frequency. She feels empty.   She has significant h/o of urethral dilations and MUS with Dr. Jerre Simon yrs ago. She developed incomplete bladder emptying p MUS with elevated PVR. She saw Dr. Ashley Royalty who did urethrolysis and parital removal of the MUS 09/19. No other pelvic surgery. She has three kids and all NSVD.   PVR today 5 ml.   She is a Engineer, civil (consulting) in cardiology.    PMH: Past Medical History:  Diagnosis Date  . Anemia    hx of patient reports   . Anxiety   . Asthma   . Depression   . GERD (gastroesophageal reflux disease)   . Head ache     Surgical History: Past Surgical History:  Procedure Laterality Date  . CHOLECYSTECTOMY    . transvaginal mesh      Home Medications:  Allergies as of 10/03/2020      Reactions   Hydrocodone Itching   Wellbutrin [bupropion]    Caffeine Other (See Comments)   Headache    Lactose Intolerance (gi) Other (See Comments)   gas      Medication List       Accurate as of Oct 03, 2020  3:46 PM. If you have any questions, ask your nurse or doctor.        Aimovig 140 MG/ML Soaj Generic drug: Erenumab-aooe INJECT 1 PEN (140 MG) INTO THE SKIN EVERY 28 DAYS.   albuterol 108 (90 Base) MCG/ACT inhaler Commonly known as: VENTOLIN HFA Inhale 2 puffs into the  lungs every 6 (six) hours as needed. For shortness of breath   ALPRAZolam 1 MG tablet Commonly known as: XANAX TAKE 1/2 TO 1 TABLET BY MOUTH TWICE A DAY AS NEEDED   ARIPiprazole 20 MG tablet Commonly known as: ABILIFY TAKE 1 TABLET BY MOUTH ONCE DAILY   b complex vitamins tablet Take 1 tablet by mouth daily.   eletriptan 40 MG tablet Commonly known as: RELPAX TAKE 1 TABLET FOR MIGRAINE. MAY REPEAT IN 2 HOURS IF HEADACHE PERSISTS OR RECURS. MAX 2 TABLETS IN 24HRS   furosemide 40 MG tablet Commonly known as: LASIX Take 40 mg by mouth daily as needed.   Mirena (52 MG) 20 MCG/DAY Iud Generic drug: levonorgestrel 1 each by Intrauterine route once.   mirtazapine 7.5 MG tablet Commonly known as: REMERON Take 1-2 tablets (7.5-15 mg total) by mouth at bedtime as needed.   naproxen 500 MG tablet Commonly known as: NAPROSYN TAKE 1 TABLET BY MOUTH 2 TIMES DAILY WITH A MEAL   phentermine 37.5 MG tablet Commonly known as: ADIPEX-P TAKE 1 TABLET (37.5 MG) BY MOUTH ONCE DAILY BEFORE BREAKFAST   venlafaxine XR 75 MG 24 hr capsule Commonly known as: EFFEXOR-XR TAKE 1 CAPSULE  BY MOUTH DAILY WITH BREAKFAST. TAKE WITH 150 MG= 225 MG.   venlafaxine XR 150 MG 24 hr capsule Commonly known as: EFFEXOR-XR TAKE 1 CAPSULE BY MOUTH DAILY WITH BREAKFAST   Vitamin D (Ergocalciferol) 1.25 MG (50000 UNIT) Caps capsule Commonly known as: DRISDOL TAKE 1 CAPSULE BY MOUTH ONCE WEEKLY FOR 12 WEEKS. ONCE FINISHED TAKE 2000 UNITS OTC.   zolpidem 10 MG tablet Commonly known as: AMBIEN Take 10 mg by mouth at bedtime as needed for sleep.       Allergies:  Allergies  Allergen Reactions  . Hydrocodone Itching  . Wellbutrin [Bupropion]   . Caffeine Other (See Comments)    Headache   . Lactose Intolerance (Gi) Other (See Comments)    gas    Family History: Family History  Problem Relation Age of Onset  . Hypertension Mother   . Diabetes Mother   . Heart disease Father   . Sleep apnea Father    . Diabetes Father   . Heart attack Father        x2  . Heart disease Sister   . Anemia Sister   . Hypertension Sister   . Heart disease Brother   . Hypertension Brother   . Seizures Paternal Grandmother   . Anxiety disorder Daughter   . Depression Daughter   . Asthma Daughter   . Asthma Son   . Asthma Son     Social History:  reports that she has never smoked. She has never used smokeless tobacco. She reports current alcohol use. She reports that she does not use drugs.   Physical Exam: BP 125/74   Pulse (!) 105   Temp 98.6 F (37 C)   Ht 5\' 3"  (1.6 m)   Wt 240 lb (108.9 kg)   BMI 42.51 kg/m   Constitutional:  Alert and oriented, No acute distress. HEENT: [place] AT, moist mucus membranes.  Trachea midline, no masses. Cardiovascular: No clubbing, cyanosis, or edema. Respiratory: Normal respiratory effort, no increased work of breathing. GI: Abdomen is soft, nontender, nondistended, no abdominal masses GU: No CVA tenderness Skin: No rashes, bruises or suspicious lesions. Neurologic: Grossly intact, no focal deficits, moving all 4 extremities. Psychiatric: Normal mood and affect.  Laboratory Data: Lab Results  Component Value Date   WBC 8.3 10/31/2016   HGB 13.3 10/31/2016   HCT 40.7 10/31/2016   MCV 85.5 10/31/2016   PLT 227 10/31/2016    Lab Results  Component Value Date   CREATININE 0.83 12/04/2018    No results found for: PSA  No results found for: TESTOSTERONE  Lab Results  Component Value Date   HGBA1C 5.4 07/29/2018    Urinalysis    Component Value Date/Time   COLORURINE YELLOW 05/17/2017 1950   APPEARANCEUR HAZY (A) 05/17/2017 1950   LABSPEC 1.030 05/17/2017 1950   PHURINE 5.0 05/17/2017 1950   GLUCOSEU NEGATIVE 05/17/2017 1950   HGBUR NEGATIVE 05/17/2017 1950   BILIRUBINUR NEGATIVE 05/17/2017 1950   KETONESUR NEGATIVE 05/17/2017 1950   PROTEINUR 30 (A) 05/17/2017 1950   NITRITE NEGATIVE 05/17/2017 1950   LEUKOCYTESUR NEGATIVE 05/17/2017  1950    Lab Results  Component Value Date   BACTERIA RARE (A) 05/17/2017    Pertinent Imaging: n/a  Results for orders placed during the hospital encounter of 04/11/10  13/08/11 Renal  Narrative Clinical Data: Hematuria.  RENAL/URINARY TRACT ULTRASOUND COMPLETE  Comparison:  None.  Findings:  Right Kidney:  10.1 cm in length.  Normal sonographic appearance without evidence of hydronephrosis  or focal lesion.  No shadowing calculi identified.  Left Kidney:  10.0 cm in length.  Normal appearance without nephrosis, lesion or shadowing calculi.  Bladder:  Minimally distended during  examination.  The bladder appears potentially thick-walled.  Cannot exclude underlying bladder pathology such as cystitis.  IMPRESSION: Normal sonographic appearance of both kidneys.  Potential thick- walled bladder.  Underlying bladder pathology cannot be excluded such as cystitis.  Provider: Dondra Prader  No results found for this or any previous visit.  No results found for this or any previous visit.  No results found for this or any previous visit.   Assessment & Plan:    1. Gross hematuria Check renal US and cystoscopy  - Urinalysis, Routine w reflex microscopic - BLADDER SCAN AMB NON-IMAGING  2. Urgency - eval as above. Consider PT or OAB meds. She tried kegels.     No follow-ups on file.  Jerilee Field, MD

## 2020-10-04 ENCOUNTER — Ambulatory Visit
Admission: RE | Admit: 2020-10-04 | Discharge: 2020-10-04 | Disposition: A | Discharge status: Home / Self Care | Payer: 59 | Source: Ambulatory Visit | Attending: Orthopaedic Surgery | Admitting: Orthopaedic Surgery

## 2020-10-04 DIAGNOSIS — S46011A Strain of muscle(s) and tendon(s) of the rotator cuff of right shoulder, initial encounter: Secondary | ICD-10-CM | POA: Diagnosis not present

## 2020-10-04 DIAGNOSIS — G8929 Other chronic pain: Secondary | ICD-10-CM

## 2020-10-06 ENCOUNTER — Other Ambulatory Visit (HOSPITAL_COMMUNITY): Payer: Self-pay

## 2020-10-06 MED FILL — Erenumab-aooe Subcutaneous Soln Auto-Injector 140 MG/ML: SUBCUTANEOUS | 30 days supply | Qty: 1 | Fill #0 | Status: AC

## 2020-10-07 ENCOUNTER — Encounter: Payer: Self-pay | Admitting: Orthopaedic Surgery

## 2020-10-10 NOTE — Progress Notes (Unsigned)
   Subjective:    Patient ID: Shannon Gould, female    DOB: 07/15/1975, 45 y.o.   MRN: [id number]  HPI    Review of Systems     Objective:   Physical Exam        Assessment & Plan:

## 2020-10-10 NOTE — Telephone Encounter (Signed)
Do you want to go over MRI with her in person or discuss it over the phone  Please advise

## 2020-10-11 ENCOUNTER — Encounter: Payer: Self-pay | Admitting: Orthopaedic Surgery

## 2020-10-11 ENCOUNTER — Other Ambulatory Visit: Payer: Self-pay

## 2020-10-11 ENCOUNTER — Ambulatory Visit (HOSPITAL_COMMUNITY): Payer: 59

## 2020-10-11 ENCOUNTER — Ambulatory Visit: Payer: 59 | Admitting: Orthopaedic Surgery

## 2020-10-11 VITALS — BP 135/78 | HR 89 | Ht 63.0 in | Wt 240.0 lb

## 2020-10-11 DIAGNOSIS — M7551 Bursitis of right shoulder: Secondary | ICD-10-CM | POA: Diagnosis not present

## 2020-10-11 DIAGNOSIS — G43909 Migraine, unspecified, not intractable, without status migrainosus: Secondary | ICD-10-CM | POA: Insufficient documentation

## 2020-10-11 DIAGNOSIS — M25511 Pain in right shoulder: Secondary | ICD-10-CM

## 2020-10-11 DIAGNOSIS — M67813 Other specified disorders of tendon, right shoulder: Secondary | ICD-10-CM | POA: Diagnosis not present

## 2020-10-11 DIAGNOSIS — G8929 Other chronic pain: Secondary | ICD-10-CM

## 2020-10-11 NOTE — Progress Notes (Signed)
My shoulder still hurts at times.    She had MRI of the right shoulder and it showed: IMPRESSION: 1. Moderate distal supraspinatus tendinosis with small intrasubstance tear at the insertion. 2. Moderate distal infraspinatus tendinosis. 3. Mild subacromial/subdeltoid bursitis.  I have explained the findings to her.  She says she is tired of it hurting.  I would not recommend surgery at this time.  I will have her see Dr. Dallas Schimke to get his opinion.  She has been on Naprosyn, been to OT and done exercises, does the exercises at home.  She wants to know what else could be done. She is very active.  I have independently reviewed the MRI.     To see Dr. Dallas Schimke.  Call if any problem.  Precautions discussed.   Electronically Signed Darreld Mclean, MD 5/10/20223:19 PM

## 2020-10-12 ENCOUNTER — Ambulatory Visit (INDEPENDENT_AMBULATORY_CARE_PROVIDER_SITE_OTHER): Payer: 59 | Admitting: Psychiatry

## 2020-10-12 DIAGNOSIS — F411 Generalized anxiety disorder: Secondary | ICD-10-CM

## 2020-10-12 NOTE — Progress Notes (Signed)
[facility] Counselor/Therapist Progress Note  Patient ID: GLADINE PLUDE, MRN: [id number],    Date: 10/12/2020  Time Spent: 60 minutes   5:00pm to 6:00pm   Virtual Visit via MyChart VIDEO Note Connected with patient by a video enabled telemedicine/telehealth application or telephone, with their informed consent, and verified patient privacy and that I am speaking with the correct person using two identifiers. I discussed the limitations, risks, security and privacy concerns of performing psychotherapy and management service by telephone and the availability of in person appointments. I also discussed with the patient that there may be a patient responsible charge related to this service. The patient expressed understanding and agreed to proceed. I discussed the treatment planning with the patient. The patient was provided an opportunity to ask questions and all were answered. The patient agreed with the plan and demonstrated an understanding of the instructions. The patient was advised to call  our office if  symptoms worsen or feel they are in a crisis state and need immediate contact.   Therapist Location: [facility] Patient Location: home   Treatment Type: Individual Therapy  Reported Symptoms: anxiety  Mental Status Exam:  Appearance:   Casual     Behavior:  Appropriate, Sharing and Motivated  Motor:  Normal  Speech/Language:   Clear and Coherent  Affect:  anxiety  Mood:  anxious  Thought process:  goal directed  Thought content:    WNL  Sensory/Perceptual disturbances:    WNL  Orientation:  oriented to person, place, time/date, situation, day of week, month of year and year  Attention:  Good  Concentration:  Good  Memory:  WNL  Fund of knowledge:   Good  Insight:    Good  Judgment:   Good  Impulse Control:  Good   Risk Assessment: Danger to Self:  No Self-injurious Behavior: No Danger to Others: No Duty to Warn:no Physical Aggression /  Violence:No  Access to Firearms a concern: No  Gang Involvement:No   Subjective: Patient today reports anxiety as her main symptom and has heightened some recently due to family stressor and a shoulder injury.  Trying to "think more positively."  See Progress Note below.    Interventions: Solution-Oriented/Positive Psychology and Ego-Supportive  Diagnosis:   ICD-10-CM   1. GAD (generalized anxiety disorder)  F41.1      Plan: Patient not signing tx plan on computer screen due to COVID-19.  Treatment Goals:  Treatment goals remain on tx plan as patient works on strategies to meet her goals. Progress is noted each session in the "Progress" section on tx Plan.  Long term goal: Reduce thoughtsthat relate to her impulsive behaviors or desire to control things out of my control, and be able to make appropriate cognitive and behavioral changes.  Short term goal: Reduce the anxiety that keeps me from doing things I really want to do now and looking into the future. Use cognitive methods to control trigger thoughts and reduce impulsive reactions to those trigger thoughts.  Strategy: Help the patient uncover dysfunctional thoughts that lead to impulsivity. Replace each thought with a thought that is accurate, positive, self-enhancing, and adaptive.   Progress: Patient today reporting anxiety heightened due to family stressors and shoulder injury.  Has a tear in shoulder and to see shoulder specialist May 20th. Concerns for her kids, 2 in college and 1 in high school and "worries about bad things happening to them."  Concerned about her shoulder tear and whether she may need  surgery. Anxious/fearful thoughts "affect my concentration, I try to talk myself out of being so anxious, and sometimes it works but hard to hang on to."  Shoulder injury is keeping her from exercise which had previously helped her some with anxiety. Lots of self-doubt at home, work, and "just within myself and how  I feel about myself especially when I compare myself to others."  "I feel like everyone else's positives shine and I feel like my weaknesses shine."  States she wants to be able to control everything but knows she cannot and that is a struggle.Has remained free of impulsive behaviors since last appt and that is progress.  Using her long and short-term goals and treatment plan above, we worked with her anxious/negative thought patterns using specific examples from patient, and replaced those anxious/negative thoughts with more realistic, and empowering thoughts that do not feed her anxiety/negativity/stress.  Patient is to continue working on this between sessions daily.  Also using a strategy she had found helpful before of "talking back to her anxiety and not letting it incapacitate her nor lead her to feel helpless".  Patient admits she did not continue practicing it but will be doing so between sessions now.  Also encouraged patient to work on interrupting her self doubt and increase her belief in herself as evidenced by some of the accomplishments we named today during session, healthy nutrition and exercise, enjoy time with her 3 children as she is able when they are home, focus more on what might go right versus wrong, set appropriate boundaries with other people as needed, focus more on the present and what she can control, intentionally look for positives each day, be very aware of negative self talk and stop it and then replace it with something more positive and affirming, and feel good about the strength she is showing in the midst of difficult life circumstances as she works with goal-directed behaviors to move forward in a more positive direction and be more emotionally healthy.   Review and progress/challenges noted with patient.  Next appointment within 2 to 3 weeks.   Mathis Fare, LCSW

## 2020-10-14 ENCOUNTER — Ambulatory Visit (HOSPITAL_COMMUNITY)
Admission: RE | Admit: 2020-10-14 | Discharge: 2020-10-14 | Disposition: A | Discharge status: Home / Self Care | Payer: 59 | Source: Ambulatory Visit | Attending: Urology | Admitting: Urology

## 2020-10-14 DIAGNOSIS — R310 Gross hematuria: Secondary | ICD-10-CM | POA: Diagnosis not present

## 2020-10-14 DIAGNOSIS — N281 Cyst of kidney, acquired: Secondary | ICD-10-CM | POA: Diagnosis not present

## 2020-10-20 ENCOUNTER — Ambulatory Visit: Payer: 59 | Admitting: Orthopaedic Surgery

## 2020-10-21 ENCOUNTER — Ambulatory Visit: Payer: 59 | Admitting: Orthopedic Surgery

## 2020-10-26 NOTE — Progress Notes (Unsigned)
CoverMyMeds Your Preferences Verify Prescribers Help Privacy & Terms Logged in as [employer] Out Send to Plan Send to Prescriber Save Archive Other Actions Download / Print Renew Delete Notes less than a minute ago View by you less than a minute ago Question Set Received - N/A less than a minute ago View by you less than a minute ago Question Set Requested less than a minute ago Save - ePA Send to Plan by you Older Add note   Set reminder  1 business day  Add CoverMyMeds has established a business relationship with the Pharmacologist that results in a differentiated user experience on CoverMyMeds for the PA process, and may include patient support services. There may be lower cost medications available or preferred on your patient's plan. [name] (Key: [id number]) Aimovig 70MG /ML auto-injectors   Form MedImpact ePA Form 2017 NCPDP Created 23 hours ago Sent to Plan less than a minute ago Plan Response less than a minute ago Submit Clinical Questions Determination N/A Message from Plan Member has an active PA on file which is expiring on 03/07/2021 and has 7 no. of fills remaining.

## 2020-10-28 ENCOUNTER — Ambulatory Visit: Payer: 59 | Admitting: Orthopedic Surgery

## 2020-10-28 ENCOUNTER — Encounter: Payer: Self-pay | Admitting: Physician Assistant

## 2020-10-28 ENCOUNTER — Telehealth (INDEPENDENT_AMBULATORY_CARE_PROVIDER_SITE_OTHER): Payer: 59 | Admitting: Physician Assistant

## 2020-10-28 ENCOUNTER — Encounter: Payer: Self-pay | Admitting: Orthopedic Surgery

## 2020-10-28 ENCOUNTER — Other Ambulatory Visit (HOSPITAL_COMMUNITY): Payer: Self-pay

## 2020-10-28 VITALS — BP 130/83 | HR 87 | Ht 63.0 in | Wt 236.0 lb

## 2020-10-28 DIAGNOSIS — F319 Bipolar disorder, unspecified: Secondary | ICD-10-CM

## 2020-10-28 DIAGNOSIS — Z6841 Body Mass Index (BMI) 40.0 and over, adult: Secondary | ICD-10-CM | POA: Diagnosis not present

## 2020-10-28 DIAGNOSIS — M7581 Other shoulder lesions, right shoulder: Secondary | ICD-10-CM | POA: Diagnosis not present

## 2020-10-28 DIAGNOSIS — F411 Generalized anxiety disorder: Secondary | ICD-10-CM | POA: Diagnosis not present

## 2020-10-28 DIAGNOSIS — G4700 Insomnia, unspecified: Secondary | ICD-10-CM | POA: Diagnosis not present

## 2020-10-28 MED ORDER — ZOLPIDEM TARTRATE 10 MG PO TABS
10.0000 mg | ORAL_TABLET | Freq: Every evening | ORAL | 5 refills | Status: DC | PRN
  Filled 2020-10-28: qty 30, 30d supply, fill #0

## 2020-10-28 MED ORDER — VENLAFAXINE HCL ER 75 MG PO CP24
ORAL_CAPSULE | ORAL | 1 refills | Status: DC
  Filled 2020-10-28 – 2020-12-16 (×2): qty 90, 90d supply, fill #0
  Filled 2021-03-28: qty 90, 90d supply, fill #1

## 2020-10-28 MED ORDER — VENLAFAXINE HCL ER 150 MG PO CP24
ORAL_CAPSULE | Freq: Every day | ORAL | 1 refills | Status: DC
  Filled 2020-10-28 – 2020-12-16 (×2): qty 90, 90d supply, fill #0
  Filled 2021-03-28: qty 90, 90d supply, fill #1

## 2020-10-28 MED ORDER — ARIPIPRAZOLE 20 MG PO TABS
ORAL_TABLET | Freq: Every day | ORAL | 1 refills | Status: DC
  Filled 2020-10-28: qty 90, 90d supply, fill #0
  Filled 2021-02-08: qty 90, 90d supply, fill #1

## 2020-10-28 NOTE — Progress Notes (Addendum)
New Patient Visit  Assessment: Shannon Gould is a 45 y.o. RHD female with the following: Right shoulder rotator cuff tendinitis; restricted range of motion with component of adhesive capsulitis  Plan: Reviewed radiographs and the MRI with the patient in clinic today.  She does have moderate amount of irritation of the supraspinatus and infraspinatus, with a small, partial injury to the supraspinatus tendon.  I do not think that a shoulder arthroscopy would benefit her.  However, I think the biggest contributor to her ongoing pain is the stiffness on physical exam.  In particular, she has limited range of motion of the right shoulder with her arm abducted 90 degrees at her side.  She is very limited in this position, especially compared to the contralateral side.  She has been taking appropriate medications, and working with physical therapy.  However, I do think she will benefit from an ultrasound-guided steroid injection, most likely a high-volume steroid injection.  This was discussed with the patient and she is in agreement with this plan.  We will refer her to Dr. Aviva Signs for further evaluation, and an appropriate injection.  If she continues to have issues with her right shoulder, I have asked her to contact the clinic in follow-up.  The patient meets the AMA guidelines for Morbid obesity with BMI > 40.  The patient has been counseled on weight loss.     Follow-up: Return for Referral to Dr. Melvia Heaps.  Subjective:  Chief Complaint  Patient presents with  . Shoulder Pain    Rt shoulder pain for 4-5 months, NKI     History of Present Illness: Shannon Gould is a 45 y.o. female who presents for evaluation of her right shoulder.  She has been evaluated thus far by my partner, Dr. Hilda Lias.  She has no known injury, but her right shoulder started to bother her shortly after New Year's.  Pain is primarily located in the anterior lateral aspect of her shoulder.  There is  some radiating pains distally.  She has noticed some limited range of motion.  Her biggest complaint at this time is pain.  She is been taking naproxen, with limited relief.  She has worked with physical therapy, once again without sustained relief.  She is not had any injections   Review of Systems: No fevers or chills No numbness or tingling No chest pain No shortness of breath No bowel or bladder dysfunction No GI distress No headaches   Medical History:  Past Medical History:  Diagnosis Date  . Anemia    hx of patient reports   . Anxiety   . Asthma   . Depression   . GERD (gastroesophageal reflux disease)   . Head ache     Past Surgical History:  Procedure Laterality Date  . CHOLECYSTECTOMY    . transvaginal mesh      Family History  Problem Relation Age of Onset  . Hypertension Mother   . Diabetes Mother   . Heart disease Father   . Sleep apnea Father   . Diabetes Father   . Heart attack Father        x2  . Heart disease Sister   . Anemia Sister   . Hypertension Sister   . Heart disease Brother   . Hypertension Brother   . Seizures Paternal Grandmother   . Anxiety disorder Daughter   . Depression Daughter   . Asthma Daughter   . Asthma Son   . Asthma Son  Social History   Tobacco Use  . Smoking status: Never Smoker  . Smokeless tobacco: Never Used  Vaping Use  . Vaping Use: Never used  Substance Use Topics  . Alcohol use: Yes    Alcohol/week: 0.0 - 1.0 standard drinks    Comment: rare  . Drug use: No    Allergies  Allergen Reactions  . Hydrocodone Itching  . Wellbutrin [Bupropion]   . Caffeine Other (See Comments)    Headache   . Lactose Intolerance (Gi) Other (See Comments)    gas    Current Meds  Medication Sig  . albuterol (VENTOLIN HFA) 108 (90 Base) MCG/ACT inhaler Inhale 2 puffs into the lungs every 6 (six) hours as needed. For shortness of breath  . ALPRAZolam (XANAX) 1 MG tablet TAKE 1/2 TO 1 TABLET BY MOUTH TWICE A DAY AS  NEEDED  . ARIPiprazole (ABILIFY) 20 MG tablet TAKE 1 TABLET BY MOUTH ONCE DAILY  . b complex vitamins tablet Take 1 tablet by mouth daily.  Marland Kitchen eletriptan (RELPAX) 40 MG tablet TAKE 1 TABLET FOR MIGRAINE. MAY REPEAT IN 2 HOURS IF HEADACHE PERSISTS OR RECURS. MAX 2 TABLETS IN 24HRS  . Erenumab-aooe 140 MG/ML SOAJ INJECT 1 PEN (140 MG) INTO THE SKIN EVERY 28 DAYS.  . furosemide (LASIX) 40 MG tablet Take 40 mg by mouth daily as needed.   Marland Kitchen levonorgestrel (MIRENA, 52 MG,) 20 MCG/24HR IUD 1 each by Intrauterine route once.   . mirtazapine (REMERON) 7.5 MG tablet Take 1-2 tablets (7.5-15 mg total) by mouth at bedtime as needed.  . naproxen (NAPROSYN) 500 MG tablet TAKE 1 TABLET BY MOUTH 2 TIMES DAILY WITH A MEAL  . Omega-3 Fatty Acids (FISH OIL PO) Take by mouth.  . phentermine (ADIPEX-P) 37.5 MG tablet TAKE 1 TABLET (37.5 MG) BY MOUTH ONCE DAILY BEFORE BREAKFAST  . venlafaxine XR (EFFEXOR-XR) 150 MG 24 hr capsule TAKE 1 CAPSULE BY MOUTH DAILY WITH BREAKFAST  . venlafaxine XR (EFFEXOR-XR) 75 MG 24 hr capsule TAKE 1 CAPSULE BY MOUTH DAILY WITH BREAKFAST. TAKE WITH 150 MG TO EQUAL A DOSE OF  225 MG.  . Vitamin D, Ergocalciferol, (DRISDOL) 1.25 MG (50000 UNIT) CAPS capsule TAKE 1 CAPSULE BY MOUTH ONCE WEEKLY FOR 12 WEEKS. ONCE FINISHED TAKE 2000 UNITS OTC.  Marland Kitchen zolpidem (AMBIEN) 10 MG tablet Take 1 tablet (10 mg total) by mouth at bedtime as needed for sleep.    Objective: BP 130/83   Pulse 87   Ht 5\' 3"  (1.6 m)   Wt 236 lb (107 kg)   BMI 41.81 kg/m   Physical Exam:  General: Alert and oriented.  No acute distress. Gait: Normal  Evaluation of the right shoulder demonstrates no deformity.  No atrophy is appreciated.  She has some tenderness to palpation over the anterior lateral aspect of her shoulder.  Forward flexion with some discomfort 250 degrees.  External rotation at her side is 45 degrees, the same as the contralateral side.  Negative belly press.  No pain with infraspinatus testing.  Mild  discomfort and empty can testing position.  Supraspinatus, infraspinatus and subscapularis strength is all 5/5.  Limited abduction at her side to approximately 75 degrees.  Painful in this position.  When she is fully abducted, she has limited external and internal rotation.  On the contralateral side, she easily abducts her shoulder to 90 degrees, with external rotation to 90 degrees, and internal rotation is 75 degrees.  Negative O'Brien's.  Positive speeds in supination position.  IMAGING: I personally reviewed images previously obtained in clinic   X-rays of the right shoulder are normal.  MRI of the right shoulder demonstrates tendinosis of the supra spinatus and infraspinatus tendon.  There is a small, intrasubstance tear of the anterior aspect of the supraspinatus.  Minimal fluid around the biceps tendon.   New Medications:  No orders of the defined types were placed in this encounter.     Oliver Barre, MD  10/28/2020 11:39 AM

## 2020-10-28 NOTE — Progress Notes (Signed)
[facility]  Patient ID: Shannon Gould,  MRN: [id number]  PCP: Assunta Found, MD  Date of Evaluation: 10/28/2020 Time spent:30 minutes  Chief Complaint:  Chief Complaint    Anxiety; Depression; Insomnia; Follow-up      Virtual Visit via Telehealth  I connected with patient by a video enabled telemedicine application with their informed consent, and verified patient privacy and that I am speaking with the correct person using two identifiers.  I am private, in my office and the patient is at home.  I discussed the limitations, risks, security and privacy concerns of performing an evaluation and management service by video and the availability of in person appointments. I also discussed with the patient that there may be a patient responsible charge related to this service. The patient expressed understanding and agreed to proceed.   I discussed the assessment and treatment plan with the patient. The patient was provided an opportunity to ask questions and all were answered. The patient agreed with the plan and demonstrated an understanding of the instructions.   The patient was advised to call back or seek an in-person evaluation if the symptoms worsen or if the condition fails to improve as anticipated.  I provided 30 minutes of non-face-to-face time during this encounter.  HISTORY/CURRENT STATUS: HPI for routine 65-month med check.  Patient states she is doing really well.  "I feel good."  She is able to enjoy things.  Energy and motivation are good.  Not isolating.  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  She sleeps much better than she used to.  Sometimes she will fall asleep without even taking her medication.  But there are other nights that she has to have mirtazapine plus Ambien.  Does not cry easily.  Denies suicidal or homicidal thoughts.  Still gets anxious occasionally and has to have the Xanax.  She takes it pretty rarely though.  Is still using a prescription that  she had filled in November.  Those refills will expire tomorrow.  When she does have anxiety it is usually triggered by something.  Patient denies increased energy with decreased need for sleep, no increased talkativeness, no racing thoughts, no impulsivity or risky behaviors, no increased spending, no increased libido, no grandiosity, no increased irritability or anger, and no hallucinations.  Denies dizziness, syncope, seizures, numbness, tingling, tremor, tics, unsteady gait, slurred speech, confusion. Denies unexplained weight loss, frequent infections, or sores that heal slowly.  No polyphagia, polydipsia, or polyuria. Denies visual changes or paresthesias.   Individual Medical History/ Review of Systems: Changes? :Yes  Has a pulled muscle in her shoulder.  She is seeing her orthopedist later today to discuss options for treatment.  Past medications for mental health diagnoses include: Effexor, Vistaril, Xanax, Ambien, Zoloft, Wellbutrin which caused increased agitation and suicidal ideations, trazodone, Abilify, Sonata  Allergies: Hydrocodone, Wellbutrin [bupropion], Caffeine, and Lactose intolerance (gi)  Current Medications:  Current Outpatient Medications:  .  albuterol (VENTOLIN HFA) 108 (90 Base) MCG/ACT inhaler, Inhale 2 puffs into the lungs every 6 (six) hours as needed. For shortness of breath, Disp: , Rfl:  .  ALPRAZolam (XANAX) 1 MG tablet, TAKE 1/2 TO 1 TABLET BY MOUTH TWICE A DAY AS NEEDED, Disp: 60 tablet, Rfl: 5 .  b complex vitamins tablet, Take 1 tablet by mouth daily., Disp: 30 tablet, Rfl: 11 .  eletriptan (RELPAX) 40 MG tablet, TAKE 1 TABLET FOR MIGRAINE. MAY REPEAT IN 2 HOURS IF HEADACHE PERSISTS OR RECURS. MAX 2 TABLETS IN  24HRS, Disp: 10 tablet, Rfl: 5 .  Erenumab-aooe 140 MG/ML SOAJ, INJECT 1 PEN (140 MG) INTO THE SKIN EVERY 28 DAYS., Disp: 1 mL, Rfl: 9 .  furosemide (LASIX) 40 MG tablet, Take 40 mg by mouth daily as needed. , Disp: , Rfl:  .  levonorgestrel  (MIRENA, 52 MG,) 20 MCG/24HR IUD, 1 each by Intrauterine route once. , Disp: , Rfl:  .  mirtazapine (REMERON) 7.5 MG tablet, Take 1-2 tablets (7.5-15 mg total) by mouth at bedtime as needed., Disp: 180 tablet, Rfl: 1 .  naproxen (NAPROSYN) 500 MG tablet, TAKE 1 TABLET BY MOUTH 2 TIMES DAILY WITH A MEAL, Disp: 60 tablet, Rfl: 5 .  phentermine (ADIPEX-P) 37.5 MG tablet, TAKE 1 TABLET (37.5 MG) BY MOUTH ONCE DAILY BEFORE BREAKFAST, Disp: 90 tablet, Rfl: 0 .  Vitamin D, Ergocalciferol, (DRISDOL) 1.25 MG (50000 UNIT) CAPS capsule, TAKE 1 CAPSULE BY MOUTH ONCE WEEKLY FOR 12 WEEKS. ONCE FINISHED TAKE 2000 UNITS OTC., Disp: 12 capsule, Rfl: 0 .  ARIPiprazole (ABILIFY) 20 MG tablet, TAKE 1 TABLET BY MOUTH ONCE DAILY, Disp: 90 tablet, Rfl: 1 .  venlafaxine XR (EFFEXOR-XR) 150 MG 24 hr capsule, TAKE 1 CAPSULE BY MOUTH DAILY WITH BREAKFAST, Disp: 90 capsule, Rfl: 1 .  venlafaxine XR (EFFEXOR-XR) 75 MG 24 hr capsule, TAKE 1 CAPSULE BY MOUTH DAILY WITH BREAKFAST. TAKE WITH 150 MG= 225 MG., Disp: 90 capsule, Rfl: 1 .  zolpidem (AMBIEN) 10 MG tablet, Take 1 tablet (10 mg total) by mouth at bedtime as needed for sleep., Disp: 30 tablet, Rfl: 5 Medication Side Effects: none  Family Medical/ Social History: Changes?  Her daughter, youngest of 3 the other 2 children are boys, is graduating from high school this year and plans to go to [employer].  MENTAL HEALTH EXAM:  There were no vitals taken for this visit.There is no height or weight on file to calculate BMI.  General Appearance: Casual and Well Groomed  Eye Contact:  Good  Speech:  Clear and Coherent and Normal Rate  Volume:  Normal  Mood:  Euthymic  Affect:  Appropriate  Thought Process:  Goal Directed and Descriptions of Associations: Circumstantial  Orientation:  Full (Time, Place, and Person)  Thought Content: Logical   Suicidal Thoughts:  No  Homicidal Thoughts:  No  Memory:  WNL  Judgement:  Good  Insight:  Good  Psychomotor Activity:  Normal   Concentration:  Concentration: Good  Recall:  Good  Fund of Knowledge: Good  Language: Good  Assets:  Desire for Improvement  ADL's:  Intact  Cognition: WNL  Prognosis:  Good    DIAGNOSES:    ICD-10-CM   1. Bipolar I disorder (HCC)  F31.9   2. GAD (generalized anxiety disorder)  F41.1   3. Insomnia, unspecified type  G47.00     Receiving Psychotherapy: No    RECOMMENDATIONS:  PDMP was reviewed. I provided 30 minutes of non face-to-face time during this encounter, including time spent before and after the visit in records review, medical decision making, and charting. She had labs recently with her PCP so she will have those faxed to me. I am glad to see her doing so well!  No changes in treatment are necessary. Continue Xanax 1 mg, 1/2-1 p.o. twice daily as needed. Continue Abilify 20 mg, 1 p.o. every morning. Continue mirtazapine 7.5 mg, 1-2 nightly as needed sleep. Continue Effexor XR 150 mg +75 mg= 225 mg p.o. daily. Continue Ambien 10 mg, 1 nightly as needed  sleep. Return in 6 months.  Melony Overly, PA-C

## 2020-11-01 ENCOUNTER — Other Ambulatory Visit (HOSPITAL_COMMUNITY): Payer: Self-pay

## 2020-11-04 ENCOUNTER — Other Ambulatory Visit (HOSPITAL_COMMUNITY): Payer: Self-pay

## 2020-11-04 MED FILL — Naproxen Tab 500 MG: ORAL | 30 days supply | Qty: 60 | Fill #0 | Status: AC

## 2020-11-04 MED FILL — Erenumab-aooe Subcutaneous Soln Auto-Injector 140 MG/ML: SUBCUTANEOUS | 28 days supply | Qty: 1 | Fill #1 | Status: AC

## 2020-11-21 ENCOUNTER — Other Ambulatory Visit: Payer: 59 | Admitting: Urology

## 2020-11-30 ENCOUNTER — Other Ambulatory Visit (HOSPITAL_COMMUNITY): Payer: Self-pay

## 2020-12-07 ENCOUNTER — Other Ambulatory Visit (HOSPITAL_COMMUNITY): Payer: Self-pay

## 2020-12-07 DIAGNOSIS — M25511 Pain in right shoulder: Secondary | ICD-10-CM | POA: Diagnosis not present

## 2020-12-07 MED FILL — Erenumab-aooe Subcutaneous Soln Auto-Injector 140 MG/ML: SUBCUTANEOUS | 28 days supply | Qty: 1 | Fill #2 | Status: AC

## 2020-12-16 ENCOUNTER — Other Ambulatory Visit (HOSPITAL_COMMUNITY): Payer: Self-pay

## 2021-01-09 ENCOUNTER — Other Ambulatory Visit (HOSPITAL_COMMUNITY): Payer: Self-pay

## 2021-01-09 MED FILL — Erenumab-aooe Subcutaneous Soln Auto-Injector 140 MG/ML: SUBCUTANEOUS | 28 days supply | Qty: 1 | Fill #3 | Status: AC

## 2021-01-09 MED FILL — Eletriptan Hydrobromide Tab 40 MG (Base Equivalent): ORAL | 30 days supply | Qty: 10 | Fill #0 | Status: CN

## 2021-01-16 ENCOUNTER — Ambulatory Visit (INDEPENDENT_AMBULATORY_CARE_PROVIDER_SITE_OTHER): Payer: 59 | Admitting: Urology

## 2021-01-16 ENCOUNTER — Other Ambulatory Visit: Payer: Self-pay

## 2021-01-16 DIAGNOSIS — R310 Gross hematuria: Secondary | ICD-10-CM | POA: Diagnosis not present

## 2021-01-16 DIAGNOSIS — N3941 Urge incontinence: Secondary | ICD-10-CM

## 2021-01-16 LAB — URINALYSIS, ROUTINE W REFLEX MICROSCOPIC
Bilirubin, UA: NEGATIVE
Glucose, UA: NEGATIVE
Ketones, UA: NEGATIVE
Leukocytes,UA: NEGATIVE
Nitrite, UA: NEGATIVE
RBC, UA: NEGATIVE
Specific Gravity, UA: 1.025 (ref 1.005–1.030)
Urobilinogen, Ur: 0.2 mg/dL (ref 0.2–1.0)
pH, UA: 7 (ref 5.0–7.5)

## 2021-01-16 LAB — MICROSCOPIC EXAMINATION
Bacteria, UA: NONE SEEN
Epithelial Cells (non renal): >10 /hpf — AB (ref 0–10)
RBC, Urine: NONE SEEN /hpf (ref 0–2)
Renal Epithel, UA: NONE SEEN /hpf
WBC, UA: NONE SEEN /hpf (ref 0–5)

## 2021-01-16 MED ORDER — CIPROFLOXACIN HCL 500 MG PO TABS
500.0000 mg | ORAL_TABLET | Freq: Once | ORAL | Status: AC
  Administered 2021-01-16: 500 mg via ORAL

## 2021-01-16 NOTE — Progress Notes (Signed)
Urological Symptom Review  Patient is experiencing the following symptoms: Frequent urination Hard to postpone urination Get up at night to urinate Leakage of urine   Review of Systems  Gastrointestinal (upper)  : Negative for upper GI symptoms  Gastrointestinal (lower) : Negative for lower GI symptoms  Constitutional : Negative for symptoms  Skin: Negative for skin symptoms  Eyes: Negative for eye symptoms  Ear/Nose/Throat : Negative for Ear/Nose/Throat symptoms  Hematologic/Lymphatic: Easy bruising  Cardiovascular : Leg swelling  Respiratory : Negative for respiratory symptoms  Endocrine: Negative for endocrine symptoms  Musculoskeletal: Negative for musculoskeletal symptoms  Neurological: Headaches  Psychologic: Depression Anxiety

## 2021-01-16 NOTE — Progress Notes (Signed)
   01/16/21  CC: No chief complaint on file.   HPI:  F/u -     1) gross hematuria  - she has seen blood when she wipes p urination since about 12/21. Not every time. She did see some blood clots in the urine. She doesn't get regular periods (IUD) so she doesn't thinks it's vaginal. Non-smoker. No chemo or XRT. No chemical exposure. UA in office without hematuria.    05/22 Renal US benign with 3 cm left renal cyst (previously measured 18 mm on 2011 renal US).      2) urgency - she has urgency and needs to get to the bathroom. She typically voids with a good stream but sometimes not a lot when she thought she needed to go. No frequency. She feels empty. Mild incontinence and doesn't require pad use.    She has significant h/o of urethral dilations and MUS with Dr. Jerre Simon yrs ago. She developed incomplete bladder emptying p MUS with elevated PVR. She saw Dr. Ashley Royalty who did urethrolysis and parital removal of the MUS 09/19. No other pelvic surgery. She has three kids and all NSVD. PVR was 5 ml.     She is a Engineer, civil (consulting) in cardiology.    There were no vitals taken for this visit. NED. A&Ox3.   No respiratory distress   Abd soft, NT, ND Normal external genitalia with patent urethral meatus No sling material visible or palpable Urethra palpably normal, bladder palpably normal, lateral sulci palpably normal She has urethral hypermobility when she coughs but no visible leakage at 175 ml.  Chaperone: Amanda   Cystoscopy Procedure Note  Patient identification was confirmed, informed consent was obtained, and patient was prepped using Betadine solution.  Lidocaine jelly was administered per urethral meatus.    Procedure: - Flexible cystoscope introduced, without any difficulty.   - Thorough search of the bladder revealed:    normal urethral meatus    Normal urethra-no evidence of sling material or diverticulum    normal urothelium    no stones    no ulcers     no tumors    no urethral  polyps    no trabeculation  - Ureteral orifices were normal in position and appearance.  Post-Procedure: - Patient tolerated the procedure well  Assessment/ Plan:  Hematuria-benign evaluation.  We went over the left renal cyst.  We might consider imaging of the kidneys again in a few years.   Urgency-discussed proper fluid intake.  Nature risk and benefits of physical therapy or medication.  All questions answered.  She is not particularly bothered and will continue surveillance.   No follow-ups on file.  Jerilee Field, MD

## 2021-01-17 ENCOUNTER — Other Ambulatory Visit (HOSPITAL_COMMUNITY): Payer: Self-pay

## 2021-01-24 ENCOUNTER — Other Ambulatory Visit (HOSPITAL_COMMUNITY): Payer: Self-pay

## 2021-01-26 ENCOUNTER — Telehealth: Payer: Self-pay

## 2021-01-26 NOTE — Telephone Encounter (Signed)
New message    Pending   Corazon Cushman Key: [id number] - PA Case ID: [id number] help? Call us at [phone]  Outcome N/Atoday  A clinical prior authorization is not required at this time.Please contact [employer] at [phone] for additional assistance.  Drug Eletriptan Hydrobromide 40MG  tablets  Form [employer] ePA Form 2017 NCPDP

## 2021-01-30 ENCOUNTER — Other Ambulatory Visit: Payer: Self-pay | Admitting: Neurology

## 2021-01-30 ENCOUNTER — Other Ambulatory Visit (HOSPITAL_COMMUNITY): Payer: Self-pay

## 2021-02-01 ENCOUNTER — Other Ambulatory Visit (HOSPITAL_COMMUNITY): Payer: Self-pay

## 2021-02-07 ENCOUNTER — Other Ambulatory Visit (HOSPITAL_COMMUNITY): Payer: Self-pay

## 2021-02-08 ENCOUNTER — Other Ambulatory Visit (HOSPITAL_COMMUNITY): Payer: Self-pay

## 2021-02-08 MED FILL — Erenumab-aooe Subcutaneous Soln Auto-Injector 140 MG/ML: SUBCUTANEOUS | 28 days supply | Qty: 1 | Fill #4 | Status: AC

## 2021-02-09 ENCOUNTER — Other Ambulatory Visit (HOSPITAL_COMMUNITY): Payer: Self-pay

## 2021-02-10 ENCOUNTER — Other Ambulatory Visit (HOSPITAL_COMMUNITY): Payer: Self-pay

## 2021-02-10 ENCOUNTER — Other Ambulatory Visit: Payer: Self-pay | Admitting: Neurology

## 2021-02-10 MED ORDER — ELETRIPTAN HYDROBROMIDE 40 MG PO TABS
ORAL_TABLET | ORAL | 0 refills | Status: DC
Start: 2021-02-10 — End: 2021-07-13
  Filled 2021-02-10: qty 10, 30d supply, fill #0
  Filled 2021-03-01: qty 10, 28d supply, fill #0

## 2021-02-11 ENCOUNTER — Other Ambulatory Visit (HOSPITAL_COMMUNITY): Payer: Self-pay

## 2021-02-13 NOTE — Telephone Encounter (Signed)
F/u   Arali Preble Key: [id number] - PA Case ID: [id number] help? Call us at [phone] Status Sent to [employer] Drug Eletriptan Hydrobromide 40MG  tablets Form [employer] ePA Form 2017 NCPDP

## 2021-02-15 ENCOUNTER — Other Ambulatory Visit (HOSPITAL_COMMUNITY): Payer: Self-pay

## 2021-02-16 ENCOUNTER — Other Ambulatory Visit (HOSPITAL_COMMUNITY): Payer: Self-pay

## 2021-02-20 ENCOUNTER — Other Ambulatory Visit (HOSPITAL_COMMUNITY): Payer: Self-pay

## 2021-03-01 ENCOUNTER — Telehealth: Payer: Self-pay

## 2021-03-01 ENCOUNTER — Other Ambulatory Visit (HOSPITAL_COMMUNITY): Payer: Self-pay

## 2021-03-01 MED FILL — Erenumab-aooe Subcutaneous Soln Auto-Injector 140 MG/ML: SUBCUTANEOUS | 28 days supply | Qty: 1 | Fill #5 | Status: AC

## 2021-03-01 NOTE — Telephone Encounter (Signed)
F/u   Message from Plan  The request has been approved. The authorization is effective for a maximum of 12 fills from 03/01/2021 to 02/28/2022, as long as the member is enrolled in their current health plan. The request was approved with a quantity restriction. This has been approved for a quantity limit of 1 with a day supply limit of 30. A written notification letter will follow with additional details.

## 2021-03-01 NOTE — Telephone Encounter (Signed)
New message   FEATHER BERRIE (Key: [id number]) Aimovig 140MG /ML auto-injectors   Form [employer] ePA Form 2017 NCPDP Created 7 days ago Sent to Plan 2 minutes ago Plan Response 1 minute ago Submit Clinical Questions less than a minute ago Determination Wait for Determination Please wait for [employer] 2017 to return a determination.

## 2021-03-02 ENCOUNTER — Other Ambulatory Visit (HOSPITAL_COMMUNITY): Payer: Self-pay

## 2021-03-17 ENCOUNTER — Ambulatory Visit: Payer: 59 | Admitting: Neurology

## 2021-03-28 ENCOUNTER — Ambulatory Visit: Payer: 59 | Admitting: Neurology

## 2021-03-29 ENCOUNTER — Other Ambulatory Visit (HOSPITAL_COMMUNITY): Payer: Self-pay

## 2021-04-05 ENCOUNTER — Other Ambulatory Visit: Payer: Self-pay | Admitting: Neurology

## 2021-04-06 ENCOUNTER — Other Ambulatory Visit (HOSPITAL_COMMUNITY): Payer: Self-pay

## 2021-04-06 MED ORDER — AIMOVIG 140 MG/ML ~~LOC~~ SOAJ
140.0000 mg | SUBCUTANEOUS | 4 refills | Status: DC
  Filled 2021-04-06: qty 1, 28d supply, fill #0
  Filled 2021-05-15: qty 1, 28d supply, fill #1
  Filled 2021-06-12: qty 1, 28d supply, fill #2
  Filled 2021-07-10: qty 1, 28d supply, fill #3
  Filled 2021-08-08: qty 1, 28d supply, fill #4

## 2021-05-15 ENCOUNTER — Other Ambulatory Visit (HOSPITAL_COMMUNITY): Payer: Self-pay

## 2021-05-26 ENCOUNTER — Other Ambulatory Visit: Payer: Self-pay | Admitting: Physician Assistant

## 2021-05-31 ENCOUNTER — Other Ambulatory Visit (HOSPITAL_COMMUNITY): Payer: Self-pay

## 2021-06-01 ENCOUNTER — Other Ambulatory Visit: Payer: Self-pay | Admitting: Neurology

## 2021-06-01 ENCOUNTER — Telehealth: Payer: Self-pay | Admitting: Neurology

## 2021-06-01 ENCOUNTER — Other Ambulatory Visit (HOSPITAL_COMMUNITY): Payer: Self-pay

## 2021-06-01 ENCOUNTER — Other Ambulatory Visit: Payer: Self-pay

## 2021-06-01 ENCOUNTER — Telehealth: Payer: Self-pay | Admitting: Physician Assistant

## 2021-06-01 MED ORDER — METRONIDAZOLE 500 MG PO TABS
ORAL_TABLET | ORAL | 0 refills | Status: DC
  Filled 2021-06-01: qty 14, 7d supply, fill #0

## 2021-06-01 MED ORDER — TRUDHESA 0.725 MG/ACT NA AERS: 1.0000 | INHALATION_SPRAY | NASAL | 5 refills | Status: DC

## 2021-06-01 MED ORDER — ARIPIPRAZOLE 20 MG PO TABS
ORAL_TABLET | Freq: Every day | ORAL | 0 refills | Status: DC
  Filled 2021-06-01: qty 30, 30d supply, fill #0

## 2021-06-01 MED ORDER — PREDNISONE 10 MG (21) PO TBPK
ORAL_TABLET | ORAL | 0 refills | Status: DC
  Filled 2021-06-01: qty 21, 6d supply, fill #0

## 2021-06-01 NOTE — Telephone Encounter (Signed)
Patient needs some type of medication to help with her migraines

## 2021-06-01 NOTE — Telephone Encounter (Signed)
Dr. Everlena Cooper I spoke to patient and she is going to try the prednisone taper pack and she would like to try the nasal spray as a rescue med. I have put in the prednisone pack if you would add the nasal spray script. I left it pending to make sure my directions are correct

## 2021-06-01 NOTE — Telephone Encounter (Signed)
Called patient and got more information about migraine  How frequent or the headaches (on average, how many days a week/month are they occurring)?  Migraine started day after Christmas How long do the headaches last?  she has had a couple moments of breakthrough but it keeps coming back Verify what preventative medication and dose you are taking (e.g. topiramate, propranolol, amitriptyline, Emgality, etc)  Aimovig she said she has been taking regularly but it is not helping at all Verify which rescue medication you are taking (triptan, Advil, Excedrin, Aleve, Ubrelvy, etc)  none mentioned  How often are you taking pain relievers/analgesics/rescue mediction?  Did not take anything else Patient feeling very nauseous  from the pain of the migraine Patient said the Relpax was really helping her but insurance would not cover it

## 2021-06-01 NOTE — Telephone Encounter (Signed)
Rx sent 

## 2021-06-01 NOTE — Telephone Encounter (Signed)
Called patient unable to reach sent My Chart message with directions for nasal spray

## 2021-06-01 NOTE — Telephone Encounter (Signed)
Pt left message on voice mail asking why Abilify was denied. Pharmacy advised not a pt of TH any longer. Pt has apt 1/13. Pt # [phone]

## 2021-06-06 ENCOUNTER — Telehealth: Payer: Self-pay | Admitting: Physician Assistant

## 2021-06-06 NOTE — Telephone Encounter (Signed)
Pt called and said that she needs to have labs done before her appointment on 06/16/21. Please put labs in anhd then mail to the pt

## 2021-06-06 NOTE — Telephone Encounter (Signed)
Please order labs.

## 2021-06-07 NOTE — Telephone Encounter (Signed)
Which lab is she going to? [facility] or [facility]?

## 2021-06-07 NOTE — Telephone Encounter (Signed)
LVM to rtc with lab

## 2021-06-09 ENCOUNTER — Telehealth: Payer: Self-pay

## 2021-06-09 NOTE — Telephone Encounter (Signed)
New message  Your information has been sent to [employer].  Shannon Gould Key: [id number] - PA Case ID: [id number] help? Call us at [phone] Status Sent to [employer] Drug Trudhesa 0.725MG /ACT aerosol Form [employer] ePA Form 2017 NCPDP

## 2021-06-12 ENCOUNTER — Other Ambulatory Visit (HOSPITAL_COMMUNITY): Payer: Self-pay

## 2021-06-13 NOTE — Telephone Encounter (Signed)
F/u  Earline Andrades Key: [id number] - PA Case ID: [id number] help? Call us at [phone] Outcome Approvedtoday The request has been approved. The authorization is effective for a maximum of 12 fills from 06/13/2021 to 06/12/2022, as long as the member is enrolled in their current health plan. The request was approved as submitted. A written notification letter will follow with additional details. Drug Trudhesa 0.725MG/ACT aerosol Form [employer] ePA Form 2017 NCPDP 

## 2021-06-13 NOTE — Telephone Encounter (Signed)
F/u  Shannon Gould Key: [id number] - PA Case ID: [id number] help? Call us at [phone] Outcome Approvedtoday The request has been approved. The authorization is effective for a maximum of 12 fills from 06/13/2021 to 06/12/2022, as long as the member is enrolled in their current health plan. The request was approved as submitted. A written notification letter will follow with additional details. Drug Trudhesa 0.725MG /ACT aerosol Form [employer] ePA Form 2017 NCPDP

## 2021-06-16 ENCOUNTER — Encounter: Payer: Self-pay | Admitting: Physician Assistant

## 2021-06-16 ENCOUNTER — Other Ambulatory Visit (HOSPITAL_COMMUNITY): Payer: Self-pay

## 2021-06-16 ENCOUNTER — Other Ambulatory Visit: Payer: Self-pay

## 2021-06-16 ENCOUNTER — Ambulatory Visit: Payer: 59 | Admitting: Physician Assistant

## 2021-06-16 DIAGNOSIS — Z79899 Other long term (current) drug therapy: Secondary | ICD-10-CM | POA: Diagnosis not present

## 2021-06-16 DIAGNOSIS — G4700 Insomnia, unspecified: Secondary | ICD-10-CM | POA: Diagnosis not present

## 2021-06-16 DIAGNOSIS — F411 Generalized anxiety disorder: Secondary | ICD-10-CM

## 2021-06-16 DIAGNOSIS — R5383 Other fatigue: Secondary | ICD-10-CM | POA: Diagnosis not present

## 2021-06-16 DIAGNOSIS — F319 Bipolar disorder, unspecified: Secondary | ICD-10-CM | POA: Diagnosis not present

## 2021-06-16 DIAGNOSIS — F410 Panic disorder [episodic paroxysmal anxiety] without agoraphobia: Secondary | ICD-10-CM | POA: Diagnosis not present

## 2021-06-16 MED ORDER — VENLAFAXINE HCL ER 150 MG PO CP24
ORAL_CAPSULE | Freq: Every day | ORAL | 1 refills | Status: DC
  Filled 2021-06-16: qty 90, 90d supply, fill #0
  Filled 2021-09-27: qty 90, 90d supply, fill #1

## 2021-06-16 MED ORDER — LORAZEPAM 1 MG PO TABS
1.0000 mg | ORAL_TABLET | Freq: Three times a day (TID) | ORAL | 1 refills | Status: DC | PRN
  Filled 2021-06-16: qty 60, 10d supply, fill #0
  Filled 2021-09-12: qty 60, 10d supply, fill #1

## 2021-06-16 MED ORDER — ZOLPIDEM TARTRATE 10 MG PO TABS
10.0000 mg | ORAL_TABLET | Freq: Every evening | ORAL | 5 refills | Status: DC | PRN
  Filled 2021-06-16: qty 30, 30d supply, fill #0
  Filled 2021-07-19: qty 30, 30d supply, fill #1
  Filled 2021-09-12: qty 30, 30d supply, fill #2
  Filled 2021-11-07: qty 30, 30d supply, fill #3

## 2021-06-16 MED ORDER — BUSPIRONE HCL 15 MG PO TABS
ORAL_TABLET | ORAL | 1 refills | Status: DC
  Filled 2021-06-16: qty 60, 30d supply, fill #0
  Filled 2021-07-24: qty 60, 30d supply, fill #1

## 2021-06-16 MED ORDER — ARIPIPRAZOLE 20 MG PO TABS
ORAL_TABLET | Freq: Every day | ORAL | 1 refills | Status: DC
  Filled 2021-06-16: qty 90, fill #0
  Filled 2021-06-25: qty 90, 90d supply, fill #0
  Filled 2021-09-27: qty 30, 30d supply, fill #1
  Filled 2021-09-27: qty 60, 60d supply, fill #1

## 2021-06-16 MED ORDER — VENLAFAXINE HCL ER 75 MG PO CP24
ORAL_CAPSULE | ORAL | 1 refills | Status: DC
  Filled 2021-06-16: qty 90, 90d supply, fill #0
  Filled 2021-09-27: qty 90, 90d supply, fill #1

## 2021-06-16 NOTE — Progress Notes (Signed)
[facility]  Patient ID: Shannon Gould,  MRN: [id number]  PCP: Sharilyn Sites, MD  Date of Evaluation: 06/16/2021 Time spent:40 minutes  Chief Complaint:  Chief Complaint   Anxiety; Depression; Insomnia; Follow-up     HISTORY/CURRENT STATUS: HPI for routine med check.  More anxious for about 2 months. Having PA sometimes triggered but others not. Associated w/ palpitations which are really bad.  She also gets sweaty, her hands shake and she has shortness of breath sometimes.  She takes Xanax from an old prescription filled in November 2021.  It is still effective but takes a while to start working.  These panic attacks occur a couple of times a week and are pretty debilitating.  The Xanax makes her sleepy so difficult to take when she is working.  She feels like her other medications are working well.  She is able to enjoy things when she gets the chance.  Both of her sons are away at college and her 1 year old daughter is still at home.  That situation may be contributing to the anxiety somewhat as her daughter is not working or in school.  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  Personal hygiene is normal, ADLs are normal.  She does not cry easily.  No suicidal or homicidal thoughts.  Patient denies increased energy with decreased need for sleep, no increased talkativeness, no racing thoughts, no impulsivity or risky behaviors, no increased spending, no increased libido, no grandiosity, no increased irritability or anger, and no hallucinations.  If she does not take the Ambien she has trouble falling asleep and staying asleep.  She does not take the Ambien every night, just only when needed.  It works better than the mirtazapine did.  Review of Systems  Constitutional:  Positive for malaise/fatigue.       Especially tired after a long day's work  HENT: Negative.    Eyes: Negative.   Respiratory: Negative.    Cardiovascular: Negative.    Gastrointestinal: Negative.   Genitourinary: Negative.   Musculoskeletal: Negative.   Skin: Negative.   Neurological:  Positive for headaches.       Chronic migraines.  Treated by neurology.  Endo/Heme/Allergies: Negative.   Psychiatric/Behavioral:         See HPI.   Individual Medical History/ Review of Systems: Changes? :No    Past medications for mental health diagnoses include: Effexor, Vistaril, Xanax, Ambien, Zoloft, Wellbutrin which caused increased agitation and suicidal ideations, trazodone, Abilify, Sonata, Valium ( not sure why it was stopped) mirtazapine was not very effective  Allergies: Hydrocodone, Wellbutrin [bupropion], Caffeine, and Lactose intolerance (gi)  Current Medications:  Current Outpatient Medications:    albuterol (VENTOLIN HFA) 108 (90 Base) MCG/ACT inhaler, Inhale 2 puffs into the lungs every 6 (six) hours as needed. For shortness of breath, Disp: , Rfl:    b complex vitamins tablet, Take 1 tablet by mouth daily., Disp: 30 tablet, Rfl: 11   busPIRone (BUSPAR) 15 MG tablet, Take 1/3 pill by mouth twice daily for 1 week then 2/3 pill by mouth twice daily for 1 week and then 1 by mouth twice daily., Disp: 60 tablet, Rfl: 1   Erenumab-aooe (AIMOVIG) 140 MG/ML SOAJ, Inject 140 mg (1 pen) as directed every 28 days., Disp: 1 mL, Rfl: 4   furosemide (LASIX) 40 MG tablet, Take 40 mg by mouth daily as needed. , Disp: , Rfl:    levonorgestrel (MIRENA, 52 MG,) 20 MCG/24HR IUD, 1  each by Intrauterine route once. , Disp: , Rfl:    LORazepam (ATIVAN) 1 MG tablet, Take 1-2 tablets (1-2 mg total) by mouth every 8 (eight) hours as needed for anxiety., Disp: 60 tablet, Rfl: 1   naproxen (NAPROSYN) 500 MG tablet, TAKE 1 TABLET BY MOUTH 2 TIMES DAILY WITH A MEAL, Disp: 60 tablet, Rfl: 5   Omega-3 Fatty Acids (FISH OIL PO), Take by mouth., Disp: , Rfl:    ARIPiprazole (ABILIFY) 20 MG tablet, TAKE 1 TABLET BY MOUTH ONCE DAILY, Disp: 90 tablet, Rfl: 1   Dihydroergotamine Mesylate  HFA (TRUDHESA) 0.725 MG/ACT AERS, Place 1 spray into the nose as directed. Place 1 spray into each nostril.  May repeat once after 1 hour.  Maximum 2 doses in 24 hours., Disp: 8 mL, Rfl: 5   eletriptan (RELPAX) 40 MG tablet, TAKE 1 TABLET FOR MIGRAINE. MAY REPEAT IN 2 HOURS IF HEADACHE PERSISTS OR RECURS. MAX 2 TABLETS IN 24HRS (Patient not taking: Reported on 06/16/2021), Disp: 10 tablet, Rfl: 0   venlafaxine XR (EFFEXOR-XR) 150 MG 24 hr capsule, TAKE 1 CAPSULE BY MOUTH DAILY WITH BREAKFAST, Disp: 90 capsule, Rfl: 1   venlafaxine XR (EFFEXOR-XR) 75 MG 24 hr capsule, TAKE 1 CAPSULE BY MOUTH DAILY WITH BREAKFAST. TAKE WITH 150 MG TO EQUAL A DOSE OF  225 MG., Disp: 90 capsule, Rfl: 1   zolpidem (AMBIEN) 10 MG tablet, Take 1 tablet (10 mg total) by mouth at bedtime as needed for sleep., Disp: 30 tablet, Rfl: 5 Medication Side Effects: none  Family Medical/ Social History: Changes?  No  MENTAL HEALTH EXAM:  There were no vitals taken for this visit.There is no height or weight on file to calculate BMI.  General Appearance: Casual and Well Groomed  Eye Contact:  Good  Speech:  Clear and Coherent and Normal Rate  Volume:  Normal  Mood:  Anxious  Affect:  Anxious  Thought Process:  Goal Directed and Descriptions of Associations: Circumstantial  Orientation:  Full (Time, Place, and Person)  Thought Content: Logical   Suicidal Thoughts:  No  Homicidal Thoughts:  No  Memory:  WNL  Judgement:  Good  Insight:  Good  Psychomotor Activity:  Normal  Concentration:  Concentration: Good and Attention Span: Good  Recall:  Good  Fund of Knowledge: Good  Language: Good  Assets:  Desire for Improvement  ADL's:  Intact  Cognition: WNL  Prognosis:  Good    DIAGNOSES:    ICD-10-CM   1. Bipolar I disorder (HCC)  F31.9 Hemoglobin A1c    Comprehensive metabolic panel    Lipid panel    VITAMIN D 25 Hydroxy (Vit-D Deficiency, Fractures)    CBC with Differential/Platelet    2. GAD (generalized anxiety  disorder)  F41.1     3. Insomnia, unspecified type  G47.00     4. Panic disorder  F41.0     5. Encounter for long-term (current) use of medications  Z79.899 Hemoglobin A1c    Comprehensive metabolic panel    Lipid panel    VITAMIN D 25 Hydroxy (Vit-D Deficiency, Fractures)    CBC with Differential/Platelet    6. Fatigue, unspecified type  R53.83        Receiving Psychotherapy: No    RECOMMENDATIONS:  PDMP was reviewed. Last Ambien filled 11/01/2020, Xanax filled 05/02/20. I provided 40 minutes of face to face time during this encounter, including time spent before and after the visit in records review, medical decision making, counseling pertinent to today's  visit, and charting.  We discussed different options for anxiety.  I recommend changing Xanax to Ativan, as it is usually less sedating.  But it is about half as strong so she may need to double the dose.  She understands. Also discussed adding BuSpar.  She has never taken it.  Her daughter takes it and it is helpful so she would like to try it.  Benefits, risks, side effects were discussed and she accepts. Discontinue Xanax. Discontinue mirtazapine. Continue Abilify 20 mg, 1 p.o. every morning. Start BuSpar 15 mg 1/3 tablet twice daily for 1 week, then increase to 2/3 tablet twice daily for 1 week, then increase to 1 tablet twice daily for anxiety. Start Ativan 1 mg, 1-2 p.o. 3 times daily as needed anxiety. Continue Effexor XR 150 mg +75 mg= 225 mg p.o. daily. Continue Ambien 10 mg, 1 nightly as needed sleep. Lab ordered as above. Return in 6 weeks.  Donnal Moat, PA-C

## 2021-06-17 LAB — LIPID PANEL
Chol/HDL Ratio: 3.3 ratio (ref 0.0–4.4)
Cholesterol, Total: 224 mg/dL — ABNORMAL HIGH (ref 100–199)
HDL: 67 mg/dL (ref >39)
LDL Chol Calc (NIH): 144 mg/dL — ABNORMAL HIGH (ref 0–99)
Triglycerides: 77 mg/dL (ref 0–149)
VLDL Cholesterol Cal: 13 mg/dL (ref 5–40)

## 2021-06-17 LAB — CBC WITH DIFFERENTIAL/PLATELET
Basophils Absolute: 0 10*3/uL (ref 0.0–0.2)
Basos: 1 %
EOS (ABSOLUTE): 0.2 10*3/uL (ref 0.0–0.4)
Eos: 3 %
Hematocrit: 42.8 % (ref 34.0–46.6)
Hemoglobin: 14.1 g/dL (ref 11.1–15.9)
Immature Grans (Abs): 0 10*3/uL (ref 0.0–0.1)
Immature Granulocytes: 0 %
Lymphocytes Absolute: 2.2 10*3/uL (ref 0.7–3.1)
Lymphs: 34 %
MCH: 27.1 pg (ref 26.6–33.0)
MCHC: 32.9 g/dL (ref 31.5–35.7)
MCV: 82 fL (ref 79–97)
Monocytes Absolute: 0.5 10*3/uL (ref 0.1–0.9)
Monocytes: 8 %
Neutrophils Absolute: 3.5 10*3/uL (ref 1.4–7.0)
Neutrophils: 54 %
Platelets: 275 10*3/uL (ref 150–450)
RBC: 5.2 x10E6/uL (ref 3.77–5.28)
RDW: 12.4 % (ref 11.7–15.4)
WBC: 6.4 10*3/uL (ref 3.4–10.8)

## 2021-06-17 LAB — HEMOGLOBIN A1C
Est. average glucose Bld gHb Est-mCnc: 114 mg/dL
Hgb A1c MFr Bld: 5.6 % (ref 4.8–5.6)

## 2021-06-17 LAB — VITAMIN D 25 HYDROXY (VIT D DEFICIENCY, FRACTURES): Vit D, 25-Hydroxy: 12 ng/mL — ABNORMAL LOW (ref 30.0–100.0)

## 2021-06-19 NOTE — Progress Notes (Signed)
Please contact [facility] and have them send CMP results, it is not included with these labs.  Thanks.

## 2021-06-20 ENCOUNTER — Other Ambulatory Visit (HOSPITAL_COMMUNITY): Payer: Self-pay

## 2021-06-20 NOTE — Progress Notes (Signed)
Called [facility] and had them add on CMP. They said if they had enough blood for the test they would send an order to be signed, and if not enough blood they would let us know also.

## 2021-06-20 NOTE — Progress Notes (Signed)
Please call.

## 2021-06-21 ENCOUNTER — Encounter: Payer: Self-pay | Admitting: Physician Assistant

## 2021-06-22 ENCOUNTER — Other Ambulatory Visit (HOSPITAL_COMMUNITY): Payer: Self-pay

## 2021-06-22 NOTE — Progress Notes (Signed)
Please let patient know the following lab results:  CBC completely normal CMP also normal Hemoglobin A1c normal at 5.6 Total cholesterol was high at 224, triglycerides 77, HDL 67, and LDL 144.  I recommend she discuss these abnormal labs with her PCP.  Recommend low-fat diet, increase exercise, weight loss, although usual recommendations. Her vitamin D level is 12 which is extremely low.  It needs to be at least 50 for good mental health.  I will prescribe vitamin D 50,000 IUs 1 pill/week and then we will recheck this level in approximately 4 to 6 weeks.  Let me know which pharmacy and I will send a prescription in.  Thank you.

## 2021-06-23 ENCOUNTER — Telehealth: Payer: Self-pay | Admitting: Physician Assistant

## 2021-06-23 ENCOUNTER — Other Ambulatory Visit (HOSPITAL_COMMUNITY): Payer: Self-pay

## 2021-06-23 ENCOUNTER — Other Ambulatory Visit: Payer: Self-pay | Admitting: Physician Assistant

## 2021-06-23 MED ORDER — VITAMIN D (ERGOCALCIFEROL) 1.25 MG (50000 UNIT) PO CAPS
50000.0000 [IU] | ORAL_CAPSULE | ORAL | 0 refills | Status: DC
  Filled 2021-06-23: qty 12, 84d supply, fill #0

## 2021-06-23 NOTE — Progress Notes (Signed)
Prescription was sent

## 2021-06-23 NOTE — Telephone Encounter (Signed)
Shannon Gould is returning the person who called her regarding her lab results. Please call her back at [phone].

## 2021-06-23 NOTE — Telephone Encounter (Signed)
Called her back

## 2021-06-23 NOTE — Progress Notes (Signed)
LVM to [facility] 

## 2021-06-26 ENCOUNTER — Other Ambulatory Visit (HOSPITAL_COMMUNITY): Payer: Self-pay

## 2021-06-30 ENCOUNTER — Other Ambulatory Visit (HOSPITAL_COMMUNITY): Payer: Self-pay

## 2021-06-30 DIAGNOSIS — E559 Vitamin D deficiency, unspecified: Secondary | ICD-10-CM | POA: Diagnosis not present

## 2021-06-30 DIAGNOSIS — R310 Gross hematuria: Secondary | ICD-10-CM | POA: Diagnosis not present

## 2021-06-30 DIAGNOSIS — Z6841 Body Mass Index (BMI) 40.0 and over, adult: Secondary | ICD-10-CM | POA: Diagnosis not present

## 2021-06-30 DIAGNOSIS — R600 Localized edema: Secondary | ICD-10-CM | POA: Diagnosis not present

## 2021-06-30 DIAGNOSIS — G43009 Migraine without aura, not intractable, without status migrainosus: Secondary | ICD-10-CM | POA: Diagnosis not present

## 2021-06-30 DIAGNOSIS — D509 Iron deficiency anemia, unspecified: Secondary | ICD-10-CM | POA: Diagnosis not present

## 2021-06-30 MED ORDER — MOUNJARO 2.5 MG/0.5ML ~~LOC~~ SOAJ
SUBCUTANEOUS | 0 refills | Status: DC
  Filled 2021-06-30: qty 2, 28d supply, fill #0

## 2021-06-30 MED ORDER — MOUNJARO 5 MG/0.5ML ~~LOC~~ SOAJ: SUBCUTANEOUS | 2 refills | Status: DC

## 2021-07-04 ENCOUNTER — Other Ambulatory Visit (HOSPITAL_COMMUNITY): Payer: Self-pay

## 2021-07-05 ENCOUNTER — Encounter: Payer: Self-pay | Admitting: Physician Assistant

## 2021-07-10 ENCOUNTER — Other Ambulatory Visit (HOSPITAL_COMMUNITY): Payer: Self-pay

## 2021-07-10 MED ORDER — WEGOVY 0.25 MG/0.5ML ~~LOC~~ SOAJ
SUBCUTANEOUS | 2 refills | Status: DC
  Filled 2021-07-10 – 2021-07-13 (×3): qty 2, 28d supply, fill #0

## 2021-07-11 ENCOUNTER — Other Ambulatory Visit (HOSPITAL_COMMUNITY): Payer: Self-pay

## 2021-07-12 NOTE — Progress Notes (Signed)
07/13/2021 Shannon Gould [id number] Aug 07, 1975   ASSESSMENT AND PLAN:   Gastroesophageal reflux disease without esophagitis she reports symptoms are currently well controlled, and denies breakthrough reflux, burning in chest, hoarseness or cough. Lifestyle changes discussed, avoid NSAIDS Weight loss discussed  Rectal bleeding Normal rectal exam, negative hemoccult, no anemia on recent CBC With history very likely constipation with rectal fissure, possible anterior fissure on exam with tenderness.  Discussed with patient since so close to screening age possibility of a colonoscopy, states prefers to not do colonoscopy at this time.  Will send in medication, treat constipation, if not better will schedule for colon, patient agrees.  -     diltiazem 2 % GEL; Apply 1 application topically 2 (two) times daily.  Chronic idiopathic constipation Recent TSH that was normal Will refer to pelvic floor PT- possible component of pelvis floor dysfunction, do miralax/benefiber If not better can consider linzess. -     polyethylene glycol (MIRALAX) 17 g packet; Take 17 g by mouth daily. -     Wheat Dextrin (BENEFIBER) POWD; Use as directed, daily -     Ambulatory referral to Physical Therapy   Future Appointments  Date Time Provider Department Center  08/04/2021 10:00 AM Melony Overly T, PA-C [facility] None  09/06/2021 10:30 AM Drema Dallas, DO [facility] None    Patient Care Team: Assunta Found, MD as PCP - General (Family Medicine) West Bali, MD (Inactive) (Gastroenterology) Drema Dallas, DO as Consulting Physician (Neurology)  HISTORY OF PRESENT ILLNESS: 46 y.o. AA female referred by Assunta Found, MD presents for evaluation of rectal bleeding.   She has medical history significant for depression, general anxiety disorder, insomnia, dementia, migraines, GERD, obesity.   Patient was prescribed Wegovy, has not started. Surgical history includes vaginal sling 2020,  cholecystectomy.  States she had low iron when she was a teenager, use to have menorrhagia but has had IUD since 2004 and this has helped her menses.  She states last 3 months she has noticed blood in her stool, last seen a week ago. BRB on TP, dripping blood and in water.  She has rectal pain with BM's, having hard BM's with straining, BM every 1-2 x week which is normal for her.  Will feel feel her rectum stretch with the BM and feel a tearing sensation, denies burning/itching.  Has incomplete BM's. Has urinary frequency and stress incontinence.  Has been on fiber gummies x 3 weeks without help.  She has 3 kids, 22, 19, and 18, had hemorrhoids with them.   She does have GERD, every once a month, better with tums.  Patient denies  dysphagia, nausea, vomiting, melena.  Patient denies change in bowel habits,diarrhea.  Denies changes in appetite, unintentional weight loss.  No family history of GI malignancy.   External labs and notes reviewed this visit: Labs reviewed from [facility] show patient was seen 06/30/2021, labs from 3 weeks ago show Hgb 14.1, MCV 82 platelets 275  Current Medications:   Current Outpatient Medications (Endocrine & Metabolic):    levonorgestrel (MIRENA, 52 MG,) 20 MCG/24HR IUD, 1 each by Intrauterine route once.   Current Outpatient Medications (Cardiovascular):    diltiazem 2 % GEL*, Apply 1 application topically 2 (two) times daily.   furosemide (LASIX) 40 MG tablet, Take 40 mg by mouth daily as needed.   Current Outpatient Medications (Respiratory):    albuterol (VENTOLIN HFA) 108 (90 Base) MCG/ACT inhaler, Inhale 2 puffs into the lungs  every 6 (six) hours as needed. For shortness of breath  Current Outpatient Medications (Analgesics):    Dihydroergotamine Mesylate HFA (TRUDHESA) 0.725 MG/ACT AERS, Place 1 spray into the nose as directed. Place 1 spray into each nostril.  May repeat once after 1 hour.  Maximum 2 doses in 24 hours.    Erenumab-aooe (AIMOVIG) 140 MG/ML SOAJ, Inject 140 mg (1 pen) as directed every 28 days.   naproxen (NAPROSYN) 500 MG tablet, TAKE 1 TABLET BY MOUTH 2 TIMES DAILY WITH A MEAL   Current Outpatient Medications (Other):    ARIPiprazole (ABILIFY) 20 MG tablet, TAKE 1 TABLET BY MOUTH ONCE DAILY   b complex vitamins tablet, Take 1 tablet by mouth daily.   busPIRone (BUSPAR) 15 MG tablet, Take 1/3 pill by mouth twice daily for 1 week then 2/3 pill by mouth twice daily for 1 week and then 1 by mouth twice daily.   diltiazem 2 % GEL*, Apply 1 application topically 2 (two) times daily.   LORazepam (ATIVAN) 1 MG tablet, Take 1-2 tablets (1-2 mg total) by mouth every 8 (eight) hours as needed for anxiety.   Omega-3 Fatty Acids (FISH OIL PO), Take by mouth.   polyethylene glycol (MIRALAX) 17 g packet, Take 17 g by mouth daily.   venlafaxine XR (EFFEXOR-XR) 150 MG 24 hr capsule, TAKE 1 CAPSULE BY MOUTH DAILY WITH BREAKFAST   venlafaxine XR (EFFEXOR-XR) 75 MG 24 hr capsule, TAKE 1 CAPSULE BY MOUTH DAILY WITH BREAKFAST. TAKE WITH 150 MG TO EQUAL A DOSE OF  225 MG.   Vitamin D, Ergocalciferol, (DRISDOL) 1.25 MG (50000 UNIT) CAPS capsule, Take 1 capsule (50,000 Units total) by mouth every 7 (seven) days.   Wheat Dextrin (BENEFIBER) POWD, Use as directed, daily   zolpidem (AMBIEN) 10 MG tablet, Take 1 tablet (10 mg total) by mouth at bedtime as needed for sleep.   Semaglutide-Weight Management (WEGOVY) 0.25 MG/0.5ML SOAJ, Inject 0.25 mg into the skin once weekly (Patient not taking: Reported on 07/13/2021) * These medications belong to multiple therapeutic classes and are listed under each applicable group.  Medical History:  Past Medical History:  Diagnosis Date   Anemia    hx of patient reports    Anxiety    Asthma    Depression    GERD (gastroesophageal reflux disease)    Head ache    Allergies:  Allergies  Allergen Reactions   Hydrocodone Itching   Wellbutrin [Bupropion]    Caffeine Other (See  Comments)    Headache    Lactose Intolerance (Gi) Other (See Comments)    gas     Surgical History:  She  has a past surgical history that includes Cholecystectomy and transvaginal mesh. Family History:  Her family history includes Anemia in her sister; Anxiety disorder in her daughter; Asthma in her daughter, maternal grandmother, son, and son; CAD in her father; Depression in her daughter; Diabetes in her father, mother, paternal grandfather, and paternal grandmother; Heart attack in her father; Heart disease in her brother, father, and sister; Hyperlipidemia in her father; Hypertension in her brother, father, mother, paternal grandmother, and sister; Seizures in her paternal grandmother; Sleep apnea in her father. Social History:   reports that she has never smoked. She has never used smokeless tobacco. She reports current alcohol use. She reports that she does not use drugs.  REVIEW OF SYSTEMS  : All other systems reviewed and negative except where noted in the History of Present Illness.   PHYSICAL EXAM: BP 124/80  Pulse 64    Ht 5\' 3"  (1.6 m)    Wt 248 lb 3.2 oz (112.6 kg)    BMI 43.97 kg/m  General:   Pleasant, well developed female in no acute distress Head:  Normocephalic and atraumatic. Eyes: sclerae anicteric,conjunctive pink  Heart:  regular rate and rhythm, no murmurs or gallops Pulm: Clear anteriorly; no wheezing Abdomen:  Soft, Obese AB, skin exam normal, Normal bowel sounds. mild tenderness in the LLQ. Without guarding and Without rebound, without hepatomegaly. Rectal exam: possible anterior healing fissure but difficult to see, no hemorrhoids, normal rectal tone, no masses, negative hemoccult Extremities:  Without edema. Msk:  Symmetrical without gross deformities. Peripheral pulses intact.  Neurologic:  Alert and  oriented x4;  grossly normal neurologically. Skin:   Dry and intact without significant lesions or rashes. Psychiatric: Demonstrates good judgement and  reason without abnormal affect or behaviors.   Doree Albee, PA-C 11:26 AM

## 2021-07-13 ENCOUNTER — Encounter: Payer: Self-pay | Admitting: Physician Assistant

## 2021-07-13 ENCOUNTER — Other Ambulatory Visit (HOSPITAL_COMMUNITY): Payer: Self-pay

## 2021-07-13 ENCOUNTER — Ambulatory Visit: Payer: 59 | Admitting: Physician Assistant

## 2021-07-13 VITALS — BP 124/80 | HR 64 | Ht 63.0 in | Wt 248.2 lb

## 2021-07-13 DIAGNOSIS — K625 Hemorrhage of anus and rectum: Secondary | ICD-10-CM | POA: Diagnosis not present

## 2021-07-13 DIAGNOSIS — K219 Gastro-esophageal reflux disease without esophagitis: Secondary | ICD-10-CM

## 2021-07-13 DIAGNOSIS — K5904 Chronic idiopathic constipation: Secondary | ICD-10-CM

## 2021-07-13 MED ORDER — BENEFIBER PO POWD
ORAL | 0 refills | Status: DC
  Filled 2021-07-13: qty 529, fill #0

## 2021-07-13 MED ORDER — DILTIAZEM GEL 2 %
1.0000 "application " | Freq: Two times a day (BID) | CUTANEOUS | 3 refills | Status: DC
  Filled 2021-07-13: qty 30, 15d supply, fill #0

## 2021-07-13 MED ORDER — POLYETHYLENE GLYCOL 3350 17 G PO PACK
17.0000 g | PACK | Freq: Every day | ORAL | 3 refills | Status: DC
  Filled 2021-07-13: qty 30, 30d supply, fill #0

## 2021-07-13 NOTE — Patient Instructions (Addendum)
Recommend starting on a fiber supplement, can try metamucil first but if this causes gas/bloating switch to benefiber- Take with with a full 8 oz glass of water once a day. This can take 1 month to start helping, so try for at least one month.  Recommend increasing water and activity.  Get a squatty potty to use at home or try a stool, goal is to get your knees above your hips during a bowel movement.  Go to the ER if unable to pass gas, severe AB pain, unable to hold down food, any shortness of breath of chest pain.  If you have any worsening bleeding or issues please call back sooner.   Constipation, Adult Constipation is when a person has fewer than three bowel movements in a week, has difficulty having a bowel movement, or has stools (feces) that are dry, hard, or larger than normal. Constipation may be caused by an underlying condition. It may become worse with age if a person takes certain medicines and does not take in enough fluids. Follow these instructions at home: Eating and drinking  Eat foods that have a lot of fiber, such as beans, whole grains, and fresh fruits and vegetables. Limit foods that are low in fiber and high in fat and processed sugars, such as fried or sweet foods. These include french fries, hamburgers, cookies, candies, and soda. Drink enough fluid to keep your urine pale yellow. General instructions Exercise regularly or as told by your health care provider. Try to do 150 minutes of moderate exercise each week. Use the bathroom when you have the urge to go. Do not hold it in. Take over-the-counter and prescription medicines only as told by your health care provider. This includes any fiber supplements. During bowel movements: Practice deep breathing while relaxing the lower abdomen. Practice pelvic floor relaxation. Watch your condition for any changes. Let your health care provider know about them. Keep all follow-up visits as told by your health care provider.  This is important. Contact a health care provider if: You have pain that gets worse. You have a fever. You do not have a bowel movement after 4 days. You vomit. You are not hungry or you lose weight. You are bleeding from the opening between the buttocks (anus). You have thin, pencil-like stools. Get help right away if: You have a fever and your symptoms suddenly get worse. You leak stool or have blood in your stool. Your abdomen is bloated. You have severe pain in your abdomen. You feel dizzy or you faint. Summary Constipation is when a person has fewer than three bowel movements in a week, has difficulty having a bowel movement, or has stools (feces) that are dry, hard, or larger than normal. Eat foods that have a lot of fiber, such as beans, whole grains, and fresh fruits and vegetables. Drink enough fluid to keep your urine pale yellow. Take over-the-counter and prescription medicines only as told by your health care provider. This includes any fiber supplements. This information is not intended to replace advice given to you by your health care provider. Make sure you discuss any questions you have with your health care provider. Document Revised: 04/08/2019 Document Reviewed: 04/08/2019 Elsevier Patient Education  2022 Elsevier Inc.  We have referred you to pelvic floor therapy.  Please let our office know if you do not hear from their office.   Thank you for entrusting me with your care and choosing [facility].  Quentin Mulling, PA

## 2021-07-13 NOTE — Progress Notes (Signed)
Addendum: Reviewed and agree with assessment and management plan. If fissure treatment does not result in complete resolution of rectal bleeding then I would recommend we proceed with colonoscopy for further evaluation. Nicola Heinemann, Carie Caddy, MD

## 2021-07-14 ENCOUNTER — Other Ambulatory Visit (HOSPITAL_COMMUNITY): Payer: Self-pay

## 2021-07-19 ENCOUNTER — Other Ambulatory Visit (HOSPITAL_COMMUNITY): Payer: Self-pay

## 2021-07-25 ENCOUNTER — Other Ambulatory Visit (HOSPITAL_COMMUNITY): Payer: Self-pay

## 2021-07-26 ENCOUNTER — Encounter: Payer: Self-pay | Admitting: Physician Assistant

## 2021-07-31 IMAGING — MR MR SHOULDER*R* W/O CM
4 of 5 series · 27 of 40 positions shown · non-contrast
Comparison: None.

CLINICAL DATA: Right shoulder pain.  No prior surgery.

EXAM:
MRI OF THE RIGHT SHOULDER WITHOUT CONTRAST
TECHNIQUE: Multiplanar, multisequence MR imaging of the shoulder was performed.
No intravenous contrast was administered.

[Series 6: T2 fat-sat · axial · right · 3.0mm · 0.62mm/px · z∈[-18,+79]mm · 8 of 30 slices shown (1 of 3)]
[im 1/30]
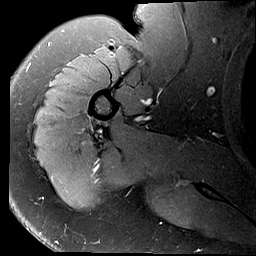
[im 5/30]
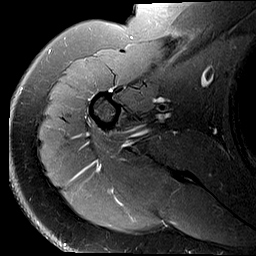
[im 9/30]
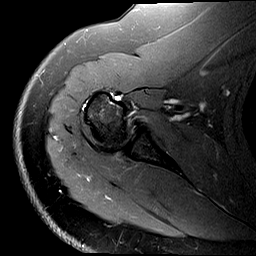
[im 13/30]
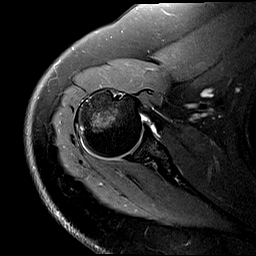
[im 17/30]
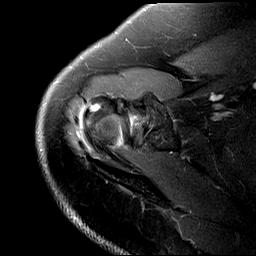
[im 21/30]
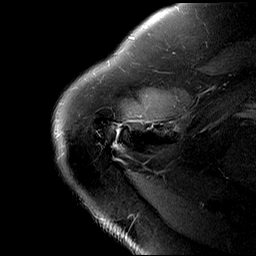
[im 25/30]
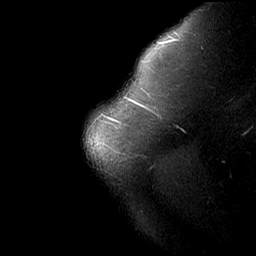
[im 30/30]
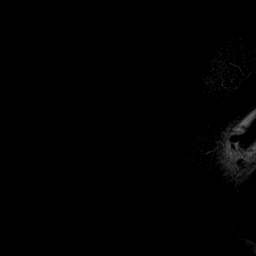

[Series 7: T2 fat-sat · oblique · right · 4.0mm · 0.27mm/px · 8 of 26 slices shown (2 of 3)]
[im 1/26]
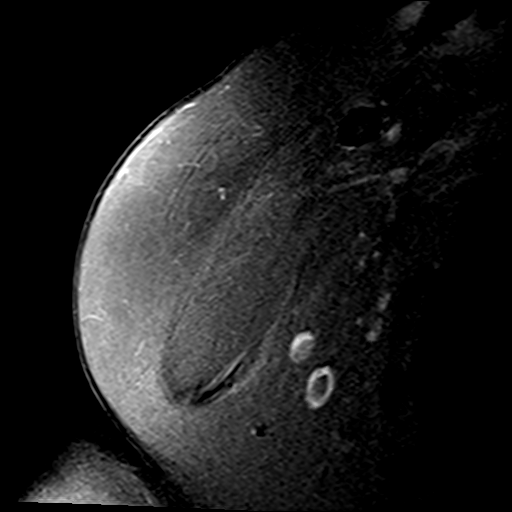
[im 4/26]
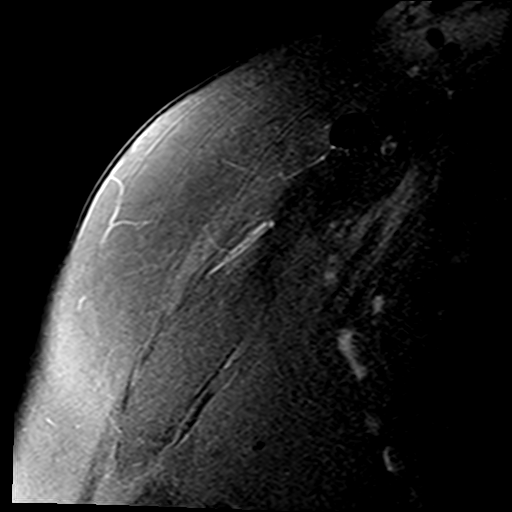
[im 8/26]
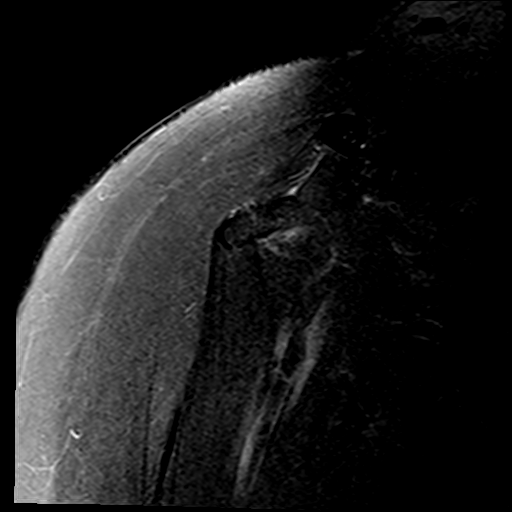
[im 11/26]
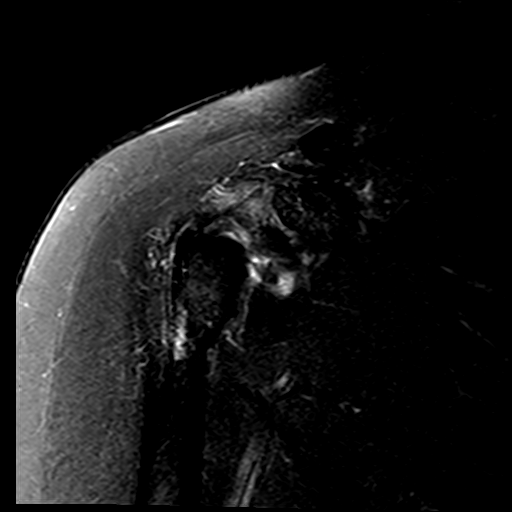
[im 15/26]
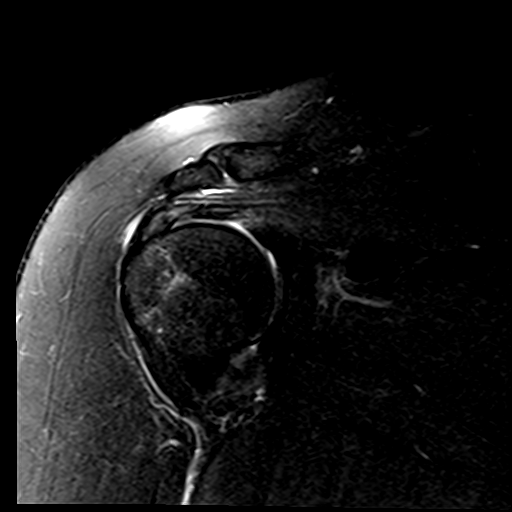
[im 18/26]
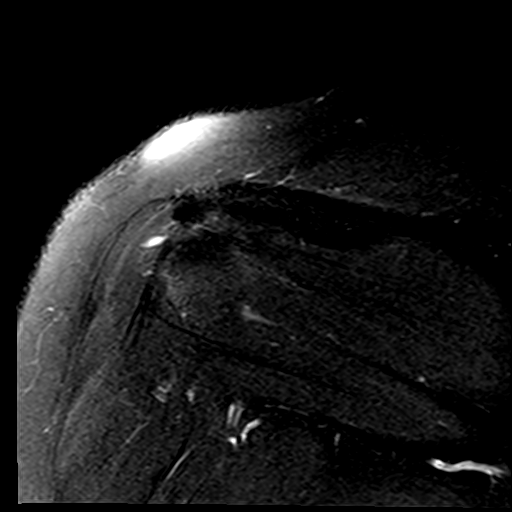
[im 22/26]
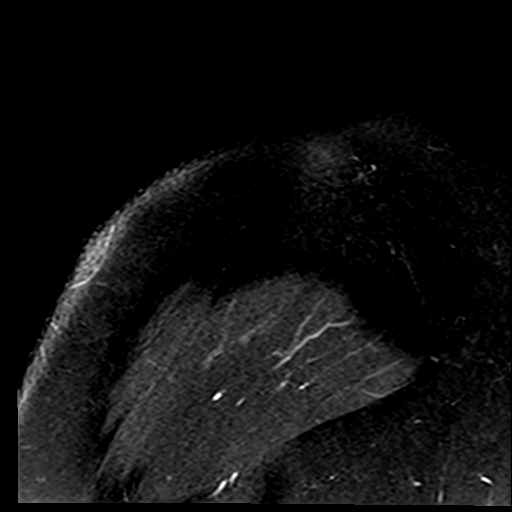
[im 26/26]
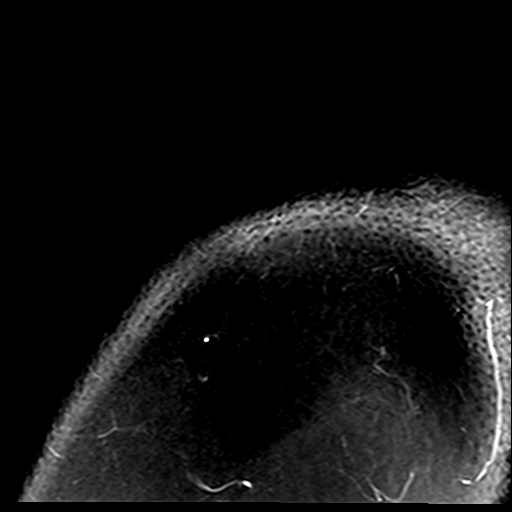

[Series 8: PD · oblique · right · 4.0mm · 0.27mm/px · 8 of 26 slices shown]
[im 1/26]
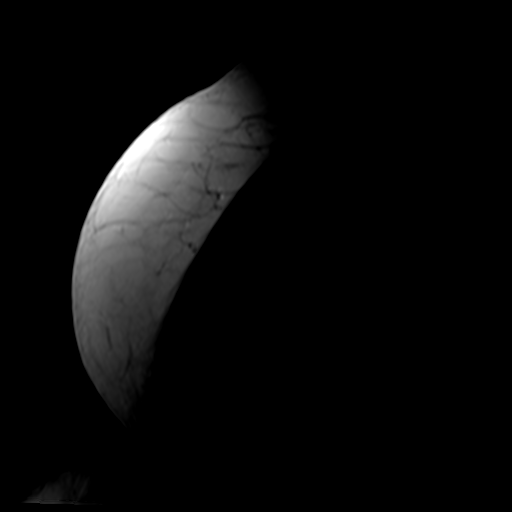
[im 4/26]
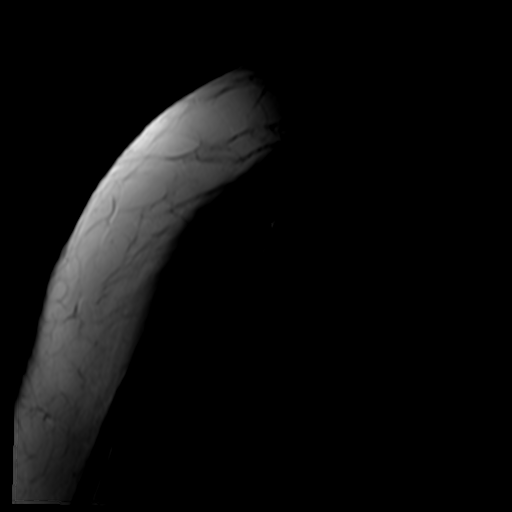
[im 8/26]
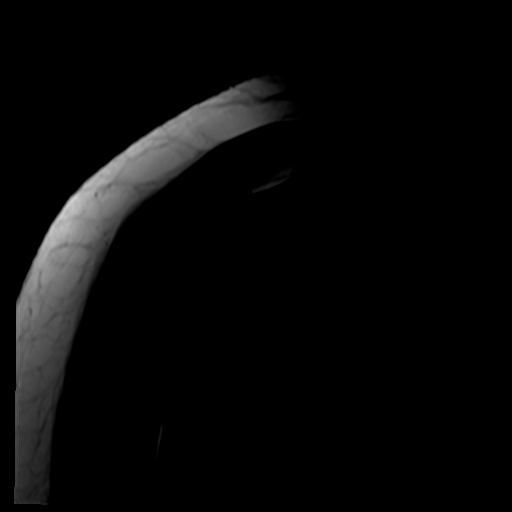
[im 11/26]
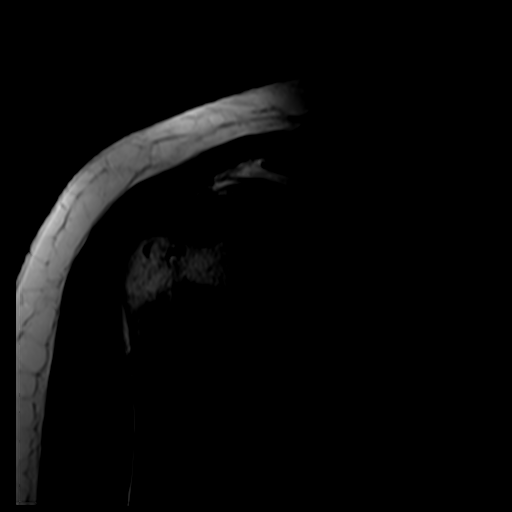
[im 15/26]
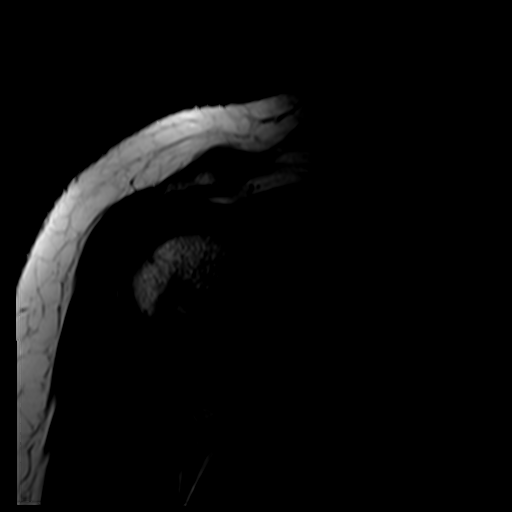
[im 18/26]
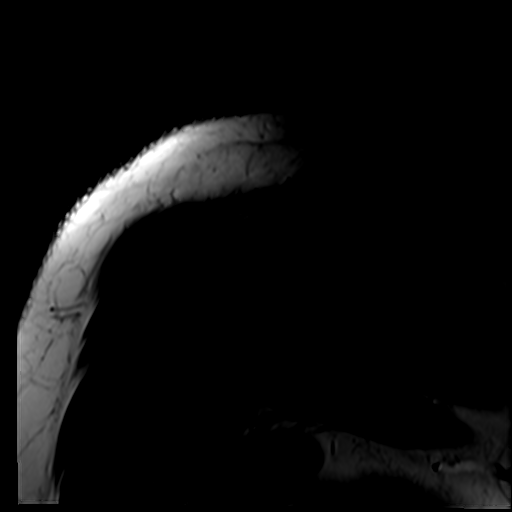
[im 22/26]
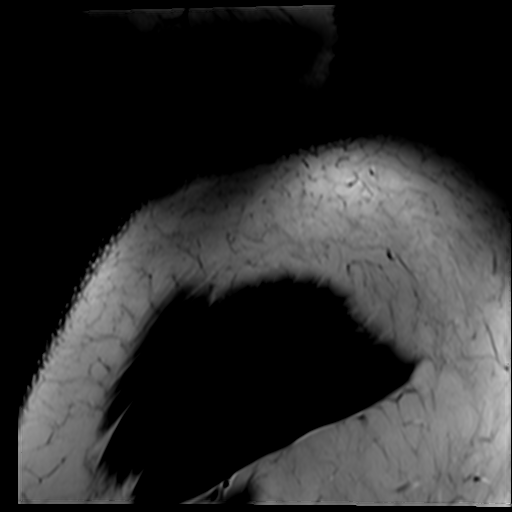
[im 26/26]
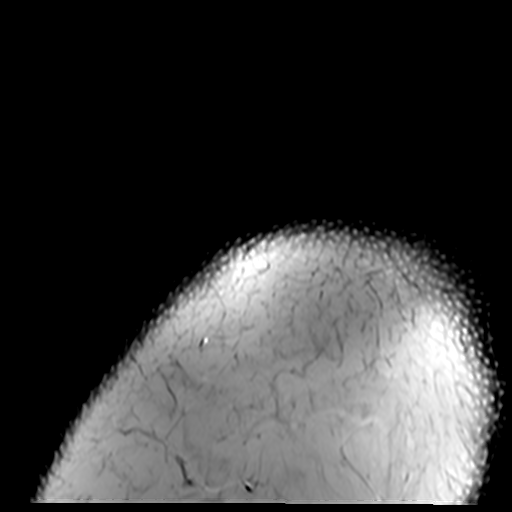

[Series 9: T2 fat-sat · oblique · right · 4.0mm · 0.55mm/px · 3 of 28 slices shown (3 of 3)]
[im 4/28]
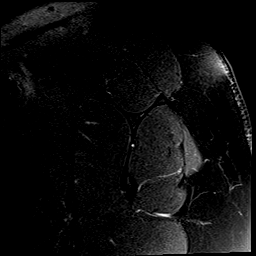
[im 16/28]
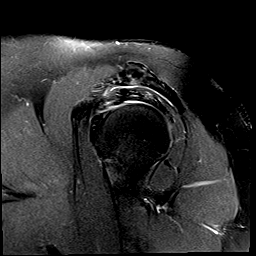
[im 24/28]
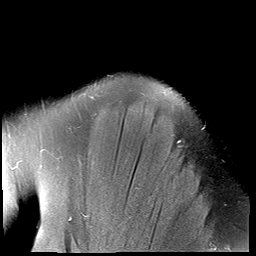

[27 of 40 positions shown; findings below may reference images not displayed]

FINDINGS: Rotator cuff: Moderate distal supraspinatus tendinosis with small
intrasubstance tear at the insertion. Moderate distal infraspinatus
tendinosis. The teres minor and subscapularis tendons are
unremarkable.

Muscles: No atrophy or abnormal signal of the muscles of the rotator
cuff.

Biceps long head:  Intact and normally positioned.

Acromioclavicular Joint: Normal acromioclavicular joint. Type II
acromion. Small amount of fluid in the subacromial/subdeltoid bursa.

Glenohumeral Joint: No joint effusion. No chondral defect.

Labrum: Grossly intact, but evaluation is limited by lack of
intraarticular fluid.

Bones:  No marrow abnormality, fracture or dislocation.

Other: None.
IMPRESSION: 1. Moderate distal supraspinatus tendinosis with small
intrasubstance tear at the insertion.
2. Moderate distal infraspinatus tendinosis.
3. Mild subacromial/subdeltoid bursitis.

## 2021-08-04 ENCOUNTER — Encounter: Payer: Self-pay | Admitting: Physician Assistant

## 2021-08-04 ENCOUNTER — Other Ambulatory Visit: Payer: Self-pay

## 2021-08-04 ENCOUNTER — Encounter: Payer: Self-pay | Admitting: Physical Therapy

## 2021-08-04 ENCOUNTER — Other Ambulatory Visit (HOSPITAL_COMMUNITY): Payer: Self-pay

## 2021-08-04 ENCOUNTER — Ambulatory Visit (INDEPENDENT_AMBULATORY_CARE_PROVIDER_SITE_OTHER): Payer: 59 | Admitting: Physician Assistant

## 2021-08-04 ENCOUNTER — Ambulatory Visit: Payer: 59 | Attending: Physician Assistant | Admitting: Physical Therapy

## 2021-08-04 DIAGNOSIS — F410 Panic disorder [episodic paroxysmal anxiety] without agoraphobia: Secondary | ICD-10-CM | POA: Diagnosis not present

## 2021-08-04 DIAGNOSIS — R278 Other lack of coordination: Secondary | ICD-10-CM | POA: Diagnosis not present

## 2021-08-04 DIAGNOSIS — F319 Bipolar disorder, unspecified: Secondary | ICD-10-CM | POA: Diagnosis not present

## 2021-08-04 DIAGNOSIS — F411 Generalized anxiety disorder: Secondary | ICD-10-CM

## 2021-08-04 DIAGNOSIS — M6281 Muscle weakness (generalized): Secondary | ICD-10-CM | POA: Insufficient documentation

## 2021-08-04 DIAGNOSIS — Z79899 Other long term (current) drug therapy: Secondary | ICD-10-CM | POA: Diagnosis not present

## 2021-08-04 DIAGNOSIS — R7989 Other specified abnormal findings of blood chemistry: Secondary | ICD-10-CM | POA: Diagnosis not present

## 2021-08-04 DIAGNOSIS — G4700 Insomnia, unspecified: Secondary | ICD-10-CM | POA: Diagnosis not present

## 2021-08-04 MED ORDER — BUSPIRONE HCL 15 MG PO TABS
15.0000 mg | ORAL_TABLET | Freq: Two times a day (BID) | ORAL | 1 refills | Status: DC
  Filled 2021-08-04: qty 180, 90d supply, fill #0
  Filled 2021-12-28: qty 180, 90d supply, fill #1

## 2021-08-04 NOTE — Progress Notes (Signed)
[facility] ? ?Patient ID: Shannon Gould,  ?MRN: [id number] ? ?PCP: Assunta Found, MD ? ?Date of Evaluation: 08/04/2021 ?Time spent:30 minutes ? ?Chief Complaint:  ?Chief Complaint   ?Anxiety; Depression; Insomnia; Follow-up ?  ? ? ?HISTORY/CURRENT STATUS: ?HPI for routine med check. ? ?At the last visit about 6 weeks ago we started BuSpar.  She has responded well and feels like the anxiety has improved.  She still needs the Ativan, mostly in the evening.  She feels uneasy, like something bad is going to happen.  When she takes it she feels much better. ? ?She is sleeping well.  Does need the Ambien almost nightly.  It is effective.  No nightmares are reported.  Feels rested when she gets. ? ?We found her to be vitamin D deficient when labs were ordered at the last visit.  She has been taking the weekly vitamin D for about a month now.  States she does have more energy.  That is the only major difference. ? ?Patient denies loss of interest in usual activities and is able to enjoy things.  Denies decreased energy or motivation.  Appetite has not changed.  No extreme sadness, tearfulness, or feelings of hopelessness.  Denies any changes in concentration, making decisions or remembering things.  Work is going well.  Denies suicidal or homicidal thoughts. ? ?Patient denies increased energy with decreased need for sleep, no increased talkativeness, no racing thoughts, no impulsivity or risky behaviors, no increased spending, no increased libido, no grandiosity, no increased irritability or anger, and no hallucinations. ? ?Denies dizziness, syncope, seizures, numbness, tingling, tremor, tics, unsteady gait, slurred speech, confusion. Denies muscle or joint pain, stiffness, or dystonia. ? ?Individual Medical History/ Review of Systems: Changes? :No   ? ?Past medications for mental health diagnoses include: ?Effexor, Vistaril, Xanax, Ambien, Zoloft, Wellbutrin which caused increased agitation and suicidal  ideations, trazodone, Abilify, Sonata, Valium ( not sure why it was stopped) mirtazapine was not very effective ? ?Allergies: Hydrocodone, Lexapro [escitalopram], Wellbutrin [bupropion], Caffeine, and Lactose intolerance (gi) ? ?Current Medications:  ?Current Outpatient Medications:  ?  albuterol (VENTOLIN HFA) 108 (90 Base) MCG/ACT inhaler, Inhale 2 puffs into the lungs every 6 (six) hours as needed. For shortness of breath, Disp: , Rfl:  ?  ARIPiprazole (ABILIFY) 20 MG tablet, TAKE 1 TABLET BY MOUTH ONCE DAILY, Disp: 90 tablet, Rfl: 1 ?  b complex vitamins tablet, Take 1 tablet by mouth daily., Disp: 30 tablet, Rfl: 11 ?  Dihydroergotamine Mesylate HFA (TRUDHESA) 0.725 MG/ACT AERS, Place 1 spray into the nose as directed. Place 1 spray into each nostril.  May repeat once after 1 hour.  Maximum 2 doses in 24 hours., Disp: 8 mL, Rfl: 5 ?  Erenumab-aooe (AIMOVIG) 140 MG/ML SOAJ, Inject 140 mg (1 pen) as directed every 28 days., Disp: 1 mL, Rfl: 4 ?  furosemide (LASIX) 40 MG tablet, Take 40 mg by mouth daily as needed. , Disp: , Rfl:  ?  levonorgestrel (MIRENA, 52 MG,) 20 MCG/24HR IUD, 1 each by Intrauterine route once. , Disp: , Rfl:  ?  LORazepam (ATIVAN) 1 MG tablet, Take 1-2 tablets (1-2 mg total) by mouth every 8 (eight) hours as needed for anxiety., Disp: 60 tablet, Rfl: 1 ?  Omega-3 Fatty Acids (FISH OIL PO), Take by mouth., Disp: , Rfl:  ?  polyethylene glycol (MIRALAX) 17 g packet, Take 17 g by mouth daily., Disp: 30 each, Rfl: 3 ?  venlafaxine XR (EFFEXOR-XR) 150 MG 24 hr  capsule, TAKE 1 CAPSULE BY MOUTH DAILY WITH BREAKFAST, Disp: 90 capsule, Rfl: 1 ?  venlafaxine XR (EFFEXOR-XR) 75 MG 24 hr capsule, TAKE 1 CAPSULE BY MOUTH DAILY WITH BREAKFAST. TAKE WITH 150 MG TO EQUAL A DOSE OF  225 MG., Disp: 90 capsule, Rfl: 1 ?  Vitamin D, Ergocalciferol, (DRISDOL) 1.25 MG (50000 UNIT) CAPS capsule, Take 1 capsule (50,000 Units total) by mouth every 7 (seven) days., Disp: 12 capsule, Rfl: 0 ?  Wheat Dextrin  (BENEFIBER) POWD, Use daily as directed, Disp: 529 g, Rfl: 0 ?  zolpidem (AMBIEN) 10 MG tablet, Take 1 tablet (10 mg total) by mouth at bedtime as needed for sleep., Disp: 30 tablet, Rfl: 5 ?  busPIRone (BUSPAR) 15 MG tablet, Take 1 tablet (15 mg total) by mouth 2 (two) times daily., Disp: 180 tablet, Rfl: 1 ?  diltiazem 2 % GEL, Apply 1 application topically 2 (two) times daily. (Patient not taking: Reported on 08/04/2021), Disp: 30 g, Rfl: 3 ?  Semaglutide-Weight Management (WEGOVY) 0.25 MG/0.5ML SOAJ, Inject 0.25 mg into the skin once weekly (Patient not taking: Reported on 07/13/2021), Disp: 2 mL, Rfl: 2 ?Medication Side Effects: none ? ?Family Medical/ Social History: Changes?  No ? ?MENTAL HEALTH EXAM: ? ?There were no vitals taken for this visit.There is no height or weight on file to calculate BMI.  ?General Appearance: Casual and Well Groomed  ?Eye Contact:  Good  ?Speech:  Clear and Coherent and Normal Rate  ?Volume:  Normal  ?Mood:  Euthymic  ?Affect:  Congruent  ?Thought Process:  Goal Directed and Descriptions of Associations: Circumstantial  ?Orientation:  Full (Time, Place, and Person)  ?Thought Content: Logical   ?Suicidal Thoughts:  No  ?Homicidal Thoughts:  No  ?Memory:  WNL  ?Judgement:  Good  ?Insight:  Good  ?Psychomotor Activity:  Normal  ?Concentration:  Concentration: Good and Attention Span: Good  ?Recall:  Good  ?Fund of Knowledge: Good  ?Language: Good  ?Assets:  Desire for Improvement  ?ADL's:  Intact  ?Cognition: WNL  ?Prognosis:  Good  ? ?06/16/2021 labs are reviewed. ?Vitamin D 12 ?CBC with differential normal. ?CMP all completely normal. ?Hemoglobin A1c 5.6. ?Lipid panel normal except total cholesterol 224, LDL 144.  Her PCP follows this ? ?DIAGNOSES:  ?  ICD-10-CM   ?1. Bipolar I disorder (HCC)  F31.9   ?  ?2. Low vitamin D level  R79.89 Vitamin D 1,25 dihydroxy  ?  ?3. Panic disorder  F41.0   ?  ?4. GAD (generalized anxiety disorder)  F41.1   ?  ?5. Insomnia, unspecified type  G47.00   ?   ?6. Encounter for long-term (current) use of medications  Z79.899 Vitamin D 1,25 dihydroxy  ?  ? ? ?Receiving Psychotherapy: No  ? ? ?RECOMMENDATIONS:  ?PDMP was reviewed. Last Ambien filled 07/19/2021.  Last Ativan filled 06/16/2021. ?I provided 30 minutes of face to face time during this encounter, including time spent before and after the visit in records review, medical decision making, counseling pertinent to today's visit, and charting.  ?I am glad to see her doing better! ?We discussed the vitamin D.  It has been low in the past but after stopping the 50,000 IUs weekly, she did not start taking the OTC dosage, and we will plan to do that if/when she gets off the weekly dose this time.  She can take vitamin D 2000 IUs OTC 6 days a week and then the 50,000 IUs once weekly right now but I  have asked her not to start that until she has her labs drawn.  That should be done sometime within the next month. ?She is doing well otherwise so no changes will be made. ? ?Continue vitamin D 50,000 IUs once a week. ?Continue Abilify 20 mg, 1 p.o. every morning. ?Continue BuSpar 15 mg, 1 tablet twice daily for anxiety. ?Continue Ativan 1 mg, 1-2 p.o. 3 times daily as needed anxiety. ?Continue Effexor XR 150 mg +75 mg= 225 mg p.o. daily. ?Continue Ambien 10 mg, 1 nightly as needed sleep. ?Lab ordered as above. ?Return in 3 to 4 months. ? ?Melony Overly, PA-C  ?
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
## 2021-08-08 ENCOUNTER — Other Ambulatory Visit (HOSPITAL_COMMUNITY): Payer: Self-pay

## 2021-08-18 ENCOUNTER — Other Ambulatory Visit (HOSPITAL_COMMUNITY): Payer: Self-pay

## 2021-08-28 ENCOUNTER — Other Ambulatory Visit: Payer: Self-pay

## 2021-08-28 ENCOUNTER — Ambulatory Visit: Payer: 59 | Admitting: Physical Therapy

## 2021-08-28 ENCOUNTER — Encounter: Payer: Self-pay | Admitting: Physical Therapy

## 2021-08-28 DIAGNOSIS — M6281 Muscle weakness (generalized): Secondary | ICD-10-CM

## 2021-08-28 DIAGNOSIS — R278 Other lack of coordination: Secondary | ICD-10-CM

## 2021-08-28 DIAGNOSIS — Z1231 Encounter for screening mammogram for malignant neoplasm of breast: Secondary | ICD-10-CM | POA: Diagnosis not present

## 2021-08-28 DIAGNOSIS — Z01419 Encounter for gynecological examination (general) (routine) without abnormal findings: Secondary | ICD-10-CM | POA: Diagnosis not present

## 2021-08-28 DIAGNOSIS — Z124 Encounter for screening for malignant neoplasm of cervix: Secondary | ICD-10-CM | POA: Diagnosis not present

## 2021-08-28 DIAGNOSIS — R87810 Cervical high risk human papillomavirus (HPV) DNA test positive: Secondary | ICD-10-CM | POA: Diagnosis not present

## 2021-08-28 NOTE — Therapy (Signed)
[place] [facility] ?[address] ?[place], [place], [address] ?Phone: [phone]   Fax:  [phone] ? ?OUTPATIENT PHYSICAL THERAPY TREATMENT NOTE ? ? ?Patient Name: Shannon Gould ?MRN: [id number] ?DOB:09-30-1975, 46 y.o., female ?Today's Date: 08/28/2021 ? ?PCP: Assunta FoundGolding, John, MD ?REFERRING PROVIDER: Assunta FoundGolding, John, MD ? ? PT End of Session - 08/28/21 0856   ? ? Visit Number 2   ? Date for PT Re-Evaluation 10/27/21   ? Authorization Type [facility]   ? Authorization - Number of Visits 2   ? Progress Note Due on Visit 25   ? PT Start Time [phone]   came late  ? PT Stop Time 0933   ? PT Time Calculation (min) 38 min   ? Activity Tolerance Patient tolerated treatment well   ? Behavior [facility] Therapy [facility] for tasks assessed/performed   ? ?  ?  ? ?  ? ? ?Past Medical History:  ?Diagnosis Date  ? Anemia   ? hx of patient reports   ? Anxiety   ? Asthma   ? Depression   ? GERD (gastroesophageal reflux disease)   ? Head ache   ? ?Past Surgical History:  ?Procedure Laterality Date  ? CHOLECYSTECTOMY    ? transvaginal mesh    ? ?Patient Active Problem List  ? Diagnosis Date Noted  ? Migraines 10/11/2020  ? GERD (gastroesophageal reflux disease)   ? Presenile dementia with depressive features 04/04/2018  ? GAD (generalized anxiety disorder) 04/04/2018  ? Insomnia 04/04/2018  ? Major depressive disorder, recurrent episode, moderate (HCC) 03/28/2018  ? Major depressive disorder, 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/31/2016  ? ? ?REFERRING DIAG: K59.04 Chronic idiopathic constipation ? ?THERAPY DIAG:  ?Muscle weakness (generalized) ? ?Other lack of coordination ? ?PERTINENT HISTORY: none ? ?PRECAUTIONS: None ? ?SUBJECTIVE: I have been doing the exercises to help my bowel. I am not taking as much Benefiber and Miralax.  ? ?PAIN:  ?Are you having pain? No ? ? ? ?PATIENT GOALS Be able to control urgency with urination and not we herself and work with constipation.   ? ? ?OBJECTIVE:  ? ?DIAGNOSTIC FINDINGS:  ?none ? ? ?COGNITION: ? Overall cognitive status: Within functional limits for tasks assessed   ?  ? ?POSTURE:  ?Good ? ?PALPATION: ?Internal Pelvic Floor anally-tightness in the anococcygeal ligament, no movement of the puborectalis, tender in the iliococcygeus. Vaginally-tightness in the post. Vaginal canal and left side tighter than right.  ? ?External Perineal Exam tightness along the external anal area.  ? ?GENERAL tightness in the lower abdomen. Pain in the right lower quadrant.  ? ?LUMBARAROM/PROM ? ?A/PROM A/PROM  ?08/28/2021  ?Flexion Full with fascial tightness in the lumbar  ?Extension Decreased by 50%  ?Right lateral flexion Full  ?Left lateral flexion full  ?Right rotation Decreased by 25%  ?Left rotation Decreased by 25%  ? (Blank rows = not tested) ? ? ?LE MMT: ? ?MMT Right ?08/28/2021 Left ?08/28/2021  ?Hip flexion    ?Hip extension 4/5 4/5  ?Hip abduction 4/5 4/5  ?Hip adduction 4/5 4/5  ?Hip internal rotation 4/5 4/5  ?Hip external rotation 4/5 4/5  ?Knee flexion    ?Knee extension    ?Ankle dorsiflexion    ?Ankle plantarflexion    ?Ankle inversion    ?Ankle eversion    ? ?PELVIC MMT: ?  ?MMT  ?08/28/2021  ?Vaginal 2/5 with no hug, more contraction ant. And post  ?Internal Anal Sphincter 2/5  ant, 1/5 post  ?External Anal Sphincter 2/5 ant. 1/5 post  ?Puborectalis 0/5  ?Diastasis Recti   ?(Blank rows = not tested) ? ? ?TONE: ?Increased.  ? ?PROLAPSE: ?Anterior wall  ? ? ?TODAY'S TREATMENT  08/28/2021 ?Therapeutic exercise: happy baby 30 sec; hamstring bil. 30 sec supine; piriformis bil. 30 sec. ; abdominal bracing holding for 5 sec with tactile cues. Bridges 10 x with some difficulty going fully through the range.  ?Hip adduction 40# bil. 10x each side; bil. Hip extension 40# 10x each;  ?Manual: circular massage to abdomen to improve peristalic motion of intestines, fascial release of the lower abdomen to lift up the intestines off the bladder and release around  the sac of douglas.  ? ?EVAL done today.  ? ? ?PATIENT EDUCATION:  ?Education details: Access Code: [email or web address] ?Person educated: Patient ?Education method: Explanation, demonstration, handout, tactile cues ?Education comprehension: verbalized understanding; return demonstration ? ? ?HOME EXERCISE PROGRAM: ?Access Code: [email or web address] ?URL: [email or web address] ?Date: 08/28/2021 ?Prepared by: Eulis Foster ? ?Exercises ?- Supine Hamstring Stretch  - 1 x daily - 3 x weekly - 1 sets - 2 reps - 30 sec hold ?- Supine Piriformis Stretch with Foot on Ground  - 1 x daily - 3 x weekly - 1 sets - 2 reps - 30 sec hold ?- Happy Baby with Pelvic Floor Lengthening  - 1 x daily - 3 x weekly - 1 sets - 1 reps - 30 sec  hold ?- Hooklying Transversus Abdominis Palpation  - 1 x daily - 3 x weekly - 1 sets - 10 reps - 5 sec hold ?- Bridge  - 1 x daily - 3 x weekly - 1 sets - 10 reps ?ASSESSMENT: ? ?CLINICAL IMPRESSION: ?Patient is a 46 y.o. female who was seen today for physical therapy treatment for chronic constipation and urinary leakage with urgency. Patient reports the abdominal massage is helping her with bowel movements and she is taking less Miralax and Benefiber. Patient is able to contract her lower abdominal correctly and with her hip exercises. Patient has difficulty with bridges due to trouble lifting her hips. Patient would benefit from skilled therapy to improve pelvic floor coordination to improve constipation and reduce urgency.  ? ? ?OBJECTIVE IMPAIRMENTS decreased coordination, weakness, pain  ? ?ACTIVITY LIMITATIONS toileting.  ? ?PERSONAL FACTORS Age, Fitness, Past/current experiences, and 1 comorbidity: bladder tack surgery are also affecting patient's functional outcome.  ? ? ?REHAB POTENTIAL: Good ? ?CLINICAL DECISION MAKING: Stable/uncomplicated ? ?EVALUATION COMPLEXITY: Low ? ? ?GOALS: ?Goals reviewed with patient? Yes ? ?SHORT TERM GOALS: ? ?Patient independent with initial HEP with pelvic floor  relaxation.  ?Target date: 09/25/2021 ?Goal status: INITIAL ? ?2.  Patient understand how to perform manual mobilization to improve peristalic motion of intestines.  ?Target date: 09/25/2021 ?Goal status: INITIAL ? ? ?LONG TERM GOALS: ? ?Patient is independent with advanced HEP to improve pelvic floor strength to reduce urgency.  ?Target date: 09/25/2021 ? and 11/20/2021 ?Goal status: INITIAL ? ?2.  Patient able to have 2 bowel movements per week due to increased relaxation of the pelvic floor.  ?Target date: 11/20/2021 ?Goal status: INITIAL ? ?3.  Patient able to walk to the bathroom without leaking urine due to improved pelvic floor endurance and coordination.  ?Target date: 11/20/2021 ?Goal status: INITIAL ? ?4.  Patient able to urinate every 2-3 hours due to improve pelvic floor endurance.  ?Target date: 11/20/2021 ?Goal status: INITIAL ? ? ? ?PLAN: ?PT FREQUENCY: 1x/week ? ?PT DURATION: 12  weeks ? ?PLANNED INTERVENTIONS: Therapeutic exercises, Therapeutic activity, Neuromuscular re-education, Patient/Family education, Joint mobilization, and Dry Needling, manual mobilization, taping, biofeedback, moist heat, cryotherapy ? ?PLAN FOR NEXT SESSION: Manual work to the pelvic floor along the rectum to the anococcygeal ligament, and obturator and puborectalis, diaphragmatic breathing, leg press. ; work on back fascial tightness ? ? ? ?Cherree Conerly, PT ?08/28/2021, 9:37 AM ? ?  ? ? ? ?Eulis Foster, PT ?08/28/21 9:37 AM ? ? ?[place] ?[facility] ?[address] ?[place], [place], [address] ?Phone: [phone]   Fax:  [phone] ?

## 2021-09-06 ENCOUNTER — Other Ambulatory Visit: Payer: Self-pay | Admitting: Neurology

## 2021-09-06 ENCOUNTER — Ambulatory Visit (HOSPITAL_BASED_OUTPATIENT_CLINIC_OR_DEPARTMENT_OTHER): Payer: 59 | Admitting: Cardiology

## 2021-09-06 ENCOUNTER — Encounter (HOSPITAL_BASED_OUTPATIENT_CLINIC_OR_DEPARTMENT_OTHER): Payer: Self-pay | Admitting: Cardiology

## 2021-09-06 ENCOUNTER — Ambulatory Visit: Payer: 59 | Admitting: Neurology

## 2021-09-06 ENCOUNTER — Encounter (HOSPITAL_BASED_OUTPATIENT_CLINIC_OR_DEPARTMENT_OTHER): Payer: Self-pay

## 2021-09-06 ENCOUNTER — Other Ambulatory Visit (HOSPITAL_COMMUNITY): Payer: Self-pay

## 2021-09-06 VITALS — BP 126/88 | HR 82 | Ht 63.0 in | Wt 245.4 lb

## 2021-09-06 DIAGNOSIS — Z6841 Body Mass Index (BMI) 40.0 and over, adult: Secondary | ICD-10-CM | POA: Diagnosis not present

## 2021-09-06 DIAGNOSIS — Z7189 Other specified counseling: Secondary | ICD-10-CM | POA: Diagnosis not present

## 2021-09-06 DIAGNOSIS — R0789 Other chest pain: Secondary | ICD-10-CM | POA: Diagnosis not present

## 2021-09-06 DIAGNOSIS — Z7182 Exercise counseling: Secondary | ICD-10-CM

## 2021-09-06 DIAGNOSIS — Z713 Dietary counseling and surveillance: Secondary | ICD-10-CM

## 2021-09-06 MED ORDER — WEGOVY 1 MG/0.5ML ~~LOC~~ SOAJ
1.0000 mg | SUBCUTANEOUS | 1 refills | Status: DC
  Filled 2021-09-06: qty 2, fill #0
  Filled 2021-12-13: qty 2, 28d supply, fill #0

## 2021-09-06 MED ORDER — WEGOVY 0.5 MG/0.5ML ~~LOC~~ SOAJ
0.5000 mg | SUBCUTANEOUS | 1 refills | Status: DC
  Filled 2021-09-06 – 2021-11-02 (×2): qty 2, 28d supply, fill #0

## 2021-09-06 MED ORDER — AIMOVIG 140 MG/ML ~~LOC~~ SOAJ
140.0000 mg | SUBCUTANEOUS | 0 refills | Status: DC
  Filled 2021-09-06: qty 1, 28d supply, fill #0

## 2021-09-06 NOTE — Patient Instructions (Signed)
Medication Instructions:  ?Semaglutide starting dose plan: ?-Start with the 0.25 mg dose every week for 4 weeks, ?-Then use the 0.5 mg dose every week for 4 weeks, ?-Then continue with 1 mg dose every week. We will meet when you are on the 1 mg dose to discuss timing of uptitration. ? ?Some nausea is normal, as is decreased appetite. If you have severe nausea or stomach pain, please contact us and stop the medication.  ? ?Teaching done with demo pen today and samples given today. ? ?*If you need a refill on your cardiac medications before your next appointment, please call your pharmacy* ? ? ?Lab Work: ?None ordered today ? ? ?Testing/Procedures: ?None ordered today ? ? ?Follow-Up: ?At [facility], you and your health needs are our priority.  As part of our continuing mission to provide you with exceptional heart care, we have created designated Provider Care Teams.  These Care Teams include your primary Cardiologist (physician) and Advanced Practice Providers (APPs -  Physician Assistants and Nurse Practitioners) who all work together to provide you with the care you need, when you need it. ? ?We recommend signing up for the patient portal called "MyChart".  Sign up information is provided on this After Visit Summary.  MyChart is used to connect with patients for Virtual Visits (Telemedicine).  Patients are able to view lab/test results, encounter notes, upcoming appointments, etc.  Non-urgent messages can be sent to your provider as well.   ?To learn more about what you can do with MyChart, go to [email or web address].   ? ?Your next appointment:   ?3 month(s) ? ?The format for your next appointment:   ?In Person ? ?Provider:   ?Jodelle Red, MD{ ? ? ? ?

## 2021-09-06 NOTE — Progress Notes (Signed)
?Cardiology Office Note:   ? ?Date:  09/08/2021  ? ?ID:  Shannon Gould, DOB July 17, 1975, MRN [id number] ? ?PCP:  Shannon Sites, MD  ?Cardiologist:  Shannon Dresser, MD PhD ? ?Referring MD: Shannon Sites, MD  ? ?CC: New patient consultation for chest discomfort and obesity ? ?History of Present Illness:   ? ?Shannon Gould is a 46 y.o. female with a hx of depression, anxiety who is seen for new consultation of chest discomfort and obesity. I have not seen her in over three years, so she is here to re-establish care as well. I initially met her 07/29/2018 as a new consult at the request of Shannon Sites, MD for the evaluation and management of chest pain/palpitations. ? ?Chest pain/palpitations: ?-Initial onset: early February ?-Quality: pressure, like a cramp ?-Frequency: happening several times/day; had one day when it was very painful overnight ?-Duration: severe episode lasted about 2 hours, usually last a few minutes ?-Associated symptoms: no nausea, SOB, diaphoresis, lightheadedness. Does move down left arm on occasion ?-Aggravating/alleviating factors: no clear. Not better with tums or Xanax ?-Prior cardiac history: none ?-Prior ECG/workup: stress echo >10 years ago at [facility], no further testing ?-Prior treatment: none ?-Alcohol: drinks about 1x/month ?-Tobacco: never ?-Comorbidities: no history of diabetes, high blood pressure, no history of high cholesterol ?-Exercise level: no intentional exercise, but can climb stairs, etc ?-Cardiac ROS: no shortness of breath, no PND, no orthopnea, no LE edema, no syncope ?-Family history: father has had 2 Mis, has three stents. First was in his late 40s/early 51s. Mother and father, as well as sister and brother, have high blood pressure. Shannon Gould had both MI and stroke.  ? ?Today: ?Overall, she is feeling good. However, she continues to suffer from some chest tightness. Her most recent episode was the other day, lasting for about 15 minutes. Usually her pain  occurs 1-2 times a month, which is stable. ? ?For weight loss, she has tried the [employer] and intermittent fasting (lost 5 lbs). For exercise she goes to [employer] and walks on the treadmill for about 30 minutes, and then the stationary bike for 15 minutes. Her highest adult weight is the same as her current weight, 245 lbs. She has also tried Topiramate which worked only for a while. ? ?Most recent lab work was in 06/2021. ? ?She denies any palpitations, shortness of breath, or peripheral edema. No lightheadedness, headaches, syncope, orthopnea, or PND. ? ? ?Past Medical History:  ?Diagnosis Date  ? Anemia   ? hx of patient reports   ? Anxiety   ? Asthma   ? Depression   ? GERD (gastroesophageal reflux disease)   ? Head ache   ? ? ?Past Surgical History:  ?Procedure Laterality Date  ? CHOLECYSTECTOMY    ? transvaginal mesh    ? ? ?Current Medications: ?Current Outpatient Medications on File Prior to Visit  ?Medication Sig  ? albuterol (VENTOLIN HFA) 108 (90 Base) MCG/ACT inhaler Inhale 2 puffs into the lungs every 6 (six) hours as needed. For shortness of breath  ? ARIPiprazole (ABILIFY) 20 MG tablet TAKE 1 TABLET BY MOUTH ONCE DAILY  ? b complex vitamins tablet Take 1 tablet by mouth daily.  ? busPIRone (BUSPAR) 15 MG tablet Take 1 tablet (15 mg total) by mouth 2 (two) times daily.  ? Dihydroergotamine Mesylate HFA (TRUDHESA) 0.725 MG/ACT AERS Place 1 spray into the nose as directed. Place 1 spray into each nostril.  May repeat once after 1 hour.  Maximum 2 doses in 24 hours.  ? furosemide (LASIX) 40 MG tablet Take 40 mg by mouth daily as needed.   ? levonorgestrel (MIRENA, 52 MG,) 20 MCG/24HR IUD 1 each by Intrauterine route once.   ? LORazepam (ATIVAN) 1 MG tablet Take 1-2 tablets (1-2 mg total) by mouth every 8 (eight) hours as needed for anxiety.  ? Omega-3 Fatty Acids (FISH OIL PO) Take by mouth.  ? polyethylene glycol (MIRALAX) 17 g packet Take 17 g by mouth daily.  ? venlafaxine XR (EFFEXOR-XR) 150 MG  24 hr capsule TAKE 1 CAPSULE BY MOUTH DAILY WITH BREAKFAST  ? venlafaxine XR (EFFEXOR-XR) 75 MG 24 hr capsule TAKE 1 CAPSULE BY MOUTH DAILY WITH BREAKFAST. TAKE WITH 150 MG TO EQUAL A DOSE OF  225 MG.  ? Vitamin D, Ergocalciferol, (DRISDOL) 1.25 MG (50000 UNIT) CAPS capsule Take 1 capsule (50,000 Units total) by mouth every 7 (seven) days.  ? Wheat Dextrin (BENEFIBER) POWD Use daily as directed  ? zolpidem (AMBIEN) 10 MG tablet Take 1 tablet (10 mg total) by mouth at bedtime as needed for sleep.  ? ?No current facility-administered medications on file prior to visit.  ?  ? ?Allergies:   Hydrocodone, Lexapro [escitalopram], Wellbutrin [bupropion], Caffeine, and Lactose intolerance (gi)  ? ?Social History  ? ?Socioeconomic History  ? Marital status: Single  ?  Spouse name: Not on file  ? Number of children: 3  ? Years of education: Not on file  ? Highest education level: Associate degree: occupational, Hotel manager, or vocational program  ?Occupational History  ? Occupation: LPN  ?  Employer: [employer]  ?Tobacco Use  ? Smoking status: Never  ? Smokeless tobacco: Never  ?Vaping Use  ? Vaping Use: Never used  ?Substance and Sexual Activity  ? Alcohol use: Yes  ?  Alcohol/week: 0.0 - 1.0 standard drinks  ?  Comment: rare  ? Drug use: No  ? Sexual activity: Yes  ?  Birth control/protection: I.U.D.  ?Other Topics Concern  ? Not on file  ?Social History Narrative  ? Patient is left-handed. She lives in a one level home with 2 of her children, one is at college. She avoids caffeine. She does not exercise.  ? ?Social Determinants of Health  ? ?Financial Resource Strain: Not on file  ?Food Insecurity: Not on file  ?Transportation Needs: Not on file  ?Physical Activity: Not on file  ?Stress: Not on file  ?Social Connections: Not on file  ?  ? ?Family History: ?The patient's family history includes Anemia in her sister; Anxiety disorder in her daughter; Asthma in her daughter, maternal grandmother, son, and son; CAD in  her father; Depression in her daughter; Diabetes in her father, mother, paternal grandfather, and paternal grandmother; Heart attack in her father; Heart disease in her brother, father, and sister; Hyperlipidemia in her father; Hypertension in her brother, father, mother, paternal grandmother, and sister; Seizures in her paternal grandmother; Sleep apnea in her father. ? ?ROS:   ?Please see the history of present illness. ?(+) Chest tightness ?All other systems are reviewed and negative.  ? ? ?EKGs/Labs/Other Studies Reviewed:   ? ?The following studies were reviewed today: ?Notes from PCP ? ?ETT 08/07/2018: ?Blood pressure demonstrated a normal response to exercise. ?There was no ST segment deviation noted during stress. ?  ?ETT with normal exercise tolerance (6:29); No CP; normal BP response; no ST changes; negative adequate ETT; Duke treadmill score 6. ? ?EKG:  EKG is personally reviewed.   ?  09/06/2021: NSR at 82 bpm ?07/29/2018: normal sinus rhythm ? ?Recent Labs: ?06/16/2021: Hemoglobin 14.1; Platelets 275  ?Recent Lipid Panel ?   ?Component Value Date/Time  ? CHOL 224 (H) 06/16/2021 1141  ? TRIG 77 06/16/2021 1141  ? HDL 67 06/16/2021 1141  ? CHOLHDL 3.3 06/16/2021 1141  ? LDLCALC 144 (H) 06/16/2021 1141  ?  ? ?Physical Exam:   ? ?VS:  BP 126/88   Pulse 82   Ht '5\' 3"'  (1.6 m)   Wt 245 lb 6.4 oz (111.3 kg)   SpO2 98%   BMI 43.47 kg/m?    ? ?Wt Readings from Last 3 Encounters:  ?09/06/21 245 lb 6.4 oz (111.3 kg)  ?07/13/21 248 lb 3.2 oz (112.6 kg)  ?10/28/20 236 lb (107 kg)  ?  ? ?GEN: Well nourished, well developed in no acute distress ?HEENT: Normal ?NECK: No JVD; No carotid bruits ?LYMPHATICS: No lymphadenopathy ?CARDIAC: regular rhythm, normal S1 and S2, no murmurs, rubs, gallops. Radial and DP pulses 2+ bilaterally. ?RESPIRATORY:  Clear to auscultation without rales, wheezing or rhonchi  ?ABDOMEN: Soft, non-tender, non-distended ?MUSCULOSKELETAL:  No edema; No deformity  ?SKIN: Warm and dry ?NEUROLOGIC:   Alert and oriented x 3 ?PSYCHIATRIC:  Normal affect  ? ?ASSESSMENT:   ? ?1. Atypical chest pain   ?2. Class 3 severe obesity due to excess calories without serious comorbidity with body mass index (BMI) of 40.0 to

## 2021-09-08 ENCOUNTER — Encounter (HOSPITAL_BASED_OUTPATIENT_CLINIC_OR_DEPARTMENT_OTHER): Payer: Self-pay | Admitting: Cardiology

## 2021-09-09 ENCOUNTER — Other Ambulatory Visit (HOSPITAL_COMMUNITY): Payer: Self-pay

## 2021-09-13 ENCOUNTER — Other Ambulatory Visit (HOSPITAL_COMMUNITY): Payer: Self-pay

## 2021-09-14 ENCOUNTER — Telehealth: Payer: Self-pay

## 2021-09-14 NOTE — Telephone Encounter (Signed)
Prior authorization for [facility] faxed to patient's insurance company via [email or web address]. ?Awaiting approval. ? ?CMM Key: [id number]  ? ?

## 2021-09-15 ENCOUNTER — Encounter: Payer: Self-pay | Admitting: Physical Therapy

## 2021-09-18 ENCOUNTER — Encounter: Payer: Self-pay | Admitting: Physical Therapy

## 2021-09-19 ENCOUNTER — Encounter: Payer: 59 | Attending: Physician Assistant | Admitting: Physical Therapy

## 2021-09-19 ENCOUNTER — Encounter: Payer: Self-pay | Admitting: Physical Therapy

## 2021-09-19 DIAGNOSIS — R278 Other lack of coordination: Secondary | ICD-10-CM | POA: Diagnosis not present

## 2021-09-19 DIAGNOSIS — M6281 Muscle weakness (generalized): Secondary | ICD-10-CM | POA: Insufficient documentation

## 2021-09-19 DIAGNOSIS — Z79899 Other long term (current) drug therapy: Secondary | ICD-10-CM | POA: Diagnosis not present

## 2021-09-19 DIAGNOSIS — K5904 Chronic idiopathic constipation: Secondary | ICD-10-CM | POA: Insufficient documentation

## 2021-09-19 DIAGNOSIS — R7989 Other specified abnormal findings of blood chemistry: Secondary | ICD-10-CM | POA: Diagnosis not present

## 2021-09-19 NOTE — Therapy (Signed)
[place] ?[facility]patient [facility] at [facility] ?[address], [address] ?[place], [place], [address] ?Phone: [phone]   Fax:  [phone] ? ?Patient Details  ?Name: Shannon Gould ?MRN: [id number] ?Date of Birth: January 25, 1976 ?Referring Provider:  Vladimir Crofts, PA-C ? ?Encounter Date: 09/19/2021 ? ?OUTPATIENT PHYSICAL THERAPY TREATMENT NOTE ? ? ?Patient Name: Shannon Gould ?MRN: [id number] ?DOB:July 30, 1975, 46 y.o., female ?Today's Date: 09/19/2021 ? ?PCP: Sharilyn Sites, MD ?REFERRING PROVIDER: Vladimir Crofts, PA-C ? ?END OF SESSION:  ? PT End of Session - 09/19/21 1219   ? ? Visit Number 3   ? Date for PT Re-Evaluation 10/27/21   ? Authorization Type [facility]   ? Authorization - Number of Visits 3   ? Progress Note Due on Visit 25   ? PT Start Time 1130   ? PT Stop Time 7341   ? PT Time Calculation (min) 45 min   ? Activity Tolerance Patient tolerated treatment well   ? Behavior During Therapy [facility] for tasks assessed/performed   ? ?  ?  ? ?  ? ? ?Past Medical History:  ?Diagnosis Date  ? Anemia   ? hx of patient reports   ? Anxiety   ? Asthma   ? Depression   ? GERD (gastroesophageal reflux disease)   ? Head ache   ? ?Past Surgical History:  ?Procedure Laterality Date  ? CHOLECYSTECTOMY    ? transvaginal mesh    ? ?Patient Active Problem List  ? Diagnosis Date Noted  ? Migraines 10/11/2020  ? GERD (gastroesophageal reflux disease)   ? Presenile dementia with depressive features ([place]) 04/04/2018  ? GAD (generalized anxiety disorder) 04/04/2018  ? Insomnia 04/04/2018  ? Major depressive disorder, recurrent episode, moderate ([place]) 03/28/2018  ? Major depressive disorder, recurrent severe without psychotic features ([place]) 10/31/2016  ? ? ?REFERRING DIAG: K59.04 Chronic idiopathic constipation ? ?THERAPY DIAG:  ?Muscle weakness (generalized) ? ?Other lack of coordination ? ?PERTINENT HISTORY: none ? ?PRECAUTIONS: none ? ?SUBJECTIVE: I am able to hold the urine to get to the toilet now. No  pain with bowel movements. Not straining to have the bowel movement. I am going 1-2 times per week. No stool leakage due to cutting back on the miralax. No urinary leakage since last visit.  ? ?PAIN:  ?Are you having pain? No ? ?PATIENT GOALS Be able to control urgency with urination and not we herself and work with constipation.  ? ?OBJECTIVE:  ? ?DIAGNOSTIC FINDINGS:  ?none ? ? ?COGNITION: ? Overall cognitive status: Within functional limits for tasks assessed   ?  ? ?POSTURE:  ?Good ? ?PALPATION: ?Internal Pelvic Floor anally-tightness in the anococcygeal ligament, no movement of the puborectalis, tender in the iliococcygeus. Vaginally-tightness in the post. Vaginal canal and left side tighter than right.  ? ?External Perineal Exam tightness along the external anal area.  ? ?GENERAL tightness in the lower abdomen. Pain in the right lower quadrant.  ? ?LUMBARAROM/PROM ? ?A/PROM A/PROM  ?08/28/2021  ?Flexion Full with fascial tightness in the lumbar  ?Extension Decreased by 50%  ?Right lateral flexion Full  ?Left lateral flexion full  ?Right rotation Decreased by 25%  ?Left rotation Decreased by 25%  ? (Blank rows = not tested) ? ? ?LE MMT: ? ?MMT Right ?08/28/2021 Left ?08/28/2021  ?Hip flexion    ?Hip extension 4/5 4/5  ?Hip abduction 4/5 4/5  ?Hip adduction 4/5 4/5  ?Hip internal rotation 4/5 4/5  ?Hip external rotation 4/5 4/5  ? ?  PELVIC MMT: ?  ?MMT  ?08/28/2021  ?Vaginal 2/5 with no hug, more contraction ant. And post  ?Internal Anal Sphincter 2/5 ant, 1/5 post  ?External Anal Sphincter 2/5 ant. 1/5 post  ?Puborectalis 0/5  ?(Blank rows = not tested) ? ? ?TONE: ?Increased.  ? ?PROLAPSE: ?Anterior wall  ? ? ?TODAY'S TREATMENT   ?09/19/2021 ?Manual: ?Soft tissue mobilization: circular massage to promote peristalic motion ?Myofascial release: release along the umbilicus and suprapubically, release along the sac of doughlas ?Exercises: ?Stretches/mobility:Happy baby in sitting holding for 30 sec ?Hamstring stretch in  sitting holding for 30 sec, right, left ?Piriformis stretch hold for 30 sec, right, left ?Strengthening: Supine bridge with knees going outward with red band around thighs ?Supine marching with red band around thighs and abdominal engagement ?Therapeutic activities: ?Functional strengthening activities: ?Self-care: ? ?  ? ?08/28/2021 ?Therapeutic exercise: happy baby 30 sec; hamstring bil. 30 sec supine; piriformis bil. 30 sec. ; abdominal bracing holding for 5 sec with tactile cues. Bridges 10 x with some difficulty going fully through the range.  ?Hip adduction 40# bil. 10x each side; bil. Hip extension 40# 10x each;  ?Manual: circular massage to abdomen to improve peristalic motion of intestines, fascial release of the lower abdomen to lift up the intestines off the bladder and release around the sac of douglas.  ? ? ? ?PATIENT EDUCATION:  ?09/19/2021 ?Education details: Access Code: [email or web address], educated patient on how to massage her lower abdomen by pulling upward ?Person educated: Patient ?Education method: Explanation, demonstration, handout, tactile cues ?Education comprehension: verbalized understanding; return demonstration ? ? ?HOME EXERCISE PROGRAM: ?09/19/2021 ?Access Code: [email or web address] ?URL: [email or web address] ?Date: 09/19/2021 ?Prepared by: Earlie Counts ? ?Exercises ?- Supine Hamstring Stretch  - 1 x daily - 3 x weekly - 1 sets - 2 reps - 30 sec hold ?- Supine Piriformis Stretch with Foot on Ground  - 1 x daily - 3 x weekly - 1 sets - 2 reps - 30 sec hold ?- Happy Baby with Pelvic Floor Lengthening  - 1 x daily - 3 x weekly - 1 sets - 1 reps - 30 sec  hold ?- Bridge with Abduction and Resistance Loop  - 1 x daily - 3 x weekly - 1 sets - 10 reps ?- Supine March with Resistance Band  - 1 x daily - 3 x weekly - 2 sets - 10 reps ?ASSESSMENT: ? ?CLINICAL IMPRESSION: ?Patient is a 47 y.o. female who was seen today for physical therapy treatment for chronic constipation and urinary leakage with  urgency. Patient is not having urgency with urine. She is able to walk to the bathroom without leakage. She is having 1-2 bowel movements per week and is not straining.  Patient would benefit from skilled therapy to improve pelvic floor coordination to improve constipation and reduce urgency.  ? ? ?OBJECTIVE IMPAIRMENTS decreased coordination, weakness, pain  ? ?ACTIVITY LIMITATIONS toileting.  ? ?PERSONAL FACTORS Age, Fitness, Past/current experiences, and 1 comorbidity: bladder tack surgery are also affecting patient's functional outcome.  ? ?MD signed initial eval.  ?REHAB POTENTIAL: Good ? ?CLINICAL DECISION MAKING: Stable/uncomplicated ? ?EVALUATION COMPLEXITY: Low ? ? ?GOALS: ?Goals reviewed with patient? Yes ? ?SHORT TERM GOALS: ? ?Patient independent with initial HEP with pelvic floor relaxation.  ?Target date: 09/25/2021 ?Goal status: INITIAL ? ?2.  Patient understand how to perform manual mobilization to improve peristalic motion of intestines.  ?Target date: 09/25/2021 ?Goal status: met ? ?LONG TERM GOALS: ? ?Patient is independent with advanced HEP to improve  pelvic floor strength to reduce urgency.  ?Target date: 09/25/2021 ? and 11/20/2021 ?Goal status: INITIAL ? ?2.  Patient able to have 2 bowel movements per week due to increased relaxation of the pelvic floor.  ?1-2 BM per week  ?Target date: 11/20/2021 ?Goal status: INITIAL ? ?3.  Patient able to walk to the bathroom without leaking urine due to improved pelvic floor endurance and coordination.  ?Target date: 11/20/2021 ?Goal status: INITIAL ? ?4.  Patient able to urinate every 2-3 hours due to improve pelvic floor endurance.  ?Target date: 11/20/2021 ?Goal status: INITIAL ? ? ? ?PLAN: ?PT FREQUENCY: 1x/week ? ?PT DURATION: 12 weeks ? ?PLANNED INTERVENTIONS: Therapeutic exercises, Therapeutic activity, Neuromuscular re-education, Patient/Family education, Joint mobilization, and Dry Needling, manual mobilization, taping, biofeedback, moist heat,  cryotherapy ? ?PLAN FOR NEXT SESSION:  work on back tightness, work on diaphragmatic breathing to relax the pelvic floor ? ?Earlie Counts, PT ?09/19/21 12:21 PM ? ?[place] ?[facility] at [place] [facility]

## 2021-09-20 NOTE — Progress Notes (Deleted)
NEUROLOGY FOLLOW UP OFFICE NOTE  Shannon Gould [id number]  Assessment/Plan:   Migraine without aura, without status migrainosus, not intractable   1.  Migraine prevention:  Aimovig 140mg  Q4w 2.  Migraine rescue:  Relpax 40mg .  I will also give her Nurtec to try - I asked her to try it as a first line (before eletriptan) and as a second line (after eletriptan).  If either is effective, she will contact me for prescription. 3.  Limit use of pain relievers to no more than 2 days out of week to prevent risk of rebound or medication-overuse headache. 4.  Keep headache diary 5.  Follow up 6 months.   Subjective:  Shannon Gould is a 46 year old left-handed female with generalized anxiety disorder and major depression who follows up for migraines.   UPDATE: Had an intractable migraine in December that responded to prednisone.  55 for rescue.  *** Intensity:  Mild Duration:  Severe for 2 hours but dull for rest of day.   Frequency:  No migraines since October However, she had 2 to 3 migraines this past week.  She just took her last shot on 4/5.  Unsure if change in temperature and seasons a factor.   Current NSAIDS:  Ibuprofen 600mg , naproxen Current analgesics: none Current triptans: Relpax 40 mg Current ergotamine: None Current anti-emetic:  none Current muscle relaxants: None Current anti-anxiolytic: Xanax Current sleep aide: Ambien Current Antihypertensive medications: None Current Antidepressant medications: Venlafaxine XL 225 mg daily, mirtapamine PRN QHS, Abilify Current Anticonvulsant medications: none Current anti-CGRP: Aimovig 140mg   Current Vitamins/Herbal/Supplements: Vitamin D Current Antihistamines/Decongestants: None Other therapy: None Hormone/birth control: Mirena   Caffeine:  no Alcohol:  occasional Smoker:  no Diet:  hydrates Exercise:  no Depression:  yes; Anxiety:  Yes.  She sees a November for therapy. Other pain:  Suboccipital  pain Sleep hygiene:  Insomnia.  Uses sonata and weighted blanket.   HISTORY:  Onset:  20s Location:  Usually left sided Quality:  pounding Initial Intensity:  6/10.  She denies new headache, thunderclap headache or severe headache that wakes her from sleep. Aura:  no Prodrome:  no Postdrome:  no Associated symptoms:  Nausea, photophobia, phonophobia, blurred vision, left eye droops, osmophobia.  She denies associated vomiting or unilateral numbness or weakness. Initial Duration:  1 day Initial Frequency:  Once a month Initial Frequency of abortive medication: once or twice a month Triggers:  Caffeine Relieving factors:  Laying down Activity:  Aggravates She also reported near daily dull bifrontal headaches which she treats with ibuprofen 600mg .   CT of head from 01/25/2015 was personally reviewed and revealed no acute intracranial abnormalities.   Past NSAIDS: Ibuprofen, naproxen Past analgesics:  Tylenol, Excedrin, Ultracet Past abortive triptans: Maxalt 5 mg, Sumatriptan tablet (caused nausea) Past abortive ergotamine: none Past muscle relaxants:  none Past anti-emetic:  none Past antihypertensive medications:  none Past antidepressant medications: Wellbutrin, Celexa, Zoloft 100 mg Past anticonvulsant medications:  Topiramate ER (cognitive problems) Past anti-CGRP:  none Past vitamins/Herbal/Supplements:  none Past antihistamines/decongestants:  none Other past therapies:  none  PAST MEDICAL HISTORY: Past Medical History:  Diagnosis Date   Anemia    hx of patient reports    Anxiety    Asthma    Depression    GERD (gastroesophageal reflux disease)    Head ache     MEDICATIONS: Current Outpatient Medications on File Prior to Visit  Medication Sig Dispense Refill   albuterol (VENTOLIN HFA) 108 (90 Base)  MCG/ACT inhaler Inhale 2 puffs into the lungs every 6 (six) hours as needed. For shortness of breath     ARIPiprazole (ABILIFY) 20 MG tablet TAKE 1 TABLET BY MOUTH  ONCE DAILY 90 tablet 1   b complex vitamins tablet Take 1 tablet by mouth daily. 30 tablet 11   busPIRone (BUSPAR) 15 MG tablet Take 1 tablet (15 mg total) by mouth 2 (two) times daily. 180 tablet 1   Dihydroergotamine Mesylate HFA (TRUDHESA) 0.725 MG/ACT AERS Place 1 spray into the nose as directed. Place 1 spray into each nostril.  May repeat once after 1 hour.  Maximum 2 doses in 24 hours. 8 mL 5   Erenumab-aooe (AIMOVIG) 140 MG/ML SOAJ Inject 140 mg (1 pen) as directed every 28 days. 1 mL 0   furosemide (LASIX) 40 MG tablet Take 40 mg by mouth daily as needed.      levonorgestrel (MIRENA, 52 MG,) 20 MCG/24HR IUD 1 each by Intrauterine route once.      LORazepam (ATIVAN) 1 MG tablet Take 1-2 tablets (1-2 mg total) by mouth every 8 (eight) hours as needed for anxiety. 60 tablet 1   Omega-3 Fatty Acids (FISH OIL PO) Take by mouth.     polyethylene glycol (MIRALAX) 17 g packet Take 17 g by mouth daily. 30 each 3   Semaglutide-Weight Management (WEGOVY) 0.5 MG/0.5ML SOAJ Inject 0.5 mg into the skin once a week. 2 mL 1   Semaglutide-Weight Management (WEGOVY) 1 MG/0.5ML SOAJ Inject 1 pen (1 mg)  into the skin once a week. Take after you have done 4 weekly injections of the 0.5 mg dose (this is the third step) 2 mL 1   venlafaxine XR (EFFEXOR-XR) 150 MG 24 hr capsule TAKE 1 CAPSULE BY MOUTH DAILY WITH BREAKFAST 90 capsule 1   venlafaxine XR (EFFEXOR-XR) 75 MG 24 hr capsule TAKE 1 CAPSULE BY MOUTH DAILY WITH BREAKFAST. TAKE WITH 150 MG TO EQUAL A DOSE OF  225 MG. 90 capsule 1   Vitamin D, Ergocalciferol, (DRISDOL) 1.25 MG (50000 UNIT) CAPS capsule Take 1 capsule (50,000 Units total) by mouth every 7 (seven) days. 12 capsule 0   Wheat Dextrin (BENEFIBER) POWD Use daily as directed 529 g 0   zolpidem (AMBIEN) 10 MG tablet Take 1 tablet (10 mg total) by mouth at bedtime as needed for sleep. 30 tablet 5   No current facility-administered medications on file prior to visit.    ALLERGIES: Allergies   Allergen Reactions   Hydrocodone Itching   Lexapro [Escitalopram] Other (See Comments)    'Knot in throat w/ trouble swallowing.'   Wellbutrin [Bupropion]    Caffeine Other (See Comments)    Headache    Lactose Intolerance (Gi) Other (See Comments)    gas    FAMILY HISTORY: Family History  Problem Relation Age of Onset   Hypertension Mother    Diabetes Mother    Hypertension Father    Heart disease Father    Sleep apnea Father    Diabetes Father    Heart attack Father        x2   CAD Father    Hyperlipidemia Father    Heart disease Sister    Anemia Sister    Hypertension Sister    Heart disease Brother    Hypertension Brother    Asthma Maternal Grandmother    Hypertension Paternal Grandmother    Diabetes Paternal Grandmother    Seizures Paternal Grandmother    Diabetes Paternal ActorGrandfather  Anxiety disorder Daughter    Depression Daughter    Asthma Daughter    Asthma Son    Asthma Son       Objective:  *** General: No acute distress.  Patient appears well-groomed.   Head:  Normocephalic/atraumatic Eyes:  Fundi examined but not visualized Neck: supple, no paraspinal tenderness, full range of motion Heart:  Regular rate and rhythm Lungs:  Clear to auscultation bilaterally Back: No paraspinal tenderness Neurological Exam: alert and oriented to person, place, and time.  Speech fluent and not dysarthric, language intact.  CN II-XII intact. Bulk and tone normal, muscle strength 5/5 throughout.  Sensation to light touch intact.  Deep tendon reflexes 2+ throughout, toes downgoing.  Finger to nose testing intact.  Gait normal, Romberg negative.   Shon Millet, DO  CC: Assunta Found, MD

## 2021-09-21 ENCOUNTER — Other Ambulatory Visit (HOSPITAL_COMMUNITY): Payer: Self-pay

## 2021-09-22 NOTE — Progress Notes (Signed)
? ?NEUROLOGY FOLLOW UP OFFICE NOTE ? ?Shannon Gould ?[id number] ? ?Assessment/Plan:  ? ?Migraine without aura, without status migrainosus, not intractable ?  ?1.  Migraine prevention:  Aimovig 140mg   ?2.  Migraine rescue:  Trudhesa NS ?3.  Limit use of pain relievers to no more than 2 days out of week to prevent risk of rebound or medication-overuse headache. ?4.  Keep headache diary ?5.  Follow up one year. ?  ?Subjective:  ?Shannon Gould is a 46 year old left-handed female with generalized anxiety disorder and major depression who follows up for migraines. ?  ?UPDATE: ?Her insurance would no longer approve eletriptan, which was effective.  Nurtec ineffective.  She tried [place] which is effective.   ?Intensity:  Mild ?Duration:  Severe for 2 hours but dull for rest of day.   ?Frequency:  Usually once a month ?However, she had 2 to 3 migraines this past week.  She just took her last shot on 4/5.  Unsure if change in temperature and seasons a factor.   ?Current NSAIDS:  Ibuprofen 600mg , naproxen ?Current analgesics: none ?Current triptans: none ?Current ergotamine: Trudhesa NS ?Current anti-emetic:  none ?Current muscle relaxants: None ?Current anti-anxiolytic: Xanax ?Current sleep aide: Ambien ?Current Antihypertensive medications: None ?Current Antidepressant medications: Venlafaxine XL 225 mg daily, mirtapamine PRN QHS, Abilify ?Current Anticonvulsant medications: none ?Current anti-CGRP: Aimovig 140mg   ?Current Vitamins/Herbal/Supplements: Vitamin D ?Current Antihistamines/Decongestants: None ?Other therapy: None ?Hormone/birth control: Mirena ?  ?Caffeine:  no ?Alcohol:  occasional ?Smoker:  no ?Diet:  hydrates ?Exercise:  no ?Depression:  yes; Anxiety:  Yes.  She sees a Social worker for therapy. ?Other pain:  Suboccipital pain ?Sleep hygiene:  Insomnia.  Uses sonata and weighted blanket. ?  ?HISTORY:  ?Onset:  60s ?Location:  Usually left sided ?Quality:  pounding ?Initial Intensity:  6/10.  She denies new  headache, thunderclap headache or severe headache that wakes her from sleep. ?Aura:  no ?Prodrome:  no ?Postdrome:  no ?Associated symptoms:  Nausea, photophobia, phonophobia, blurred vision, left eye droops, osmophobia.  She denies associated vomiting or unilateral numbness or weakness. ?Initial Duration:  1 day ?Initial Frequency:  Once a month ?Initial Frequency of abortive medication: once or twice a month ?Triggers:  Caffeine ?Relieving factors:  Laying down ?Activity:  Aggravates ?She also reported near daily dull bifrontal headaches which she treats with ibuprofen 600mg . ?  ?CT of head from 01/25/2015 was personally reviewed and revealed no acute intracranial abnormalities. ?  ?Past NSAIDS: Ibuprofen, naproxen ?Past analgesics:  Tylenol, Excedrin, Ultracet ?Past abortive triptans: Maxalt 5 mg, Sumatriptan tablet (caused nausea) ?Past abortive ergotamine: none ?Past muscle relaxants:  none ?Past anti-emetic:  none ?Past antihypertensive medications:  none ?Past antidepressant medications: Wellbutrin, Celexa, Zoloft 100 mg ?Past anticonvulsant medications:  Topiramate ER (cognitive problems) ?Past anti-CGRP:  none ?Past vitamins/Herbal/Supplements:  none ?Past antihistamines/decongestants:  none ?Other past therapies:  none ? ?PAST MEDICAL HISTORY: ?Past Medical History:  ?Diagnosis Date  ? Anemia   ? hx of patient reports   ? Anxiety   ? Asthma   ? Depression   ? GERD (gastroesophageal reflux disease)   ? Head ache   ? ? ?MEDICATIONS: ?Current Outpatient Medications on File Prior to Visit  ?Medication Sig Dispense Refill  ? albuterol (VENTOLIN HFA) 108 (90 Base) MCG/ACT inhaler Inhale 2 puffs into the lungs every 6 (six) hours as needed. For shortness of breath    ? ARIPiprazole (ABILIFY) 20 MG tablet TAKE 1 TABLET BY MOUTH ONCE DAILY 90 tablet  1  ? b complex vitamins tablet Take 1 tablet by mouth daily. 30 tablet 11  ? busPIRone (BUSPAR) 15 MG tablet Take 1 tablet (15 mg total) by mouth 2 (two) times daily.  180 tablet 1  ? Dihydroergotamine Mesylate HFA (TRUDHESA) 0.725 MG/ACT AERS Place 1 spray into the nose as directed. Place 1 spray into each nostril.  May repeat once after 1 hour.  Maximum 2 doses in 24 hours. 8 mL 5  ? Erenumab-aooe (AIMOVIG) 140 MG/ML SOAJ Inject 140 mg (1 pen) as directed every 28 days. 1 mL 0  ? furosemide (LASIX) 40 MG tablet Take 40 mg by mouth daily as needed.     ? levonorgestrel (MIRENA, 52 MG,) 20 MCG/24HR IUD 1 each by Intrauterine route once.     ? LORazepam (ATIVAN) 1 MG tablet Take 1-2 tablets (1-2 mg total) by mouth every 8 (eight) hours as needed for anxiety. 60 tablet 1  ? Omega-3 Fatty Acids (FISH OIL PO) Take by mouth.    ? polyethylene glycol (MIRALAX) 17 g packet Take 17 g by mouth daily. 30 each 3  ? Semaglutide-Weight Management (WEGOVY) 0.5 MG/0.5ML SOAJ Inject 0.5 mg into the skin once a week. 2 mL 1  ? Semaglutide-Weight Management (WEGOVY) 1 MG/0.5ML SOAJ Inject 1 pen (1 mg)  into the skin once a week. Take after you have done 4 weekly injections of the 0.5 mg dose (this is the third step) 2 mL 1  ? venlafaxine XR (EFFEXOR-XR) 150 MG 24 hr capsule TAKE 1 CAPSULE BY MOUTH DAILY WITH BREAKFAST 90 capsule 1  ? venlafaxine XR (EFFEXOR-XR) 75 MG 24 hr capsule TAKE 1 CAPSULE BY MOUTH DAILY WITH BREAKFAST. TAKE WITH 150 MG TO EQUAL A DOSE OF  225 MG. 90 capsule 1  ? Vitamin D, Ergocalciferol, (DRISDOL) 1.25 MG (50000 UNIT) CAPS capsule Take 1 capsule (50,000 Units total) by mouth every 7 (seven) days. 12 capsule 0  ? Wheat Dextrin (BENEFIBER) POWD Use daily as directed 529 g 0  ? zolpidem (AMBIEN) 10 MG tablet Take 1 tablet (10 mg total) by mouth at bedtime as needed for sleep. 30 tablet 5  ? ?No current facility-administered medications on file prior to visit.  ? ? ?ALLERGIES: ?Allergies  ?Allergen Reactions  ? Hydrocodone Itching  ? Lexapro [Escitalopram] Other (See Comments)  ?  'Knot in throat w/ trouble swallowing.'  ? Wellbutrin [Bupropion]   ? Caffeine Other (See  Comments)  ?  Headache   ? Lactose Intolerance (Gi) Other (See Comments)  ?  gas  ? ? ?FAMILY HISTORY: ?Family History  ?Problem Relation Age of Onset  ? Hypertension Mother   ? Diabetes Mother   ? Hypertension Father   ? Heart disease Father   ? Sleep apnea Father   ? Diabetes Father   ? Heart attack Father   ?     x2  ? CAD Father   ? Hyperlipidemia Father   ? Heart disease Sister   ? Anemia Sister   ? Hypertension Sister   ? Heart disease Brother   ? Hypertension Brother   ? Asthma Maternal Grandmother   ? Hypertension Paternal Grandmother   ? Diabetes Paternal Grandmother   ? Seizures Paternal Grandmother   ? Diabetes Paternal Grandfather   ? Anxiety disorder Daughter   ? Depression Daughter   ? Asthma Daughter   ? Asthma Son   ? Asthma Son   ? ? ?  ?Objective:  ?Blood pressure (!) 143/86,  pulse 96, height 5\' 3"  (1.6 m), weight 241 lb 9.6 oz (109.6 kg), SpO2 98 %. ?General: No acute distress.  Patient appears well-groomed.   ?Head:  Normocephalic/atraumatic ?Eyes:  Fundi examined but not visualized ?Neck: supple, no paraspinal tenderness, full range of motion ?Heart:  Regular rate and rhythm ?Neurological Exam: alert and oriented to person, place, and time.  Speech fluent and not dysarthric, language intact.  CN II-XII intact. Bulk and tone normal, muscle strength 5/5 throughout.  Sensation to light touch intact.  Deep tendon reflexes 2+ throughout.  Finger to nose testing intact.  Gait normal, Romberg negative. ? ? ?Metta Clines, DO ? ?CC: Sharilyn Sites, MD ? ? ? ? ? ? ?

## 2021-09-23 ENCOUNTER — Other Ambulatory Visit (HOSPITAL_COMMUNITY): Payer: Self-pay

## 2021-09-25 ENCOUNTER — Encounter: Payer: Self-pay | Admitting: Neurology

## 2021-09-25 ENCOUNTER — Ambulatory Visit: Payer: 59 | Admitting: Neurology

## 2021-09-25 ENCOUNTER — Encounter (HOSPITAL_BASED_OUTPATIENT_CLINIC_OR_DEPARTMENT_OTHER): Payer: Self-pay

## 2021-09-25 ENCOUNTER — Other Ambulatory Visit (HOSPITAL_COMMUNITY): Payer: Self-pay

## 2021-09-25 VITALS — BP 143/86 | HR 96 | Ht 63.0 in | Wt 241.6 lb

## 2021-09-25 DIAGNOSIS — G43009 Migraine without aura, not intractable, without status migrainosus: Secondary | ICD-10-CM

## 2021-09-25 MED ORDER — TRUDHESA 0.725 MG/ACT NA AERS: 1.0000 | INHALATION_SPRAY | NASAL | 5 refills | Status: DC

## 2021-09-25 MED ORDER — AIMOVIG 140 MG/ML ~~LOC~~ SOAJ
140.0000 mg | SUBCUTANEOUS | 5 refills | Status: DC
  Filled 2021-09-25 – 2021-10-11 (×2): qty 1, 28d supply, fill #0
  Filled 2021-11-07: qty 1, 28d supply, fill #1
  Filled 2021-12-06: qty 1, 28d supply, fill #2
  Filled 2022-01-11: qty 1, 28d supply, fill #3
  Filled 2022-02-01: qty 1, 28d supply, fill #4
  Filled 2022-03-04: qty 1, 28d supply, fill #5

## 2021-09-25 NOTE — Patient Instructions (Signed)
Aimovig refilled at [facility] and [facility] at [facility] ?Follow up one year ?

## 2021-09-25 NOTE — Telephone Encounter (Signed)
-  Received a denial letter for [employer]. ?-Appeal letter faxed to [employer] ?

## 2021-09-26 ENCOUNTER — Telehealth: Payer: Self-pay | Admitting: Physician Assistant

## 2021-09-26 LAB — VITAMIN D 1,25 DIHYDROXY
Vitamin D 1, 25 (OH)2 Total: 72 pg/mL — ABNORMAL HIGH
Vitamin D2 1, 25 (OH)2: 59 pg/mL
Vitamin D3 1, 25 (OH)2: 13 pg/mL

## 2021-09-26 NOTE — Progress Notes (Signed)
Requested that test be added. They will send fax to sign if they have enough specimen or send a fax letting us know they don't have enough specimen to add it on.  ?

## 2021-09-26 NOTE — Progress Notes (Signed)
Please contact [facility]. I must have made a mistake when ordering this test, it should be 25 Hydroxyvitamin D level, no dihydroxy. Can they do the correct one without having the pt get drawn again?  ?Thanks

## 2021-09-26 NOTE — Telephone Encounter (Signed)
Patient called in regarding [place] from [facility]. She states she didn't "receive them yet." Pls rtc [phone] ?

## 2021-09-26 NOTE — Telephone Encounter (Signed)
Received fax from [facility] (put in your box) that there is no longer any sample available.  ?

## 2021-09-26 NOTE — Telephone Encounter (Signed)
Please let her know I made a mistake on the order of vitamin D level.  There are 2 different tests and I ordered the wrong one.  The results are good, but I can't give her a number of how much better she is b/c the tests aren't equivalent. Please apologize for me. ?Have her take Vit D 2,000 iu OTC only and when we see each other in July, I'll re-test it. ?Thank you.

## 2021-09-27 ENCOUNTER — Other Ambulatory Visit (HOSPITAL_COMMUNITY): Payer: Self-pay

## 2021-09-27 ENCOUNTER — Encounter (HOSPITAL_BASED_OUTPATIENT_CLINIC_OR_DEPARTMENT_OTHER): Payer: Self-pay

## 2021-09-27 NOTE — Telephone Encounter (Signed)
Patient said it was ok. Recommendations provided.  ?

## 2021-09-29 ENCOUNTER — Encounter: Payer: Self-pay | Admitting: Physical Therapy

## 2021-09-29 ENCOUNTER — Encounter (HOSPITAL_BASED_OUTPATIENT_CLINIC_OR_DEPARTMENT_OTHER): Payer: Self-pay

## 2021-09-29 ENCOUNTER — Ambulatory Visit: Payer: 59 | Attending: Physician Assistant | Admitting: Physical Therapy

## 2021-09-29 DIAGNOSIS — R278 Other lack of coordination: Secondary | ICD-10-CM

## 2021-09-29 DIAGNOSIS — M6281 Muscle weakness (generalized): Secondary | ICD-10-CM

## 2021-09-29 NOTE — Therapy (Addendum)
[facility] @ [address] [place], [place], [address] Phone: [phone]   Fax:  [phone]  Patient Details  Name: Shannon Gould MRN: [id number] Date of Birth: 10/20/1975 Referring Provider:  Vladimir Crofts, PA-C  Encounter Date: 09/29/2021  OUTPATIENT PHYSICAL THERAPY TREATMENT NOTE   Patient Name: Shannon Gould MRN: [id number] DOB:24-Sep-1975, 46 y.o., female Today's Date: 09/29/2021  PCP: Sharilyn Sites, MD REFERRING PROVIDER: Vladimir Crofts, PA-C  END OF SESSION:   PT End of Session - 09/29/21 0933     Visit Number 4    Date for PT Re-Evaluation 10/27/21    Authorization Type [employer]    Authorization - Number of Visits 4    Progress Note Due on Visit 25    PT Start Time 0930    PT Stop Time 1008    PT Time Calculation (min) 38 min    Activity Tolerance Patient tolerated treatment well    Behavior During Therapy [facility] for tasks assessed/performed             Past Medical History:  Diagnosis Date   Anemia    hx of patient reports    Anxiety    Asthma    Depression    GERD (gastroesophageal reflux disease)    Head ache    Past Surgical History:  Procedure Laterality Date   CHOLECYSTECTOMY     transvaginal mesh     Patient Active Problem List   Diagnosis Date Noted   Migraines 10/11/2020   GERD (gastroesophageal reflux disease)    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 I am only going 2 times per week.  I am going to the bathroom every 3 days so 2 times per week.  Patient confirms identification and approves PT to assess pelvic floor and treatment Yes PAIN:  Are you having pain? No PATIENT GOALS Be able to control urgency with urination and not we herself and work with constipation.   OBJECTIVE: (objective measures completed at initial evaluation unless otherwise dated)  OBJECTIVE:   DIAGNOSTIC FINDINGS:  none   COGNITION:  Overall cognitive status: Within functional limits for tasks assessed      POSTURE:  Good  PALPATION: Internal Pelvic Floor anally-tightness in the anococcygeal ligament, no movement of the puborectalis, tender in the iliococcygeus. Vaginally-tightness in the post. Vaginal canal and left side tighter than right.   External Perineal Exam tightness along the external anal area.   GENERAL tightness in the lower abdomen. Pain in the right lower quadrant.   LUMBARAROM/PROM  A/PROM A/PROM  08/28/2021  Flexion Full with fascial tightness in the lumbar  Extension Decreased by 50%  Right lateral flexion Full  Left lateral flexion full  Right rotation Decreased by 25%  Left rotation Decreased by 25%   (Blank rows = not tested)   LE MMT:  MMT Right 08/28/2021 Left 08/28/2021  Hip flexion    Hip extension 4/5 4/5  Hip abduction 4/5 4/5  Hip adduction 4/5 4/5  Hip internal rotation 4/5 4/5  Hip external rotation 4/5 4/5  PELVIC MMT:   MMT  08/28/2021 09/29/2021  Vaginal 2/5 with no hug, more contraction ant. And post   Internal Anal Sphincter 2/5 ant, 1/5 post 3/5  External Anal Sphincter 2/5 ant. 1/5 post 3/5  Puborectalis 0/5 3/5  (Blank rows = not tested)   TONE: Increased.   PROLAPSE: Anterior wall    TODAY'S TREATMENT   09/29/2021 Manual: Soft tissue mobilization: Scar tissue mobilization: Myofascial release: Spinal mobilization: Internal pelvic floor techniques:manual work rectally to the puborectalis, iliococcygeus, sphincter  muscles Dry needling: Neuromuscular re-education: Core retraining: Core facilitation: Form correction: Pelvic floor contraction training:diaphragmatic breathing with bulging of the pelvic floor to push the therapist finger out of the rectum 1 out of 3 times; rectal contraction with tactile cues to contract and lift.  Down training: Exercises: Stretches/mobility:hamstring stretch bil. Holding 30 sec Piriformis stretch bil. Holding 30 sec Trunk rotation bil. Holding 30 sec  Strengthening: Therapeutic activities: Functional strengthening activities:education on correct toileting with bulging of the pelvic floor and pushing the stool out Self-care:     09/19/2021 Manual: Soft tissue mobilization: circular massage to promote peristalic motion Myofascial release: release along the umbilicus and suprapubically, release along the sac of doughlas Exercises: Stretches/mobility:Happy baby in sitting holding for 30 sec Hamstring stretch in sitting holding for 30 sec, right, left Piriformis stretch hold for 30 sec, right, left Strengthening: Supine bridge with knees going outward with red band around thighs Supine marching with red band around thighs and abdominal engagement Therapeutic activities: Functional strengthening activities: Self-care:     08/28/2021 Therapeutic exercise: happy baby 30 sec; hamstring bil. 30 sec supine; piriformis bil. 30 sec. ; abdominal bracing holding for 5 sec with tactile cues. Bridges 10 x with some difficulty going fully through the range.  Hip adduction 40# bil. 10x each side; bil. Hip extension 40# 10x each;  Manual: circular massage to abdomen to improve peristalic motion of intestines, fascial release of the lower abdomen to lift up the intestines off the bladder and release around the sac of douglas.     PATIENT EDUCATION:  09/19/2021 Education details: Access Code: [email or web address], educated patient on how to massage her lower abdomen by pulling  upward Person educated: Patient Education method: Explanation, demonstration, handout, tactile cues Education comprehension: verbalized understanding; return demonstration   HOME EXERCISE PROGRAM: 09/19/2021 Access Code: [email or web address] URL: [email or web address] Date: 09/19/2021 Prepared by: Earlie Counts  Exercises - Supine Hamstring Stretch  - 1 x daily - 3 x weekly - 1 sets - 2 reps - 30 sec hold - Supine Piriformis Stretch with Foot on Ground  - 1 x daily - 3 x weekly - 1 sets - 2 reps - 30 sec hold - Happy Baby with Pelvic Floor Lengthening  - 1 x daily - 3 x weekly - 1 sets - 1 reps - 30 sec  hold - Bridge with Abduction and Resistance Loop  - 1 x daily - 3 x weekly - 1 sets - 10 reps - Supine March with Resistance Band  - 1 x daily - 3 x weekly - 2 sets - 10 reps ASSESSMENT:  CLINICAL IMPRESSION: Patient is a 46 y.o. female who was seen today for physical therapy treatment for chronic constipation and urinary leakage with urgency. Pelvic floor strength rectally is now 3/5. She has elongation of the puborectalis, anococcygeal ligament and iliococcygeus.  Patient was able to push the therapist finger out of the rectum after the third try and using the right breathing method. Patient still leaks urine  when she has the urgency. Patient would benefit from skilled therapy to improve pelvic floor coordination to improve constipation and reduce urgency.    OBJECTIVE IMPAIRMENTS decreased coordination, weakness, pain   ACTIVITY LIMITATIONS toileting.   PERSONAL FACTORS Age, Fitness, Past/current experiences, and 1 comorbidity: bladder tack surgery are also affecting patient's functional outcome.   MD signed initial eval.  REHAB POTENTIAL: Good  CLINICAL DECISION MAKING: Stable/uncomplicated  EVALUATION COMPLEXITY: Low   GOALS: Goals reviewed with patient? Yes  SHORT TERM GOALS:  Patient independent with initial HEP with pelvic floor relaxation.  Target date:  09/25/2021 Goal status: met  2.  Patient understand how to perform manual mobilization to improve peristalic motion of intestines.  Target date: 09/25/2021 Goal status: met  LONG TERM GOALS:  Patient is independent with advanced HEP to improve pelvic floor strength to reduce urgency.  Target date: 09/25/2021  and 11/20/2021 Goal status: INITIAL  2.  Patient able to have 2 bowel movements per week due to increased relaxation of the pelvic floor.  2 BM per week  Target date: 11/20/2021 Goal status: INITIAL  3.  Patient able to walk to the bathroom without leaking urine due to improved pelvic floor endurance and coordination.  Leaks a little when gets the urgency.  Target date: 11/20/2021 Goal status: INITIAL  4.  Patient able to urinate every 2-3 hours due to improve pelvic floor endurance.  Target date: 11/20/2021 Goal status: met    PLAN: PT FREQUENCY: 1x/week  PT DURATION: 12 weeks  PLANNED INTERVENTIONS: Therapeutic exercises, Therapeutic activity, Neuromuscular re-education, Patient/Family education, Joint mobilization, and Dry Needling, manual mobilization, taping, biofeedback, moist heat, cryotherapy  PLAN FOR NEXT SESSION:  work on back tightness, work on diaphragmatic breathing to relax the pelvic floor    Earlie Counts, PT 09/29/21 10:13 AM  [facility] @ [facility] [address] [place], [place], [address] Phone: [phone]   Fax:  [phone]  PHYSICAL THERAPY DISCHARGE SUMMARY  Visits from Start of Care: 4  Current functional level related to goals / functional outcomes: See above. Patient did not return after 09/29/2021.    Remaining deficits: See above.    Education / Equipment: HEP   Patient agrees to discharge. Patient goals were not met. Patient is being discharged due to not returning since the last visit. Thank you for the referral. Earlie Counts, PT 10/25/21 4:59 PM

## 2021-09-29 NOTE — Telephone Encounter (Signed)
Opened in error

## 2021-09-29 NOTE — Telephone Encounter (Signed)
Received PA key for patient, updated PA and it resulted with the message 7 refills remaining for patient.  ?

## 2021-10-02 ENCOUNTER — Encounter: Payer: Self-pay | Admitting: Physical Therapy

## 2021-10-11 ENCOUNTER — Other Ambulatory Visit (HOSPITAL_COMMUNITY): Payer: Self-pay

## 2021-10-26 ENCOUNTER — Other Ambulatory Visit (HOSPITAL_COMMUNITY): Payer: Self-pay

## 2021-10-26 ENCOUNTER — Telehealth: Payer: Self-pay | Admitting: Neurology

## 2021-10-26 DIAGNOSIS — R87810 Cervical high risk human papillomavirus (HPV) DNA test positive: Secondary | ICD-10-CM | POA: Diagnosis not present

## 2021-10-26 DIAGNOSIS — A5900 Urogenital trichomoniasis, unspecified: Secondary | ICD-10-CM | POA: Diagnosis not present

## 2021-10-26 DIAGNOSIS — N871 Moderate cervical dysplasia: Secondary | ICD-10-CM | POA: Diagnosis not present

## 2021-10-26 DIAGNOSIS — Z3202 Encounter for pregnancy test, result negative: Secondary | ICD-10-CM | POA: Diagnosis not present

## 2021-10-26 MED ORDER — METRONIDAZOLE 500 MG PO TABS
ORAL_TABLET | ORAL | 0 refills | Status: DC
  Filled 2021-10-26: qty 14, 7d supply, fill #0

## 2021-10-26 NOTE — Telephone Encounter (Signed)
[facility] for migraine cocktail, will need driver, thanks

## 2021-10-26 NOTE — Telephone Encounter (Signed)
Advised patient, please stop by the office tomorrow for a headache cocktail.  Front desk patient to call back and let us know if she can come by tomorrow.

## 2021-10-26 NOTE — Telephone Encounter (Signed)
Patient left message with access nurse stating she has had a headache for 2 days.  She took her medication and still has a headache.  Its a 5/10 and has nausea and sensitivity to light.  Access note in box.

## 2021-10-26 NOTE — Telephone Encounter (Signed)
How frequent or the headaches (on average, how many days a week/month are they occurring)?  No more intensity. Just the last two days.  How long do the headaches last?  Coming and going Verify what preventative medication and dose you are taking (e.g. topiramate, propranolol, amitriptyline, Emgality, etc)  aimovig Verify which rescue medication you are taking (triptan, Advil, Excedrin, Aleve, Ubrelvy, etc)  Trudessa How often are you taking pain relievers/analgesics/rescue mediction?  NONE. Per Patient she do not mind either Headache Cocktail or Something called in.

## 2021-11-02 ENCOUNTER — Other Ambulatory Visit (HOSPITAL_COMMUNITY): Payer: Self-pay

## 2021-11-02 NOTE — Telephone Encounter (Signed)
-  Received PA approval for Wegovy from  09/28/21-04/29/22. -Pt made aware via mychart

## 2021-11-08 ENCOUNTER — Other Ambulatory Visit (HOSPITAL_COMMUNITY): Payer: Self-pay

## 2021-11-13 ENCOUNTER — Ambulatory Visit
Admission: RE | Admit: 2021-11-13 | Discharge: 2021-11-13 | Disposition: A | Discharge status: Home / Self Care | Payer: 59 | Source: Ambulatory Visit | Attending: Family Medicine | Admitting: Family Medicine

## 2021-11-13 VITALS — BP 133/82 | HR 73 | Temp 98.2°F | Resp 18

## 2021-11-13 DIAGNOSIS — M79671 Pain in right foot: Secondary | ICD-10-CM | POA: Diagnosis not present

## 2021-11-13 NOTE — ED Triage Notes (Signed)
Pt states that about a month ago she started having some pain in right ankle going up and down stairs  Pt states she has no pain as long as she is not standing on right ankle  Denies Injury  Denies Meds

## 2021-11-13 NOTE — Discharge Instructions (Addendum)
-   You can use Tylenol or ibuprofen for pain - Please start the exercises - If the pain is not getting better over the next 1-2 weeks, please follow up with a Foot Doctor; contact information to call and make an appointment is below

## 2021-11-13 NOTE — ED Provider Notes (Signed)
[facility]    CSN: [id number] Arrival date & time: 11/13/21  1006      History   Chief Complaint Chief Complaint  Patient presents with   Ankle Pain    Entered by patient    HPI Shannon Gould is a 46 y.o. female.   Patient presents with right posterior ankle/heel pain that has been present for the past month.  Denies any known injury, no fall, accident, or trauma.  No recent sudden increase in physical activity.  Denies swelling, redness, or bruising.  No numbness or tingling in her toes.  Reports the pain is worse after she stands for a long period of time or when she is going up or down stairs.  She has not taken anything for the pain.  A warm compress helps with the pain.      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      Home Medications    Prior to Admission medications   Medication Sig Start Date End Date Taking? Authorizing Provider  albuterol (VENTOLIN HFA) 108 (90 Base) MCG/ACT inhaler Inhale 2 puffs into the lungs every 6 (six) hours as needed. For shortness of breath    [provider]  ARIPiprazole (ABILIFY) 20 MG tablet TAKE 1 TABLET BY MOUTH ONCE DAILY 06/16/21   Melony Overly T, PA-C  b complex vitamins tablet Take 1 tablet by mouth daily. 02/01/20    Melony Overly T, PA-C  busPIRone (BUSPAR) 15 MG tablet Take 1 tablet (15 mg total) by mouth 2 (two) times daily. 08/04/21   Melony Overly T, PA-C  Dihydroergotamine Mesylate HFA (TRUDHESA) 0.725 MG/ACT AERS Place 1 spray into the nose as directed. Place 1 spray into each nostril.  May repeat once after 1 hour.  Maximum 2 doses in 24 hours. 09/25/21   Everlena Cooper, Adam R, DO  Erenumab-aooe (AIMOVIG) 140 MG/ML SOAJ Inject 140 mg (1 pen) as directed every 28 days. 09/25/21   Drema Dallas, DO  furosemide (LASIX) 40 MG tablet Take 40 mg by mouth daily as needed.     [provider]  levonorgestrel (MIRENA, 52 MG,) 20 MCG/24HR IUD 1 each by Intrauterine route once.     [provider]  LORazepam (ATIVAN) 1 MG tablet Take 1-2 tablets (1-2 mg total) by mouth every 8 (eight) hours as needed for anxiety. 06/16/21   Melony Overly T, PA-C  metroNIDAZOLE (FLAGYL) 500 MG tablet Take 1 tablet by mouth every 12 hours for 7 days. 10/26/21     Omega-3 Fatty Acids (FISH OIL PO) Take by mouth.    [provider]  polyethylene glycol (MIRALAX) 17 g packet Take 17 g by mouth daily. 07/13/21   Doree Albee,  PA-C  Semaglutide-Weight Management (WEGOVY) 0.5 MG/0.5ML SOAJ Inject 0.5 mg into the skin once a week. 09/06/21   Jodelle Redhristopher, Bridgette, MD  Semaglutide-Weight Management [facility](WEGOVY) 1 MG/0.5ML SOAJ Inject 1 pen (1 mg)  into the skin once a week. Take after you have done 4 weekly injections of the 0.5 mg dose (this is the third step) 09/06/21   Jodelle Redhristopher, Bridgette, MD  venlafaxine XR (EFFEXOR-XR) 150 MG 24 hr capsule TAKE 1 CAPSULE BY MOUTH DAILY WITH BREAKFAST 06/16/21   Hurst, Rosey Batheresa T, PA-C  venlafaxine XR (EFFEXOR-XR) 75 MG 24 hr capsule TAKE 1 CAPSULE BY MOUTH DAILY WITH BREAKFAST. TAKE WITH 150 MG TO EQUAL A DOSE OF  225 MG. 06/16/21   Hurst, [place] T, PA-C  Wheat Dextrin [facility](BENEFIBER) POWD Use daily as directed 07/13/21   Doree Albeeollier, Amanda R, PA-C  zolpidem (AMBIEN) 10 MG tablet Take 1 tablet (10 mg  total) by mouth at bedtime as needed for sleep. 06/16/21   Cherie OuchHurst, Teresa T, PA-C    Family History Family History  Problem Relation Age of Onset   Hypertension Mother    Diabetes Mother    Hypertension Father    Heart disease Father    Sleep apnea Father    Diabetes Father    Heart attack Father        x2   CAD Father    Hyperlipidemia Father    Heart disease Sister    Anemia Sister    Hypertension Sister    Heart disease Brother    Hypertension Brother    Asthma Maternal Grandmother    Hypertension Paternal Grandmother    Diabetes Paternal Grandmother    Seizures Paternal Grandmother    Diabetes Paternal Grandfather    Anxiety disorder Daughter    Depression Daughter    Asthma Daughter    Asthma Son    Asthma Son     Social History Social History   Tobacco Use   Smoking status: Never   Smokeless tobacco: Never  Vaping Use   Vaping Use: Never used  Substance Use Topics   Alcohol use: Yes    Alcohol/week: 0.0 - 1.0 standard drinks of alcohol    Comment: rare   Drug use: No     Allergies   Hydrocodone, Lexapro [escitalopram], Wellbutrin [bupropion], Caffeine, and Lactose intolerance (gi)   Review of Systems Review of Systems Per HPI  Physical Exam Triage Vital Signs ED Triage Vitals  Enc Vitals Group     BP 11/13/21 1042 133/82     Pulse Rate 11/13/21 1042 73     Resp 11/13/21 1042 18     Temp 11/13/21 1042 98.2 F (36.8 C)     Temp Source 11/13/21 1042 Oral     SpO2 11/13/21 1042 98 %     Weight --      Height --      Head Circumference --      Peak Flow --      Pain Score 11/13/21 1040 5     Pain Loc --      Pain Edu? --      Excl. in GC? --    No data found.  Updated Vital Signs BP 133/82 (BP Location: Right Arm)   Pulse 73   Temp 98.2 F (36.8 C) (Oral)   Resp 18   LMP 10/16/2021 (Approximate)   SpO2 98%   Visual Acuity Right Eye Distance:   Left Eye Distance:   Bilateral Distance:    Right Eye  Near:   Left Eye Near:     Bilateral Near:     Physical Exam Vitals and nursing note reviewed.  Constitutional:      General: She is not in acute distress.    Appearance: Normal appearance. She is not toxic-appearing.  HENT:     Head: Normocephalic and atraumatic.     Mouth/Throat:     Mouth: Mucous membranes are moist.     Pharynx: Oropharynx is clear.  Pulmonary:     Effort: Pulmonary effort is normal. No respiratory distress.  Musculoskeletal:     Right lower leg: No edema.     Left lower leg: No edema.     Right ankle: Normal. No swelling or ecchymosis. No tenderness. Normal range of motion. Normal pulse.     Right Achilles Tendon: Normal. No tenderness or defects.     Left ankle: Normal.     Left Achilles Tendon: Normal.     Right foot: Normal. Normal range of motion and normal capillary refill. Normal pulse.       Feet:  Feet:     Comments: TTP in the area marked; no obvious deformity, swelling, redness.  Full ROM.  Pain with dorsiflexion of right foot.  Skin:    General: Skin is warm and dry.     Capillary Refill: Capillary refill takes less than 2 seconds.     Coloration: Skin is not jaundiced or pale.     Findings: No erythema.  Neurological:     Mental Status: She is alert and oriented to person, place, and time.  Psychiatric:        Behavior: Behavior is cooperative.      UC Treatments / Results  Labs (all labs ordered are listed, but only abnormal results are displayed) Labs Reviewed - No data to display  EKG   Radiology No results found.  Procedures Procedures (including critical care time)  Medications Ordered in UC Medications - No data to display  Initial Impression / Assessment and Plan / UC Course  I have reviewed the triage vital signs and the nursing notes.  Pertinent labs & imaging results that were available during my care of the patient were reviewed by me and considered in my medical decision making (see chart for details).    Suspect plantar fascitis.   Treat with exercises, NSAIDs as needed for pain.  Encouraged close follow up with Podiatry if symptoms persist or worsen despite treatment.   Final Clinical Impressions(s) / UC Diagnoses   Final diagnoses:  Pain of right heel     Discharge Instructions      - You can use Tylenol or ibuprofen for pain - Please start the exercises - If the pain is not getting better over the next 1-2 weeks, please follow up with a Foot Doctor; contact information to call and make an appointment is below     ED Prescriptions   None    PDMP not reviewed this encounter.   Valentino Nose, NP 11/13/21 1157

## 2021-11-17 ENCOUNTER — Other Ambulatory Visit (HOSPITAL_COMMUNITY): Payer: Self-pay

## 2021-11-20 ENCOUNTER — Other Ambulatory Visit (HOSPITAL_COMMUNITY): Payer: Self-pay

## 2021-12-06 ENCOUNTER — Other Ambulatory Visit (HOSPITAL_COMMUNITY): Payer: Self-pay

## 2021-12-08 ENCOUNTER — Encounter: Payer: Self-pay | Admitting: Physician Assistant

## 2021-12-08 ENCOUNTER — Other Ambulatory Visit (HOSPITAL_COMMUNITY): Payer: Self-pay

## 2021-12-08 ENCOUNTER — Ambulatory Visit (HOSPITAL_BASED_OUTPATIENT_CLINIC_OR_DEPARTMENT_OTHER): Payer: 59 | Admitting: Cardiology

## 2021-12-08 ENCOUNTER — Ambulatory Visit (INDEPENDENT_AMBULATORY_CARE_PROVIDER_SITE_OTHER): Payer: 59 | Admitting: Physician Assistant

## 2021-12-08 VITALS — BP 122/86 | HR 91 | Ht 63.0 in | Wt 238.1 lb

## 2021-12-08 DIAGNOSIS — Z6841 Body Mass Index (BMI) 40.0 and over, adult: Secondary | ICD-10-CM

## 2021-12-08 DIAGNOSIS — R5383 Other fatigue: Secondary | ICD-10-CM

## 2021-12-08 DIAGNOSIS — Z7182 Exercise counseling: Secondary | ICD-10-CM | POA: Diagnosis not present

## 2021-12-08 DIAGNOSIS — Z79899 Other long term (current) drug therapy: Secondary | ICD-10-CM | POA: Diagnosis not present

## 2021-12-08 DIAGNOSIS — Z7189 Other specified counseling: Secondary | ICD-10-CM

## 2021-12-08 DIAGNOSIS — F319 Bipolar disorder, unspecified: Secondary | ICD-10-CM | POA: Diagnosis not present

## 2021-12-08 DIAGNOSIS — R7989 Other specified abnormal findings of blood chemistry: Secondary | ICD-10-CM | POA: Diagnosis not present

## 2021-12-08 DIAGNOSIS — Z713 Dietary counseling and surveillance: Secondary | ICD-10-CM | POA: Diagnosis not present

## 2021-12-08 MED ORDER — ZOLPIDEM TARTRATE ER 12.5 MG PO TBCR
12.5000 mg | EXTENDED_RELEASE_TABLET | Freq: Every evening | ORAL | 0 refills | Status: DC | PRN
  Filled 2021-12-08: qty 30, 30d supply, fill #0

## 2021-12-08 MED ORDER — LORAZEPAM 1 MG PO TABS
1.0000 mg | ORAL_TABLET | Freq: Three times a day (TID) | ORAL | 5 refills | Status: DC | PRN
  Filled 2021-12-08: qty 60, 10d supply, fill #0
  Filled 2022-05-26: qty 60, 10d supply, fill #1

## 2021-12-08 MED ORDER — VENLAFAXINE HCL ER 150 MG PO CP24
ORAL_CAPSULE | Freq: Every day | ORAL | 1 refills | Status: DC
  Filled 2021-12-08: qty 90, 90d supply, fill #0
  Filled 2022-04-04: qty 90, 90d supply, fill #1

## 2021-12-08 MED ORDER — VENLAFAXINE HCL ER 75 MG PO CP24
ORAL_CAPSULE | ORAL | 1 refills | Status: DC
  Filled 2021-12-08: qty 90, 90d supply, fill #0
  Filled 2022-04-04: qty 90, 90d supply, fill #1

## 2021-12-08 MED ORDER — ARIPIPRAZOLE 20 MG PO TABS
ORAL_TABLET | Freq: Every day | ORAL | 1 refills | Status: DC
  Filled 2021-12-08: qty 60, 60d supply, fill #0
  Filled 2021-12-08: qty 30, 30d supply, fill #0
  Filled 2022-04-04: qty 30, 30d supply, fill #1
  Filled 2022-04-04: qty 60, 60d supply, fill #1

## 2021-12-08 NOTE — Progress Notes (Signed)
[facility]  Patient ID: DANNIEL GRENZ,  MRN: [id number]  PCP: Assunta Found, MD  Date of Evaluation: 12/08/2021 Time spent:30 minutes  Chief Complaint:  Chief Complaint   Anxiety; Depression; Insomnia; Follow-up    HISTORY/CURRENT STATUS: HPI for routine med check.  States she is doing well with her mood.  Is able to enjoy things, but does get tired easily.  States she wakes up every morning between 2:45 AM and 3 AM.  She takes the Ambien every night and it helps put her to sleep but she still wakes up.  Most of the time she is able to go back to sleep within 30 minutes or so but sometimes she lays awake until an hour or so before she has to get up, then she is even more tired the next day.  Appetite has not changed.  No extreme sadness, tearfulness, or feelings of hopelessness.  Denies any changes in concentration, making decisions or remembering things.  Work is going well.  Denies suicidal or homicidal thoughts.  Does take the Ativan as needed, not very often.  Needs it may be a couple of times a week if that much.  Not having panic attacks but when she does take it it is because she is overwhelmed and has a generalized sense of unease.  It is still effective.  Patient denies increased energy with decreased need for sleep, no increased talkativeness, no racing thoughts, no impulsivity or risky behaviors, no increased spending, no increased libido, no grandiosity, no increased irritability or anger, and no hallucinations.  Review of Systems  Constitutional:  Positive for malaise/fatigue and weight loss.       Recently started [facility] for weight loss.  HENT: Negative.    Eyes: Negative.   Respiratory: Negative.    Cardiovascular: Negative.   Gastrointestinal: Negative.   Genitourinary: Negative.   Musculoskeletal: Negative.   Skin: Negative.   Neurological: Negative.   Endo/Heme/Allergies: Negative.   Psychiatric/Behavioral:         See HPI    Individual  Medical History/ Review of Systems: Changes? :No    Past medications for mental health diagnoses include: Effexor, Vistaril, Xanax, Ambien, Zoloft, Wellbutrin which caused increased agitation and suicidal ideations, trazodone, Abilify, Sonata, Valium ( not sure why it was stopped) mirtazapine was not very effective  Allergies: Hydrocodone, Lexapro [escitalopram], Wellbutrin [bupropion], Caffeine, and Lactose intolerance (gi)  Current Medications:  Current Outpatient Medications:    b complex vitamins tablet, Take 1 tablet by mouth daily., Disp: 30 tablet, Rfl: 11   busPIRone (BUSPAR) 15 MG tablet, Take 1 tablet (15 mg total) by mouth 2 (two) times daily., Disp: 180 tablet, Rfl: 1   Cholecalciferol (VITAMIN D) 125 MCG (5000 UT) CAPS, Take by mouth., Disp: , Rfl:    Dihydroergotamine Mesylate HFA (TRUDHESA) 0.725 MG/ACT AERS, Place 1 spray into the nose as directed. Place 1 spray into each nostril.  May repeat once after 1 hour.  Maximum 2 doses in 24 hours., Disp: 8 mL, Rfl: 5   Erenumab-aooe (AIMOVIG) 140 MG/ML SOAJ, Inject 140 mg (1 pen) as directed every 28 days., Disp: 1 mL, Rfl: 5   furosemide (LASIX) 40 MG tablet, Take 40 mg by mouth daily as needed. , Disp: , Rfl:    levonorgestrel (MIRENA, 52 MG,) 20 MCG/24HR IUD, 1 each by Intrauterine route once. , Disp: , Rfl:    Omega-3 Fatty Acids (FISH OIL PO), Take by mouth., Disp: , Rfl:    polyethylene glycol (MIRALAX)  17 g packet, Take 17 g by mouth daily., Disp: 30 each, Rfl: 3   Semaglutide-Weight Management (WEGOVY) 0.5 MG/0.5ML SOAJ, Inject 0.5 mg into the skin once a week., Disp: 2 mL, Rfl: 1   Wheat Dextrin (BENEFIBER) POWD, Use daily as directed, Disp: 529 g, Rfl: 0   zolpidem (AMBIEN CR) 12.5 MG CR tablet, Take 1 tablet (12.5 mg total) by mouth at bedtime as needed for sleep., Disp: 30 tablet, Rfl: 0   ARIPiprazole (ABILIFY) 20 MG tablet, TAKE 1 TABLET BY MOUTH ONCE DAILY, Disp: 90 tablet, Rfl: 1   LORazepam (ATIVAN) 1 MG tablet, Take  1-2 tablets by mouth every 8 (eight) hours as needed for anxiety., Disp: 60 tablet, Rfl: 5   Semaglutide-Weight Management (WEGOVY) 1 MG/0.5ML SOAJ, Inject 1 pen (1 mg)  into the skin once a week. Take after you have done 4 weekly injections of the 0.5 mg dose (this is the third step) (Patient not taking: Reported on 12/08/2021), Disp: 2 mL, Rfl: 1   venlafaxine XR (EFFEXOR-XR) 150 MG 24 hr capsule, TAKE 1 CAPSULE BY MOUTH DAILY WITH BREAKFAST, Disp: 90 capsule, Rfl: 1   venlafaxine XR (EFFEXOR-XR) 75 MG 24 hr capsule, TAKE 1 CAPSULE BY MOUTH DAILY WITH BREAKFAST. TAKE WITH 150 MG TO EQUAL A DOSE OF  225 MG., Disp: 90 capsule, Rfl: 1 Medication Side Effects: none  Family Medical/ Social History: Changes?  No  MENTAL HEALTH EXAM:  Last menstrual period 10/16/2021.There is no height or weight on file to calculate BMI.  General Appearance: Casual and Well Groomed  Eye Contact:  Good  Speech:  Clear and Coherent and Normal Rate  Volume:  Normal  Mood:  Euthymic  Affect:  Congruent  Thought Process:  Goal Directed and Descriptions of Associations: Circumstantial  Orientation:  Full (Time, Place, and Person)  Thought Content: Logical   Suicidal Thoughts:  No  Homicidal Thoughts:  No  Memory:  WNL  Judgement:  Good  Insight:  Good  Psychomotor Activity:  Normal  Concentration:  Concentration: Good and Attention Span: Good  Recall:  Good  Fund of Knowledge: Good  Language: Good  Assets:  Desire for Improvement Financial Resources/Insurance Housing Transportation Vocational/Educational  ADL's:  Intact  Cognition: WNL  Prognosis:  Good   09/19/2021 Vitamin D level 72  DIAGNOSES:    ICD-10-CM   1. Bipolar I disorder (HCC)  F31.9 Comprehensive metabolic panel    Lipid panel    Hemoglobin A1c    2. Fatigue, unspecified type  R53.83 Comprehensive metabolic panel    CBC with Differential/Platelet    Hemoglobin A1c    TSH    3. Low vitamin D level  R79.89 VITAMIN D 25 Hydroxy (Vit-D  Deficiency, Fractures)    4. Encounter for long-term (current) use of medications  Z79.899 VITAMIN D 25 Hydroxy (Vit-D Deficiency, Fractures)    Comprehensive metabolic panel    CBC with Differential/Platelet    Lipid panel    Hemoglobin A1c    TSH      Receiving Psychotherapy: No    RECOMMENDATIONS:  PDMP was reviewed. Last Ambien filled 11/08/2021. Last Ativan filled 09/13/2021. I provided 30 minutes of face to face time during this encounter, including time spent before and after the visit in records review, medical decision making, counseling pertinent to today's visit, and charting.   Discussed sleep hygiene.  She is already practicing good methods.  The Ambien may have pooped out but she has responded to it for quite a  while.  Recommend changing to CR in hopes that she will stay asleep the entire night. Due to the fatigue and known low vitamin D, labs need to be drawn looking for vitamin D level, metabolic syndrome, anemia or hypothyroidism.  She will have those drawn fasting.  Continue vitamin D 5000 IUs OTC daily Continue Abilify 20 mg, 1 p.o. every morning. Continue BuSpar 15 mg, 1 tablet twice daily for anxiety. Continue Ativan 1 mg, 1-2 p.o. 3 times daily as needed anxiety. Continue Effexor XR 150 mg +75 mg= 225 mg p.o. daily. Change Ambien to CR 12.5 mg, 1 p.o. nightly as needed. Lab ordered as above. Return in 6 months.  Donnal Moat, PA-C

## 2021-12-08 NOTE — Progress Notes (Signed)
Cardiology Office Note:    Date:  12/08/2021   ID:  Shannon Gould, DOB June 22, 1975, MRN [id number]  PCP:  Sharilyn Sites, MD  Cardiologist:  Buford Dresser, MD PhD  Referring MD: Sharilyn Sites, MD   CC: Follow-up  History of Present Illness:    Shannon Gould is a 46 y.o. female with a hx of depression, anxiety who is being seen for follow up. I initially met her 07/29/2018 as a new consult at the request of Sharilyn Sites, MD for the evaluation and management of chest pain/palpitations.  Chest pain/palpitations: -Initial onset: early February -Quality: pressure, like a cramp -Frequency: happening several times/day; had one day when it was very painful overnight -Duration: severe episode lasted about 2 hours, usually last a few minutes -Associated symptoms: no nausea, SOB, diaphoresis, lightheadedness. Does move down left arm on occasion -Aggravating/alleviating factors: no clear. Not better with tums or Xanax -Prior cardiac history: none -Prior ECG/workup: stress echo >10 years ago at [facility], no further testing -Prior treatment: none -Alcohol: drinks about 1x/month -Tobacco: never -Comorbidities: no history of diabetes, high blood pressure, no history of high cholesterol -Exercise level: no intentional exercise, but can climb stairs, etc -Cardiac ROS: no shortness of breath, no PND, no orthopnea, no LE edema, no syncope -Family history: father has had 2 Mis, has three stents. First was in his late 40s/early 40s. Mother and father, as well as sister and brother, have high blood pressure. [name] had both MI and stroke.   At her last appointment, she was suffering from chest tightness that occurred 1-2 times a month. Her most recent episode had lasted around 15 minutes. She was trying the Keto diet and intermittent fasting including 45 minutes of total exercise on the treadmill and stationary bike. She had also taken Topiramate which worked for a short period of time. Her  most recent lab work was done in 1/23. She was started on Wegovy.  Today, she is doing well. She has noted improvement in her appetite. Occasionally she will have constipation, but this is controlled with Miralax. Denies nausea or blood in the stool.    She has some ankle pain in her RLE and does have some swelling. She has to have a cone procedure done.   She remains compliant with Wegovy. She is on her last 0.5 mg dose. So far with [facility] she has lost about 7 lbs.  She states that she was 236 lbs at home this morning.  She denies any palpitations, chest pain, shortness of breath. No lightheadedness, headaches, syncope, orthopnea, or PND.   Past Medical History:  Diagnosis Date   Anemia    hx of patient reports    Anxiety    Asthma    Depression    GERD (gastroesophageal reflux disease)    Head ache     Past Surgical History:  Procedure Laterality Date   CHOLECYSTECTOMY     transvaginal mesh      Current Medications: Current Outpatient Medications on File Prior to Visit  Medication Sig   albuterol (VENTOLIN HFA) 108 (90 Base) MCG/ACT inhaler Inhale 2 puffs into the lungs every 6 (six) hours as needed. For shortness of breath   ARIPiprazole (ABILIFY) 20 MG tablet TAKE 1 TABLET BY MOUTH ONCE DAILY   b complex vitamins tablet Take 1 tablet by mouth daily.   busPIRone (BUSPAR) 15 MG tablet Take 1 tablet (15 mg total) by mouth 2 (two) times daily.   Dihydroergotamine Mesylate HFA ([place])  0.725 MG/ACT AERS Place 1 spray into the nose as directed. Place 1 spray into each nostril.  May repeat once after 1 hour.  Maximum 2 doses in 24 hours.   Erenumab-aooe (AIMOVIG) 140 MG/ML SOAJ Inject 140 mg (1 pen) as directed every 28 days.   furosemide (LASIX) 40 MG tablet Take 40 mg by mouth daily as needed.    levonorgestrel (MIRENA, 52 MG,) 20 MCG/24HR IUD 1 each by Intrauterine route once.    LORazepam (ATIVAN) 1 MG tablet Take 1-2 tablets (1-2 mg total) by mouth every 8 (eight) hours as  needed for anxiety.   metroNIDAZOLE (FLAGYL) 500 MG tablet Take 1 tablet by mouth every 12 hours for 7 days.   Omega-3 Fatty Acids (FISH OIL PO) Take by mouth.   polyethylene glycol (MIRALAX) 17 g packet Take 17 g by mouth daily.   Semaglutide-Weight Management (WEGOVY) 0.5 MG/0.5ML SOAJ Inject 0.5 mg into the skin once a week.   Semaglutide-Weight Management (WEGOVY) 1 MG/0.5ML SOAJ Inject 1 pen (1 mg)  into the skin once a week. Take after you have done 4 weekly injections of the 0.5 mg dose (this is the third step)   venlafaxine XR (EFFEXOR-XR) 150 MG 24 hr capsule TAKE 1 CAPSULE BY MOUTH DAILY WITH BREAKFAST   venlafaxine XR (EFFEXOR-XR) 75 MG 24 hr capsule TAKE 1 CAPSULE BY MOUTH DAILY WITH BREAKFAST. TAKE WITH 150 MG TO EQUAL A DOSE OF  225 MG.   Wheat Dextrin (BENEFIBER) POWD Use daily as directed   zolpidem (AMBIEN) 10 MG tablet Take 1 tablet (10 mg total) by mouth at bedtime as needed for sleep.   No current facility-administered medications on file prior to visit.     Allergies:   Hydrocodone, Lexapro [escitalopram], Wellbutrin [bupropion], Caffeine, and Lactose intolerance (gi)   Social History   Socioeconomic History   Marital status: Single    Spouse name: Not on file   Number of children: 3   Years of education: Not on file   Highest education level: Associate degree: occupational, Hotel manager, or vocational program  Occupational History   Occupation: LPN    Employer: [employer]  Tobacco Use   Smoking status: Never   Smokeless tobacco: Never  Vaping Use   Vaping Use: Never used  Substance and Sexual Activity   Alcohol use: Yes    Alcohol/week: 0.0 - 1.0 standard drinks of alcohol    Comment: rare   Drug use: No   Sexual activity: Yes    Birth control/protection: I.U.D.  Other Topics Concern   Not on file  Social History Narrative   Patient is left-handed. She lives in a one level home with 2 of her children, one is at college. She avoids caffeine. She  does not exercise.   Social Determinants of Health   Financial Resource Strain: Not on file  Food Insecurity: Not on file  Transportation Needs: Not on file  Physical Activity: Not on file  Stress: Not on file  Social Connections: Not on file     Family History: The patient's family history includes Anemia in her sister; Anxiety disorder in her daughter; Asthma in her daughter, maternal grandmother, son, and son; CAD in her father; Depression in her daughter; Diabetes in her father, mother, paternal grandfather, and paternal grandmother; Heart attack in her father; Heart disease in her brother, father, and sister; Hyperlipidemia in her father; Hypertension in her brother, father, mother, paternal grandmother, and sister; Seizures in her paternal grandmother; Sleep apnea  in her father.  ROS:   Please see the history of present illness. (+) RLE ankle pain (+) Bilateral ankle edema All other systems are reviewed and negative.    EKGs/Labs/Other Studies Reviewed:    The following studies were reviewed today: Notes from PCP  ETT 08/07/2018: Blood pressure demonstrated a normal response to exercise. There was no ST segment deviation noted during stress.   ETT with normal exercise tolerance (6:29); No CP; normal BP response; no ST changes; negative adequate ETT; Duke treadmill score 6.  EKG:  EKG is personally reviewed.   12/08/2021: EKG was not ordered. 09/06/2021: NSR at 82 bpm 07/29/2018: normal sinus rhythm  Recent Labs: 06/16/2021: Hemoglobin 14.1; Platelets 275  Recent Lipid Panel    Component Value Date/Time   CHOL 224 (H) 06/16/2021 1141   TRIG 77 06/16/2021 1141   HDL 67 06/16/2021 1141   CHOLHDL 3.3 06/16/2021 1141   LDLCALC 144 (H) 06/16/2021 1141     Physical Exam:    VS:  BP 122/86 (BP Location: Right Arm, Patient Position: Sitting, Cuff Size: Large)   Pulse 91   Ht '5\' 3"'  (1.6 m)   Wt 238 lb 1.6 oz (108 kg)   LMP 10/16/2021 (Approximate)   SpO2 96%   BMI 42.18  kg/m     Wt Readings from Last 3 Encounters:  09/25/21 241 lb 9.6 oz (109.6 kg)  09/06/21 245 lb 6.4 oz (111.3 kg)  07/13/21 248 lb 3.2 oz (112.6 kg)     GEN: Well nourished, well developed in no acute distress HEENT: Normal NECK: No JVD; No carotid bruits LYMPHATICS: No lymphadenopathy CARDIAC: regular rhythm, normal S1 and S2, no murmurs, rubs, gallops. Radial and DP pulses 2+ bilaterally. RESPIRATORY:  Clear to auscultation without rales, wheezing or rhonchi  ABDOMEN: Soft, non-tender, non-distended MUSCULOSKELETAL:  No edema; No deformity  SKIN: Warm and dry NEUROLOGIC:  Alert and oriented x 3 PSYCHIATRIC:  Normal affect   ASSESSMENT:    1. Class 3 severe obesity due to excess calories without serious comorbidity with body mass index (BMI) of 40.0 to 44.9 in adult [facility])   2. Cardiac risk counseling   3. Exercise counseling   4. Counseling on health promotion and disease prevention   5. Nutritional counseling      PLAN:    Chest discomfort:  -improved -prior ETT reassuring -reviewed red flag warning signs that need immediate medical attention  Obesity, class 3, BMI 43 At risk for metabolic syndrome: aripiprazole increases her risk for metabolic syndrome. -doing well on semaglutide, has already lost weight on lower doses -continue uptitration -reviewed diet, exercise recommendations at length  -recommend heart healthy/Mediterranean diet, with whole grains, fruits, vegetable, fish, lean meats, nuts, and olive oil. Limit salt. -recommend moderate walking, 3-5 times/week for 30-50 minutes each session. Aim for at least 150 minutes.week. Goal should be pace of 3 miles/hours, or walking 1.5 miles in 30 minutes -recommend avoidance of tobacco products. Avoid excess alcohol. -ASCVD risk score: The 10-year ASCVD risk score (Arnett DK, et al., 2019) is: 0.8%   Values used to calculate the score:     Age: 31 years     Sex: Female     Is Non-Hispanic African American: Yes      Diabetic: No     Tobacco smoker: No     Systolic Blood Pressure: 448 mmHg     Is BP treated: No     HDL Cholesterol: 67 mg/dL     Total Cholesterol: 224  mg/dL    Plan for follow up: 3 months  Medication Adjustments/Labs and Tests Ordered: Current medicines are reviewed at length with the patient today.  Concerns regarding medicines are outlined above.   No orders of the defined types were placed in this encounter.  No orders of the defined types were placed in this encounter.   Patient Instructions  Medication Instructions:  Let us know how fast you want to go up on the Wegovy--we can increase the dose monthly or stay on a dose for extra time depending on how you are feeling on that dose.  *If you need a refill on your cardiac medications before your next appointment, please call your pharmacy*   Lab Work: None ordered today   Testing/Procedures: None ordered today   Follow-Up: At [facility] ([place]), you and your health needs are our priority.  As part of our continuing mission to provide you with exceptional heart care, we have created designated Provider Care Teams.  These Care Teams include your primary Cardiologist (physician) and Advanced Practice Providers (APPs -  Physician Assistants and Nurse Practitioners) who all work together to provide you with the care you need, when you need it.  We recommend signing up for the patient portal called "MyChart".  Sign up information is provided on this After Visit Summary.  MyChart is used to connect with patients for Virtual Visits (Telemedicine).  Patients are able to view lab/test results, encounter notes, upcoming appointments, etc.  Non-urgent messages can be sent to your provider as well.   To learn more about what you can do with MyChart, go to [email or web address].    Your next appointment:   3 month(s)  The format for your next appointment:   In Person  Provider:   Buford Dresser, MD{      I,Jenifer  Velasquez,acting as a scribe for Buford Dresser, MD.,have documented all relevant documentation on the behalf of Buford Dresser, MD,as directed by  Buford Dresser, MD while in the presence of Buford Dresser, MD.  I, Buford Dresser, MD, have reviewed all documentation for this visit. The documentation on 12/08/21 for the exam, diagnosis, procedures, and orders are all accurate and complete.   Signed, Buford Dresser, MD PhD 12/08/2021 7:20 PM    [place]

## 2021-12-08 NOTE — Patient Instructions (Addendum)
Medication Instructions:  Let us know how fast you want to go up on the Wegovy--we can increase the dose monthly or stay on a dose for extra time depending on how you are feeling on that dose.  *If you need a refill on your cardiac medications before your next appointment, please call your pharmacy*   Lab Work: None ordered today   Testing/Procedures: None ordered today   Follow-Up: At [facility], you and your health needs are our priority.  As part of our continuing mission to provide you with exceptional heart care, we have created designated Provider Care Teams.  These Care Teams include your primary Cardiologist (physician) and Advanced Practice Providers (APPs -  Physician Assistants and Nurse Practitioners) who all work together to provide you with the care you need, when you need it.  We recommend signing up for the patient portal called "MyChart".  Sign up information is provided on this After Visit Summary.  MyChart is used to connect with patients for Virtual Visits (Telemedicine).  Patients are able to view lab/test results, encounter notes, upcoming appointments, etc.  Non-urgent messages can be sent to your provider as well.   To learn more about what you can do with MyChart, go to [email or web address].    Your next appointment:   3 month(s)  The format for your next appointment:   In Person  Provider:   Jodelle Red, MD{

## 2021-12-09 LAB — CBC WITH DIFFERENTIAL/PLATELET
Basophils Absolute: 0 10*3/uL (ref 0.0–0.2)
Basos: 1 %
EOS (ABSOLUTE): 0.2 10*3/uL (ref 0.0–0.4)
Eos: 3 %
Hematocrit: 41.5 % (ref 34.0–46.6)
Hemoglobin: 13.5 g/dL (ref 11.1–15.9)
Immature Grans (Abs): 0 10*3/uL (ref 0.0–0.1)
Immature Granulocytes: 0 %
Lymphocytes Absolute: 2.5 10*3/uL (ref 0.7–3.1)
Lymphs: 48 %
MCH: 26.8 pg (ref 26.6–33.0)
MCHC: 32.5 g/dL (ref 31.5–35.7)
MCV: 82 fL (ref 79–97)
Monocytes Absolute: 0.3 10*3/uL (ref 0.1–0.9)
Monocytes: 6 %
Neutrophils Absolute: 2.3 10*3/uL (ref 1.4–7.0)
Neutrophils: 42 %
Platelets: 269 10*3/uL (ref 150–450)
RBC: 5.04 x10E6/uL (ref 3.77–5.28)
RDW: 12.5 % (ref 11.7–15.4)
WBC: 5.4 10*3/uL (ref 3.4–10.8)

## 2021-12-09 LAB — COMPREHENSIVE METABOLIC PANEL
ALT: 10 IU/L (ref 0–32)
AST: 14 IU/L (ref 0–40)
Albumin/Globulin Ratio: 1.6 (ref 1.2–2.2)
Albumin: 4.4 g/dL (ref 3.8–4.8)
Alkaline Phosphatase: 77 IU/L (ref 44–121)
BUN/Creatinine Ratio: 8 — ABNORMAL LOW (ref 9–23)
BUN: 6 mg/dL (ref 6–24)
Bilirubin Total: 0.3 mg/dL (ref 0.0–1.2)
CO2: 23 mmol/L (ref 20–29)
Calcium: 9.1 mg/dL (ref 8.7–10.2)
Chloride: 102 mmol/L (ref 96–106)
Creatinine, Ser: 0.74 mg/dL (ref 0.57–1.00)
Globulin, Total: 2.8 g/dL (ref 1.5–4.5)
Glucose: 76 mg/dL (ref 70–99)
Potassium: 4.1 mmol/L (ref 3.5–5.2)
Sodium: 139 mmol/L (ref 134–144)
Total Protein: 7.2 g/dL (ref 6.0–8.5)
eGFR: 103 mL/min/{1.73_m2} (ref >59)

## 2021-12-09 LAB — LIPID PANEL
Chol/HDL Ratio: 3.5 ratio (ref 0.0–4.4)
Cholesterol, Total: 207 mg/dL — ABNORMAL HIGH (ref 100–199)
HDL: 60 mg/dL (ref >39)
LDL Chol Calc (NIH): 136 mg/dL — ABNORMAL HIGH (ref 0–99)
Triglycerides: 64 mg/dL (ref 0–149)
VLDL Cholesterol Cal: 11 mg/dL (ref 5–40)

## 2021-12-09 LAB — VITAMIN D 25 HYDROXY (VIT D DEFICIENCY, FRACTURES): Vit D, 25-Hydroxy: 42.3 ng/mL (ref 30.0–100.0)

## 2021-12-09 LAB — HEMOGLOBIN A1C
Est. average glucose Bld gHb Est-mCnc: 111 mg/dL
Hgb A1c MFr Bld: 5.5 % (ref 4.8–5.6)

## 2021-12-11 ENCOUNTER — Encounter: Payer: Self-pay | Admitting: Physician Assistant

## 2021-12-11 ENCOUNTER — Other Ambulatory Visit (HOSPITAL_COMMUNITY): Payer: Self-pay

## 2021-12-11 NOTE — Progress Notes (Signed)
CBC is within normal limits, CMP normal, hemoglobin A1c normal at 5.5, vitamin D is 42 which is on the lower end of normal range, I recommend we restart the 50,000 IUs once a week.  Total cholesterol was 207 which is barely elevated, triglycerides and HDL are good, LDL is 136 which is a little high.  Recommend low-fat diet and exercise. Let me know which pharmacy to send the vitamin D2.

## 2021-12-12 NOTE — Progress Notes (Signed)
[facility]

## 2021-12-13 ENCOUNTER — Other Ambulatory Visit (HOSPITAL_COMMUNITY): Payer: Self-pay

## 2021-12-13 ENCOUNTER — Other Ambulatory Visit: Payer: Self-pay | Admitting: Physician Assistant

## 2021-12-13 DIAGNOSIS — R7989 Other specified abnormal findings of blood chemistry: Secondary | ICD-10-CM

## 2021-12-13 DIAGNOSIS — Z79899 Other long term (current) drug therapy: Secondary | ICD-10-CM

## 2021-12-13 MED ORDER — VITAMIN D (ERGOCALCIFEROL) 1.25 MG (50000 UNIT) PO CAPS
ORAL_CAPSULE | ORAL | 0 refills | Status: DC
  Filled 2021-12-13: qty 12, 84d supply, fill #0

## 2021-12-13 NOTE — Progress Notes (Signed)
I sent the prescription and.  Also ordered a vitamin D level, have her get that drawn in about 6 weeks.  Please mail her the order.  Thank you

## 2021-12-13 NOTE — Progress Notes (Signed)
Sent message via mychart

## 2021-12-20 ENCOUNTER — Other Ambulatory Visit (HOSPITAL_COMMUNITY): Payer: Self-pay

## 2021-12-20 ENCOUNTER — Ambulatory Visit: Payer: 59 | Admitting: Podiatry

## 2021-12-20 ENCOUNTER — Ambulatory Visit (INDEPENDENT_AMBULATORY_CARE_PROVIDER_SITE_OTHER): Payer: 59

## 2021-12-20 DIAGNOSIS — M722 Plantar fascial fibromatosis: Secondary | ICD-10-CM

## 2021-12-20 DIAGNOSIS — M7661 Achilles tendinitis, right leg: Secondary | ICD-10-CM

## 2021-12-20 DIAGNOSIS — M21619 Bunion of unspecified foot: Secondary | ICD-10-CM

## 2021-12-20 MED ORDER — DICLOFENAC SODIUM 75 MG PO TBEC
75.0000 mg | DELAYED_RELEASE_TABLET | Freq: Two times a day (BID) | ORAL | 2 refills | Status: DC
  Filled 2021-12-20: qty 50, 25d supply, fill #0

## 2021-12-20 MED ORDER — TRIAMCINOLONE ACETONIDE 10 MG/ML IJ SUSP
10.0000 mg | Freq: Once | INTRAMUSCULAR | Status: AC
  Administered 2021-12-20: 10 mg

## 2021-12-20 NOTE — Patient Instructions (Signed)

## 2021-12-20 NOTE — Progress Notes (Signed)
Subjective:   Patient ID: Shannon Gould, female   DOB: 46 y.o.   MRN: [id number]   HPI Patient states she is developed a lot of pain in the back of her right Achilles tendon insertion and states its been hurting for several months and she does not remember specific injury but is not able to be active.  Patient is moderately obese which is complicating factor does not smoke likes to be active if possible   Review of Systems  All other systems reviewed and are negative.       Objective:  Physical Exam Vitals and nursing note reviewed.  Constitutional:      Appearance: She is well-developed.  Pulmonary:     Effort: Pulmonary effort is normal.  Musculoskeletal:        General: Normal range of motion.  Skin:    General: Skin is warm.  Neurological:     Mental Status: She is alert.     Neurovascular status intact muscle strength found to be adequate range of motion within normal limits with patient found to have posterior inflammation medial side right Achilles tendon insertion that is painful when pressed and makes walking difficult.  Patient does have moderate equinus condition bilateral and is found to have good digital perfusion well oriented x3     Assessment:  Acute Achilles tendinitis right with inflammation of the medial side insertional point of the Achilles tendon into the calcaneus     Plan:  H&P x-ray reviewed educated patient and discussed injection possibilities for rupture with this she wants to proceed understanding risk and today just on the medial side I did sterile prep and injected 3 mg Dexasone Kenalog 5 mg Xylocaine advised on ice therapy and dispensed air fracture walker to completely immobilize for 4 weeks due to the discomfort level present.  Encouraged her to call with any questions concerns which may arise and she will be seen back to reevaluate in 6 weeks and I did dispense heel lift today along with exercises to stretch  X-rays were negative for signs  of spurring associated with this patient appears to be more soft tissue inflammatory condition

## 2021-12-25 DIAGNOSIS — N898 Other specified noninflammatory disorders of vagina: Secondary | ICD-10-CM | POA: Diagnosis not present

## 2021-12-25 DIAGNOSIS — N760 Acute vaginitis: Secondary | ICD-10-CM | POA: Diagnosis not present

## 2021-12-29 ENCOUNTER — Other Ambulatory Visit (HOSPITAL_COMMUNITY): Payer: Self-pay

## 2022-01-04 ENCOUNTER — Encounter (HOSPITAL_BASED_OUTPATIENT_CLINIC_OR_DEPARTMENT_OTHER): Payer: Self-pay

## 2022-01-04 ENCOUNTER — Other Ambulatory Visit (HOSPITAL_COMMUNITY): Payer: Self-pay

## 2022-01-04 DIAGNOSIS — Z6841 Body Mass Index (BMI) 40.0 and over, adult: Secondary | ICD-10-CM

## 2022-01-04 MED ORDER — WEGOVY 1.7 MG/0.75ML ~~LOC~~ SOAJ
1.7000 mg | SUBCUTANEOUS | 0 refills | Status: DC
  Filled 2022-01-04: qty 3, 28d supply, fill #0

## 2022-01-10 NOTE — Progress Notes (Signed)
01/12/2022 Shannon Gould [id number] 08/11/1975  Referring provider: Assunta Found, MD Primary GI doctor: Dr. Rhea Belton  ASSESSMENT AND PLAN:   Rectal bleeding + FOBT this visit with healed fissure, long discussion with patient and she is willing to proceed with colonoscopy We have discussed the risks of bleeding, infection, perforation, medication reactions, and remote risk of death associated with colonoscopy. All questions were answered and the patient acknowledges these risk and wishes to proceed.  Chronic idiopathic constipation - Increase fiber/ water intake, decrease caffeine, increase activity level. -continue miralax, fiber, consider linzess, patient declined at this time  Gastroesophageal reflux disease without esophagitis Will start the patient on a PPI, worsened with wegovy No alarm symptoms, weight loss, melena, nocturnal symptoms. Lifestyle changes discussed, avoid NSAIDS, ETOH - small frequent meals, if not better can consider EGD -     pantoprazole (PROTONIX) 20 MG tablet; Take 1 tablet (20 mg total) by mouth daily before breakfast.   History of Present Illness:  46 y.o. female  with a past medical history of depression, anxiety, insomnia, migraines, GERD and others listed below, returns to clinic today for evaluation of rectal bleeding.  07/13/2021 seen for rectal bleeding in the office by myself, thought to have possible anterior fissure with tenderness.  Discussed doing colonoscopy however she preferred not to pursue.  Patient given diltiazem, MiraLAX Benefiber referral to physical therapy for pelvic floor. If patient continues to have rectal bleeding, constipation will schedule for colonoscopy  She was doing miralax 1 capful daily with benefiber but she had fecal incontinence so only doing it twice a week.  She states she ha no more rectal pain or rectal bleeding.  She states she is having BM 2 x a week, soft stools.  Has some lower AB pain, last 2 days,  intermittent, bilateral lower extremity.  Rectal pain has resolved.  No weight loss, no fever, chills.   She has been on wegovy for 2 months, 1.7 and has been having worsening GERD. She denies nausea or vomiting.  Has been haivng with reflux into her throat. No melena, no dysphagia, no nocturnal symptoms.  Has been taking tums.  She take voltern, has not taken in 1 week, was on once a day.  Denies ETOH.    Current Medications:   Current Outpatient Medications (Endocrine & Metabolic):    levonorgestrel (MIRENA, 52 MG,) 20 MCG/24HR IUD, 1 each by Intrauterine route once.   Current Outpatient Medications (Cardiovascular):    furosemide (LASIX) 40 MG tablet, Take 40 mg by mouth daily as needed.    Current Outpatient Medications (Analgesics):    diclofenac (VOLTAREN) 75 MG EC tablet, Take 1 tablet (75 mg total) by mouth 2 (two) times daily.   Dihydroergotamine Mesylate HFA ([place]) 0.725 MG/ACT AERS, Place 1 spray into the nose as directed. Place 1 spray into each nostril.  May repeat once after 1 hour.  Maximum 2 doses in 24 hours.   Erenumab-aooe (AIMOVIG) 140 MG/ML SOAJ, Inject 140 mg (1 pen) as directed every 28 days.   Current Outpatient Medications (Other):    ARIPiprazole (ABILIFY) 20 MG tablet, TAKE 1 TABLET BY MOUTH ONCE DAILY   b complex vitamins tablet, Take 1 tablet by mouth daily.   busPIRone (BUSPAR) 15 MG tablet, Take 1 tablet (15 mg total) by mouth 2 (two) times daily.   Cholecalciferol (VITAMIN D) 125 MCG (5000 UT) CAPS, Take by mouth.   LORazepam (ATIVAN) 1 MG tablet, Take 1-2 tablets by mouth every 8 (eight)  hours as needed for anxiety.   Omega-3 Fatty Acids (FISH OIL PO), Take by mouth.   polyethylene glycol (MIRALAX) 17 g packet, Take 17 g by mouth daily.   Semaglutide-Weight Management (WEGOVY) 1.7 MG/0.75ML SOAJ, Inject 1.7 mg into the skin once a week.   venlafaxine XR (EFFEXOR-XR) 150 MG 24 hr capsule, TAKE 1 CAPSULE BY MOUTH DAILY WITH BREAKFAST    venlafaxine XR (EFFEXOR-XR) 75 MG 24 hr capsule, TAKE 1 CAPSULE BY MOUTH DAILY WITH BREAKFAST. TAKE WITH 150 MG TO EQUAL A DOSE OF  225 MG.   Vitamin D, Ergocalciferol, (DRISDOL) 1.25 MG (50000 UNIT) CAPS capsule, TAKE 1 CAPSULE BY MOUTH WEEKLY   Wheat Dextrin (BENEFIBER) POWD, Use daily as directed   zolpidem (AMBIEN CR) 12.5 MG CR tablet, Take 1 tablet (12.5 mg total) by mouth at bedtime as needed for sleep.  Surgical History:  She  has a past surgical history that includes Laparoscopic cholecystectomy (2002); Urethral sling (08/08/2010); Removal of urinary sling (02/17/2018); Cystoscopy with urethral dilatation; and ERCP (2006). Family History:  Her family history includes Anemia in her sister; Anxiety disorder in her daughter; Asthma in her daughter, maternal grandmother, son, and son; CAD in her father; Depression in her daughter; Diabetes in her father, mother, paternal grandfather, and paternal grandmother; Heart attack in her father; Heart disease in her brother, father, and sister; Hyperlipidemia in her father; Hypertension in her brother, father, mother, paternal grandmother, and sister; Seizures in her paternal grandmother; Sleep apnea in her father. Social History:   reports that she has never smoked. She has never used smokeless tobacco. She reports current alcohol use. She reports that she does not use drugs.  Current Medications, Allergies, Past Medical History, Past Surgical History, Family History and Social History were reviewed in [employer] record.  Physical Exam: BP (!) 126/94   Pulse 90   Ht 5\' 3"  (1.6 m)   Wt 230 lb (104.3 kg)   BMI 40.74 kg/m  General:   Pleasant, well developed female in no acute distress Head:  Normocephalic and atraumatic. Eyes: sclerae anicteric,conjunctive pink  Heart:  regular rate and rhythm, no murmurs or gallops Pulm: Clear anteriorly; no wheezing Abdomen:  Soft, Obese AB, skin exam normal, Normal bowel sounds. mild  tenderness in the LLQ. Without guarding and Without rebound, without hepatomegaly. Rectal exam: normal external exam, no hemorrhoids, normal rectal tone, no masses, positive hemoccult Extremities:  Without edema. Msk:  Symmetrical without gross deformities. Peripheral pulses intact.  Neurologic:  Alert and  oriented x4;  grossly normal neurologically. Skin:   Dry and intact without significant lesions or rashes. Psychiatric: Demonstrates good judgement and reason without abnormal affect or behaviors.   , PA-C 01/12/22

## 2022-01-12 ENCOUNTER — Encounter (HOSPITAL_BASED_OUTPATIENT_CLINIC_OR_DEPARTMENT_OTHER): Payer: Self-pay | Admitting: Obstetrics

## 2022-01-12 ENCOUNTER — Encounter: Payer: Self-pay | Admitting: Physician Assistant

## 2022-01-12 ENCOUNTER — Ambulatory Visit: Payer: 59 | Admitting: Physician Assistant

## 2022-01-12 ENCOUNTER — Other Ambulatory Visit (HOSPITAL_COMMUNITY): Payer: Self-pay

## 2022-01-12 VITALS — BP 126/94 | HR 90 | Ht 63.0 in | Wt 230.0 lb

## 2022-01-12 DIAGNOSIS — K625 Hemorrhage of anus and rectum: Secondary | ICD-10-CM | POA: Diagnosis not present

## 2022-01-12 DIAGNOSIS — K219 Gastro-esophageal reflux disease without esophagitis: Secondary | ICD-10-CM | POA: Diagnosis not present

## 2022-01-12 DIAGNOSIS — K5904 Chronic idiopathic constipation: Secondary | ICD-10-CM | POA: Diagnosis not present

## 2022-01-12 MED ORDER — PLENVU 140 G PO SOLR
140.0000 g | ORAL | 0 refills | Status: DC
  Filled 2022-01-12: qty 3, 1d supply, fill #0

## 2022-01-12 MED ORDER — PANTOPRAZOLE SODIUM 20 MG PO TBEC
20.0000 mg | DELAYED_RELEASE_TABLET | Freq: Every day | ORAL | 0 refills | Status: DC
Start: 2022-01-12 — End: 2022-04-04
  Filled 2022-01-12: qty 90, 90d supply, fill #0

## 2022-01-12 NOTE — Patient Instructions (Addendum)
_______________________________________________________  If you are age 45 or older, your body mass index should be between 23-30. Your Body mass index is 40.74 kg/m. If this is out of the aforementioned range listed, please consider follow up with your Primary Care Provider.  If you are age 63 or younger, your body mass index should be between 19-25. Your Body mass index is 40.74 kg/m. If this is out of the aformentioned range listed, please consider follow up with your Primary Care Provider.   ________________________________________________________  The [facility] providers would like to encourage you to use [facility] to communicate with providers for non-urgent requests or questions.  Due to long hold times on the telephone, sending your provider a message by [facility] may be a faster and more efficient way to get a response.  Please allow 48 business hours for a response.  Please remember that this is for non-urgent requests.  _______________________________________________________  Bonita Quin have been scheduled for a colonoscopy. Please follow written instructions given to you at your visit today.  Please pick up your prep supplies at the pharmacy within the next 1-3 days. If you use inhalers (even only as needed), please bring them with you on the day of your procedure.  We have sent the following medications to your pharmacy for you to pick up at your convenience: Plenvu    Miralax is an osmotic laxative.  It only brings more water into the stool.  This is safe to take daily.  Can take up to 17 gram of miralax twice a day.  Mix with juice or coffee.  Start 1 capful at night for 3-4 days and reassess your response in 3-4 days.  You can increase and decrease the dose based on your response.  Remember, it can take up to 3-4 days to take effect OR for the effects to wear off.   I often pair this with benefiber in the morning to help assure the stool is not too loose.   Toileting tips to  help with your constipation - Drink at least 64-80 ounces of water/liquid per day. - Establish a time to try to move your bowels every day.  For many people, this is after a cup of coffee or after a meal such as breakfast. - Sit all of the way back on the toilet keeping your back fairly straight and while sitting up, try to rest the tops of your forearms on your upper thighs.   - Raising your feet with a step stool/squatty potty can be helpful to improve the angle that allows your stool to pass through the rectum. - Relax the rectum feeling it bulge toward the toilet water.  If you feel your rectum raising toward your body, you are contracting rather than relaxing. - Breathe in and slowly exhale. "Belly breath" by expanding your belly towards your belly button. Keep belly expanded as you gently direct pressure down and back to the anus.  A low pitched GRRR sound can assist with increasing intra-abdominal pressure.  - Repeat 3-4 times. If unsuccessful, contract the pelvic floor to restore normal tone and get off the toilet.  Avoid excessive straining. - To reduce excessive wiping by teaching your anus to normally contract, place hands on outer aspect of knees and resist knee movement outward.  Hold 5-10 second then place hands just inside of knees and resist inward movement of knees.  Hold 5 seconds.  Repeat a few times each way.  Please take your proton pump inhibitor medication, pantroprazole 20  mg 6-8 weeks with as needed after. Can also do pepcid as needed.   Please take this medication 30 minutes to 1 hour before meals- this makes it more effective.  Avoid spicy and acidic foods Avoid fatty foods Limit your intake of coffee, tea, alcohol, and carbonated drinks Work to maintain a healthy weight Keep the head of the bed elevated at least 3 inches with blocks or a wedge pillow if you are having any nighttime symptoms Stay upright for 2 hours after eating Avoid meals and snacks three to four hours  before bedtime  Gastroparesis- from wegovy Please do small frequent meals like 4-6 meals a day.  Eat and drink liquids at separate times.  Avoid high fiber foods, cook your vegetables, avoid high fat food.  Suggest spreading protein throughout the day (greek yogurt, glucerna, soft meat, milk, eggs) Choose soft foods that you can mash with a fork When you are more symptomatic, change to pureed foods foods and liquids.  Consider reading "Living well with Gastroparesis" by Reuel Derby Gastroparesis is a condition in which food takes longer than normal to empty from the stomach. This condition is also known as delayed gastric emptying. It is usually a long-term (chronic) condition. There is no cure, but there are treatments and things that you can do at home to help relieve symptoms. Treating the underlying condition that causes gastroparesis can also help relieve symptoms  It was a pleasure to see you today!  Thank you for trusting me with your gastrointestinal care!

## 2022-01-12 NOTE — Progress Notes (Signed)
Addendum: Reviewed and agree with assessment and management plan. Anel Creighton M, MD  

## 2022-01-16 ENCOUNTER — Encounter (HOSPITAL_BASED_OUTPATIENT_CLINIC_OR_DEPARTMENT_OTHER): Payer: Self-pay | Admitting: Obstetrics

## 2022-01-16 NOTE — Progress Notes (Signed)
Spoke w/ via phone for pre-op interview--- pt Lab needs dos---- cbc, urine preg              Lab results------ current ekg in epic/ chart COVID test -----patient states asymptomatic no test needed Arrive at ------- 0530 on 01-22-2022 NPO after MN NO Solid Food.  Clear liquids from MN until--- 0430 Med rec completed Medications to take morning of surgery ----- buspar, effexor, abilify, protonix Diabetic medication ----- n/a Patient instructed no nail polish to be worn day of surgery Patient instructed to bring photo id and insurance card day of surgery Patient aware to have Driver (ride ) / caregiver    for 24 hours after surgery -- mother, syliva Patient Special Instructions ----- asked to bring rescue inhaler dos Pre-Op special Istructions ----- n/a Patient verbalized understanding of instructions that were given at this phone interview. Patient denies shortness of breath, chest pain, fever, cough at this phone interview.

## 2022-01-19 NOTE — Anesthesia Preprocedure Evaluation (Signed)
Anesthesia Evaluation  Patient identified by MRN, date of birth, ID band Patient awake    Reviewed: Allergy & Precautions, NPO status , Patient's Chart, lab work & pertinent test results  Airway Mallampati: II  TM Distance: >3 FB Neck ROM: Full    Dental no notable dental hx. (+) Dental Advisory Given, Teeth Intact   Pulmonary asthma ,    Pulmonary exam normal breath sounds clear to auscultation       Cardiovascular negative cardio ROS Normal cardiovascular exam Rhythm:Regular Rate:Normal     Neuro/Psych  Headaches, PSYCHIATRIC DISORDERS Anxiety Depression Bipolar Disorder Dementia    GI/Hepatic Neg liver ROS, hiatal hernia, GERD  Medicated and Controlled,  Endo/Other  Morbid obesity  Renal/GU negative Renal ROS     Musculoskeletal negative musculoskeletal ROS (+)   Abdominal (+) + obese,   Peds  Hematology negative hematology ROS (+)   Anesthesia Other Findings   Reproductive/Obstetrics                            Anesthesia Physical Anesthesia Plan  ASA: 3  Anesthesia Plan: General   Post-op Pain Management: Tylenol PO (pre-op)*, Toradol IV (intra-op)* and Minimal or no pain anticipated   Induction: Intravenous  PONV Risk Score and Plan: 4 or greater and Ondansetron, Dexamethasone, Treatment may vary due to age or medical condition, Midazolam and Scopolamine patch - Pre-op  Airway Management Planned: Oral ETT  Additional Equipment: None  Intra-op Plan:   Post-operative Plan: Extubation in OR  Informed Consent: I have reviewed the patients History and Physical, chart, labs and discussed the procedure including the risks, benefits and alternatives for the proposed anesthesia with the patient or authorized representative who has indicated his/her understanding and acceptance.     Dental advisory given  Plan Discussed with: CRNA  Anesthesia Plan Comments: (Pt on GLP-1  agonist. Reflux controlled on PPI, but GLP-1 agonist now worsening her sx. Pt. denies nausea or reflux today. Will proceed with GETA.)       Anesthesia Quick Evaluation

## 2022-01-20 ENCOUNTER — Other Ambulatory Visit: Payer: Self-pay | Admitting: Physician Assistant

## 2022-01-22 ENCOUNTER — Encounter (HOSPITAL_BASED_OUTPATIENT_CLINIC_OR_DEPARTMENT_OTHER)
Admission: RE | Disposition: A | Discharge status: Home / Self Care | Payer: Self-pay | Source: Home / Self Care | Attending: Obstetrics

## 2022-01-22 ENCOUNTER — Encounter: Payer: Self-pay | Admitting: Internal Medicine

## 2022-01-22 ENCOUNTER — Ambulatory Visit (HOSPITAL_BASED_OUTPATIENT_CLINIC_OR_DEPARTMENT_OTHER)
Admission: RE | Admit: 2022-01-22 | Discharge: 2022-01-22 | Disposition: A | Discharge status: Home / Self Care | Payer: 59 | Attending: Obstetrics | Admitting: Obstetrics

## 2022-01-22 ENCOUNTER — Other Ambulatory Visit (HOSPITAL_COMMUNITY): Payer: Self-pay

## 2022-01-22 ENCOUNTER — Other Ambulatory Visit: Payer: Self-pay

## 2022-01-22 ENCOUNTER — Ambulatory Visit (HOSPITAL_BASED_OUTPATIENT_CLINIC_OR_DEPARTMENT_OTHER): Payer: 59 | Admitting: Anesthesiology

## 2022-01-22 ENCOUNTER — Encounter (HOSPITAL_BASED_OUTPATIENT_CLINIC_OR_DEPARTMENT_OTHER): Payer: Self-pay | Admitting: Obstetrics

## 2022-01-22 DIAGNOSIS — Z6839 Body mass index (BMI) 39.0-39.9, adult: Secondary | ICD-10-CM

## 2022-01-22 DIAGNOSIS — N871 Moderate cervical dysplasia: Secondary | ICD-10-CM | POA: Diagnosis not present

## 2022-01-22 DIAGNOSIS — N870 Mild cervical dysplasia: Secondary | ICD-10-CM | POA: Diagnosis not present

## 2022-01-22 DIAGNOSIS — F418 Other specified anxiety disorders: Secondary | ICD-10-CM | POA: Diagnosis not present

## 2022-01-22 DIAGNOSIS — Z01818 Encounter for other preprocedural examination: Secondary | ICD-10-CM

## 2022-01-22 HISTORY — DX: Bipolar disorder, unspecified: F31.9

## 2022-01-22 HISTORY — DX: Panic disorder (episodic paroxysmal anxiety): F41.0

## 2022-01-22 HISTORY — DX: Diaphragmatic hernia without obstruction or gangrene: K44.9

## 2022-01-22 HISTORY — DX: Moderate cervical dysplasia: N87.1

## 2022-01-22 HISTORY — PX: CERVICAL CONIZATION W/BX: SHX1330

## 2022-01-22 HISTORY — DX: Other constipation: K59.09

## 2022-01-22 HISTORY — PX: COLPOSCOPY: SHX161

## 2022-01-22 HISTORY — DX: Presence of spectacles and contact lenses: Z97.3

## 2022-01-22 HISTORY — DX: Urgency of urination: R39.15

## 2022-01-22 HISTORY — DX: Generalized anxiety disorder: F41.1

## 2022-01-22 HISTORY — DX: Migraine without aura, not intractable, without status migrainosus: G43.009

## 2022-01-22 HISTORY — DX: Personal history of other diseases of the circulatory system: Z86.79

## 2022-01-22 HISTORY — DX: Major depressive disorder, single episode, unspecified: F32.9

## 2022-01-22 HISTORY — DX: Personal history of diseases of the blood and blood-forming organs and certain disorders involving the immune mechanism: Z86.2

## 2022-01-22 LAB — CBC
HCT: 43.9 % (ref 36.0–46.0)
Hemoglobin: 14.1 g/dL (ref 12.0–15.0)
MCH: 27.9 pg (ref 26.0–34.0)
MCHC: 32.1 g/dL (ref 30.0–36.0)
MCV: 86.9 fL (ref 80.0–100.0)
Platelets: 248 10*3/uL (ref 150–400)
RBC: 5.05 MIL/uL (ref 3.87–5.11)
RDW: 13.3 % (ref 11.5–15.5)
WBC: 5.7 10*3/uL (ref 4.0–10.5)
nRBC: 0 % (ref 0.0–0.2)

## 2022-01-22 LAB — POCT PREGNANCY, URINE: Preg Test, Ur: NEGATIVE

## 2022-01-22 SURGERY — CONE BIOPSY, CERVIX
Anesthesia: General | Site: Cervix

## 2022-01-22 MED ORDER — FENTANYL CITRATE (PF) 100 MCG/2ML IJ SOLN
INTRAMUSCULAR | Status: AC
  Filled 2022-01-22: qty 2

## 2022-01-22 MED ORDER — PHENYLEPHRINE 80 MCG/ML (10ML) SYRINGE FOR IV PUSH (FOR BLOOD PRESSURE SUPPORT)
PREFILLED_SYRINGE | INTRAVENOUS | Status: AC
  Filled 2022-01-22: qty 10

## 2022-01-22 MED ORDER — ZOLPIDEM TARTRATE ER 12.5 MG PO TBCR
12.5000 mg | EXTENDED_RELEASE_TABLET | Freq: Every evening | ORAL | 5 refills | Status: DC | PRN
  Filled 2022-01-22: qty 30, 30d supply, fill #0
  Filled 2022-03-03: qty 30, 30d supply, fill #1
  Filled 2022-04-11: qty 30, 30d supply, fill #2
  Filled 2022-05-18: qty 30, 30d supply, fill #3
  Filled 2022-06-24: qty 30, 30d supply, fill #4

## 2022-01-22 MED ORDER — PROPOFOL 10 MG/ML IV BOLUS
INTRAVENOUS | Status: DC | PRN
  Administered 2022-01-22: 50 mg via INTRAVENOUS
  Administered 2022-01-22: 200 mg via INTRAVENOUS

## 2022-01-22 MED ORDER — AMISULPRIDE (ANTIEMETIC) 5 MG/2ML IV SOLN
10.0000 mg | Freq: Once | INTRAVENOUS | Status: DC | PRN
Start: 2022-01-22 — End: 2022-01-22

## 2022-01-22 MED ORDER — SUCCINYLCHOLINE CHLORIDE 200 MG/10ML IV SOSY
PREFILLED_SYRINGE | INTRAVENOUS | Status: AC
  Filled 2022-01-22: qty 10

## 2022-01-22 MED ORDER — MIDAZOLAM HCL 2 MG/2ML IJ SOLN
INTRAMUSCULAR | Status: DC | PRN
  Administered 2022-01-22: 2 mg via INTRAVENOUS

## 2022-01-22 MED ORDER — FENTANYL CITRATE (PF) 100 MCG/2ML IJ SOLN
INTRAMUSCULAR | Status: DC | PRN
  Administered 2022-01-22 (×2): 50 ug via INTRAVENOUS

## 2022-01-22 MED ORDER — LACTATED RINGERS IV SOLN
INTRAVENOUS | Status: DC
Start: 2022-01-22 — End: 2022-01-22

## 2022-01-22 MED ORDER — 0.9 % SODIUM CHLORIDE (POUR BTL) OPTIME
TOPICAL | Status: DC | PRN
  Administered 2022-01-22: 500 mL

## 2022-01-22 MED ORDER — KETOROLAC TROMETHAMINE 30 MG/ML IJ SOLN
INTRAMUSCULAR | Status: DC | PRN
  Administered 2022-01-22: 30 mg via INTRAVENOUS

## 2022-01-22 MED ORDER — MIDAZOLAM HCL 2 MG/2ML IJ SOLN
INTRAMUSCULAR | Status: AC
  Filled 2022-01-22: qty 2

## 2022-01-22 MED ORDER — PROPOFOL 10 MG/ML IV BOLUS
INTRAVENOUS | Status: AC
  Filled 2022-01-22: qty 20

## 2022-01-22 MED ORDER — ACETAMINOPHEN 500 MG PO TABS
ORAL_TABLET | ORAL | Status: AC
  Filled 2022-01-22: qty 2

## 2022-01-22 MED ORDER — IODINE STRONG (LUGOLS) 5 % PO SOLN
ORAL | Status: DC | PRN
  Administered 2022-01-22: 14 mL

## 2022-01-22 MED ORDER — ONDANSETRON HCL 4 MG/2ML IJ SOLN
INTRAMUSCULAR | Status: AC
  Filled 2022-01-22: qty 2

## 2022-01-22 MED ORDER — PROPOFOL 1000 MG/100ML IV EMUL
INTRAVENOUS | Status: AC
  Filled 2022-01-22: qty 100

## 2022-01-22 MED ORDER — PHENYLEPHRINE 80 MCG/ML (10ML) SYRINGE FOR IV PUSH (FOR BLOOD PRESSURE SUPPORT)
PREFILLED_SYRINGE | INTRAVENOUS | Status: DC | PRN
  Administered 2022-01-22 (×2): 80 ug via INTRAVENOUS

## 2022-01-22 MED ORDER — DEXAMETHASONE SODIUM PHOSPHATE 10 MG/ML IJ SOLN
INTRAMUSCULAR | Status: DC | PRN
  Administered 2022-01-22: 5 mg via INTRAVENOUS

## 2022-01-22 MED ORDER — DEXAMETHASONE SODIUM PHOSPHATE 10 MG/ML IJ SOLN
INTRAMUSCULAR | Status: AC
  Filled 2022-01-22: qty 1

## 2022-01-22 MED ORDER — PROMETHAZINE HCL 25 MG/ML IJ SOLN: 6.2500 mg | INTRAMUSCULAR | Status: DC | PRN

## 2022-01-22 MED ORDER — FERRIC SUBSULFATE 259 MG/GM EX SOLN
CUTANEOUS | Status: DC | PRN
  Administered 2022-01-22: 1 via TOPICAL

## 2022-01-22 MED ORDER — ONDANSETRON HCL 4 MG/2ML IJ SOLN
INTRAMUSCULAR | Status: DC | PRN
  Administered 2022-01-22: 4 mg via INTRAVENOUS

## 2022-01-22 MED ORDER — LIDOCAINE 2% (20 MG/ML) 5 ML SYRINGE
INTRAMUSCULAR | Status: DC | PRN
  Administered 2022-01-22: 100 mg via INTRAVENOUS

## 2022-01-22 MED ORDER — SUCCINYLCHOLINE CHLORIDE 200 MG/10ML IV SOSY
PREFILLED_SYRINGE | INTRAVENOUS | Status: DC | PRN
  Administered 2022-01-22: 100 mg via INTRAVENOUS
  Administered 2022-01-22: 40 mg via INTRAVENOUS

## 2022-01-22 MED ORDER — LIDOCAINE HCL (PF) 2 % IJ SOLN
INTRAMUSCULAR | Status: AC
  Filled 2022-01-22: qty 5

## 2022-01-22 MED ORDER — LIDOCAINE-EPINEPHRINE (PF) 1 %-1:200000 IJ SOLN
INTRAMUSCULAR | Status: DC | PRN
  Administered 2022-01-22: 15 mL

## 2022-01-22 MED ORDER — FENTANYL CITRATE (PF) 100 MCG/2ML IJ SOLN: 25.0000 ug | INTRAMUSCULAR | Status: DC | PRN

## 2022-01-22 MED ORDER — LACTATED RINGERS IV SOLN: INTRAVENOUS | Status: DC

## 2022-01-22 MED ORDER — ALBUTEROL SULFATE HFA 108 (90 BASE) MCG/ACT IN AERS
INHALATION_SPRAY | RESPIRATORY_TRACT | Status: AC
  Filled 2022-01-22: qty 6.7

## 2022-01-22 MED ORDER — ACETAMINOPHEN 500 MG PO TABS
1000.0000 mg | ORAL_TABLET | ORAL | Status: AC
Start: 2022-01-22 — End: 2022-01-22
  Administered 2022-01-22: 1000 mg via ORAL

## 2022-01-22 MED ORDER — HEMOSTATIC AGENTS (NO CHARGE) OPTIME
TOPICAL | Status: DC | PRN
  Administered 2022-01-22: 1 via TOPICAL

## 2022-01-22 MED ORDER — MEPERIDINE HCL 25 MG/ML IJ SOLN: 6.2500 mg | INTRAMUSCULAR | Status: DC | PRN

## 2022-01-22 SURGICAL SUPPLY — 25 items
APL SWBSTK 6 STRL LF DISP (MISCELLANEOUS) ×2
APPLICATOR COTTON TIP 6 STRL (MISCELLANEOUS) IMPLANT
APPLICATOR COTTON TIP 6IN STRL (MISCELLANEOUS) ×2
BLADE SURG 11 STRL SS (BLADE) ×1 IMPLANT
ELECT BALL LEEP 5MM RED (ELECTRODE) IMPLANT
GLOVE BIO SURGEON STRL SZ 6 (GLOVE) ×1 IMPLANT
GLOVE BIOGEL PI IND STRL 6.5 (GLOVE) ×1 IMPLANT
GLOVE BIOGEL PI IND STRL 7.0 (GLOVE) ×1 IMPLANT
GLOVE BIOGEL PI INDICATOR 6.5 (GLOVE) ×2
GLOVE BIOGEL PI INDICATOR 7.0 (GLOVE) ×1
GOWN STRL REUS W/ TWL LRG LVL3 (GOWN DISPOSABLE) ×2 IMPLANT
GOWN STRL REUS W/TWL LRG LVL3 (GOWN DISPOSABLE) ×4
NS IRRIG 1000ML POUR BTL (IV SOLUTION) ×1 IMPLANT
PACK VAGINAL WOMENS (CUSTOM PROCEDURE TRAY) ×1 IMPLANT
PAD OB MATERNITY 4.3X12.25 (PERSONAL CARE ITEMS) ×1 IMPLANT
PENCIL BUTTON HOLSTER BLD 10FT (ELECTRODE) IMPLANT
PENCIL SMOKE EVACUATOR (MISCELLANEOUS) IMPLANT
SCOPETTES 8  STERILE (MISCELLANEOUS) ×1
SCOPETTES 8 STERILE (MISCELLANEOUS) ×2 IMPLANT
SPONGE SURGIFOAM ABS GEL 12-7 (HEMOSTASIS) IMPLANT
SUT CHROMIC 1 CT1 27 (SUTURE) IMPLANT
SUT VIC AB 0 CT1 27 (SUTURE) ×5
SUT VIC AB 0 CT1 27XBRD ANBCTR (SUTURE) IMPLANT
TOWEL OR 17X26 10 PK STRL BLUE (TOWEL DISPOSABLE) ×2 IMPLANT
YANKAUER SUCT BULB TIP NO VENT (SUCTIONS) ×1 IMPLANT

## 2022-01-22 NOTE — Discharge Instructions (Signed)
PLEASE SCHEDULE A FOLLOW UP APPOINTMENT WITH DR. CLARK IN 4 WEEKS.      No ibuprofen, Advil, Aleve, Motrin, ketorolac, meloxicam, naproxen, or other NSAIDS until after 2:00 pm today if needed.   No acetaminophen/Tylenol until after 12:00 pm today if needed.      Post Anesthesia Home Care Instructions  Activity: Get plenty of rest for the remainder of the day. A responsible individual must stay with you for 24 hours following the procedure.  For the next 24 hours, DO NOT: -Drive a car -Advertising copywriter -Drink alcoholic beverages -Take any medication unless instructed by your physician -Make any legal decisions or sign important papers.  Meals: Start with liquid foods such as gelatin or soup. Progress to regular foods as tolerated. Avoid greasy, spicy, heavy foods. If nausea and/or vomiting occur, drink only clear liquids until the nausea and/or vomiting subsides. Call your physician if vomiting continues.  Special Instructions/Symptoms: Your throat may feel dry or sore from the anesthesia or the breathing tube placed in your throat during surgery. If this causes discomfort, gargle with warm salt water. The discomfort should disappear within 24 hours.

## 2022-01-22 NOTE — Anesthesia Postprocedure Evaluation (Signed)
Anesthesia Post Note  Patient: Shannon Gould  Procedure(s) Performed: CONIZATION CERVIX WITH BIOPSY: COLD KNIFE CONE (Cervix)     Patient location during evaluation: PACU Anesthesia Type: General Level of consciousness: sedated and patient cooperative Pain management: pain level controlled Vital Signs Assessment: post-procedure vital signs reviewed and stable Respiratory status: spontaneous breathing Cardiovascular status: stable Anesthetic complications: no   No notable events documented.  Last Vitals:  Vitals:   01/22/22 0915 01/22/22 0951  BP: (!) 123/52 126/72  Pulse: (!) 102 100  Resp: 12 16  Temp:  37 C  SpO2: 97% 100%    Last Pain:  Vitals:   01/22/22 0951  TempSrc:   PainSc: 2                  Lewie Loron

## 2022-01-22 NOTE — Transfer of Care (Signed)
Immediate Anesthesia Transfer of Care Note  Patient: Shannon Gould  Procedure(s) Performed: CONIZATION CERVIX WITH BIOPSY: COLD KNIFE CONE (Cervix)  Patient Location: PACU  Anesthesia Type:General  Level of Consciousness: awake, alert , oriented and patient cooperative  Airway & Oxygen Therapy: Patient Spontanous Breathing  Post-op Assessment: Report given to RN and Post -op Vital signs reviewed and stable  Post vital signs: Reviewed and stable  Last Vitals:  Vitals Value Taken Time  BP 129/53 01/22/22 0845  Temp    Pulse 115 01/22/22 0848  Resp 16 01/22/22 0848  SpO2 94 % 01/22/22 0848  Vitals shown include unvalidated device data.  Last Pain:  Vitals:   01/22/22 0600  TempSrc: Oral  PainSc: 0-No pain      Patients Stated Pain Goal: 3 (01/22/22 0600)  Complications: No notable events documented.

## 2022-01-22 NOTE — Anesthesia Procedure Notes (Signed)
Procedure Name: Intubation Date/Time: 01/22/2022 7:22 AM  Performed by: Rogers Blocker, CRNAPre-anesthesia Checklist: Patient identified, Emergency Drugs available, Suction available and Patient being monitored Patient Re-evaluated:Patient Re-evaluated prior to induction Oxygen Delivery Method: Circle System Utilized Preoxygenation: Pre-oxygenation with 100% oxygen Induction Type: IV induction Ventilation: Mask ventilation without difficulty Laryngoscope Size: Mac and 3 Grade View: Grade I Tube type: Oral Tube size: 7.0 mm Number of attempts: 1 Airway Equipment and Method: Stylet and Bite block Placement Confirmation: ETT inserted through vocal cords under direct vision, positive ETCO2 and breath sounds checked- equal and bilateral Secured at: 22 cm Tube secured with: Tape Dental Injury: Teeth and Oropharynx as per pre-operative assessment

## 2022-01-22 NOTE — Op Note (Signed)
Operative Note             Preoperative Diagnosis: Cervical intraepithelial neoplasia 2   Postioerative Diagnosis: Cervical intraepithelial neoplasia 2   Procedure: Cold knife cone   Surgeon: Marlow Baars, MD   Assistant: Derl Barrow, MD   Anesthesia:   general   EBL:  50 mL    Specimens:  cone biopsy and post-cone endocervical curettage   Findings: Lugols applied, cervix completely stained. There were no areas of reduced uptake.  IUD strings visible at the start of the case.  T  Description of procedure After consent was verified, the patient was taken to the operating room where general anesthesia was administered without difficulty. The patient was placed in the dorsal lithotomy position using Allen stirrups.  The vagina were subsequently prepped and draped in the normal sterile fashion. Bladder was drained with in and out catheter. Two Devear retractors and a sidewall retractor were placed in the patient's vagina.  The cervix was visualized with short IUD strings at the os.  Lugols was applied on the cervix and it was completely stained. There were no areas of poor uptake. The cervix was grasped with a tenaculum.  Stay sutures were placed at 3 and 9 o'clock with vicryl which was tied down and held.  The cervix was then injected circumferentially with 1% lidocaine with epinephrine.  Visualization was difficult with the tenaculum in place, so two sutures were placed at 12 and 6  o'clock on the face of the cervix and the tenaculum removed.  A uterine sound was placed through the os to easily identify the endocervical canal. A #11 blade scalpel was used to circumferentially excise the central portion of the cervix. The cone specimen was handed off with a suture marking it at 12 o'clock. The endocervical curettage was performed without difficulty. The base of the cone was cauterized and excellent hemostasis was noted the the exception of one area at the posterior aspect of the cervix.  A  running locked whip stitch was placed from 8 to 4 o'clock with some improvement in bleeding.  Monsel's was placed on the cone bed.  There was still one area of persistent oozing so surgifoam was placed on the cone bed and the stay sutures tied together in the midline and cut. At this time, hemostasis was excellent.  All instruments were removed.  The patient tolerated the procedure well and was taken to the recovery room in stable condition. Sponge, lap, and needle counts were correct x3.   An experienced assistant was required given the standard of surgical care given the complexity of the case.  The cervix was high in the vagina with redundant tissue of the posterior vagina and vaginal sidewalls that made visualization in the office unachievable.  This assistant was needed for exposure, suctioning, retraction, instrument exchange, and for overall help during the procedure.

## 2022-01-22 NOTE — H&P (Signed)
46 y.o. [id number] presents for cold  knife cone for CIN 2.  She had a cytology negative, high risk HPV positive pap in 2022 and 2023.  In 2023, colposcopy progressed from CIN 1 to CIN 2.  Biopsy of ectocervix returned CIN 2 but ECC was benign.  Attempt was made to perform a LEEP in the office, but patient anatomy prevented appropriate visualization of the cervix with the available coated LEEP speculums    Past Medical History:  Diagnosis Date   Asthma    Bipolar 1 disorder (HCC)    Chronic constipation    CIN II (cervical intraepithelial neoplasia II)    GAD (generalized anxiety disorder)    GERD (gastroesophageal reflux disease)    Hiatal hernia    History of iron deficiency anemia    History of palpitations in adulthood    pt evaluated by cardiologist--- dr b. Cristal Deer note in epic 09-06-2021  for atypical chest pain/ palpitations per not pt  had echo approxl 2013 and normal ETT 2020 not further testing and released prn basis   MDD (major depressive disorder)    Migraine without aura and without status migrainosus, not intractable    neurologist--- dr Everlena Cooper   Panic disorder    Urgency of urination    Wears glasses     Past Surgical History:  Procedure Laterality Date   CYSTOSCOPY WITH URETHRAL DILATATION     age 68   ERCP  2006   @[facility]    LAPAROSCOPIC CHOLECYSTECTOMY  2002   @[facility]    REMOVAL OF URINARY SLING  02/17/2018   @[facility] ;   vaginal urethrolysis , partial removal midurethral sling   URETHRAL SLING  08/08/2010   @[facility]  by dr 02/19/2018;    Tension-free vaginal tape    OB History  No obstetric history on file.    Social History   Socioeconomic History   Marital status: Single    Spouse name: Not on file   Number of children: 3   Years of education: Not on file   Highest education level: Associate degree: occupational, , or vocational program  Occupational History   Occupation: LPN    Employer: [employer]  Tobacco Use   Smoking status: Never    Smokeless tobacco: Never  Vaping Use   Vaping Use: Never used  Substance and Sexual Activity   Alcohol use: Not Currently    Comment: rare   Drug use: Never   Sexual activity: Yes    Birth control/protection: I.U.D.  Other Topics Concern   Not on file  Social History Narrative   Patient is left-handed. She lives in a one level home with 2 of her children, one is at college. She avoids caffeine. She does not exercise.   Social Determinants of Health   Financial Resource Strain: Not on file  Food Insecurity: Not on file  Transportation Needs: Not on file  Physical Activity: Not on file  Stress: Not on file  Social Connections: Not on file  Intimate Partner Violence: Not on file   Benzodiazepines, Hydrocodone, Lexapro [escitalopram], Wellbutrin [bupropion], Caffeine, and Lactose intolerance (gi)    Vitals:   01/22/22 0600  BP: 139/81  Pulse: 85  Resp: 18  Temp: 98.1 F (36.7 C)  SpO2: 100%     General:  NAD Abdomen:  soft Vagina: normal, cervix visibly normal without lesion Ex:  no edema  UPT negative in holding  A/P   46 y.o. Scientist, product/process development presents for cold knife conization for CIN 2 Discussed risks  to include infection, bleeding, damage to surrounding structures (including but not limited to vagina, cervix, bladder, bowel, uterus), positive margins potentially requiring repeat excision, need for additional procedures.  All questions answered and patient elects to proceed.   [facility] GEFFEL [employer]

## 2022-01-23 ENCOUNTER — Encounter (HOSPITAL_BASED_OUTPATIENT_CLINIC_OR_DEPARTMENT_OTHER): Payer: Self-pay | Admitting: Obstetrics

## 2022-01-24 ENCOUNTER — Ambulatory Visit: Payer: 59 | Admitting: Podiatry

## 2022-01-24 ENCOUNTER — Encounter (HOSPITAL_BASED_OUTPATIENT_CLINIC_OR_DEPARTMENT_OTHER): Payer: Self-pay | Admitting: Cardiology

## 2022-01-25 LAB — SURGICAL PATHOLOGY

## 2022-01-29 ENCOUNTER — Ambulatory Visit (AMBULATORY_SURGERY_CENTER): Payer: 59 | Admitting: Internal Medicine

## 2022-01-29 ENCOUNTER — Encounter: Payer: Self-pay | Admitting: Internal Medicine

## 2022-01-29 VITALS — BP 122/71 | HR 84 | Temp 98.0°F | Resp 18 | Ht 63.0 in | Wt 230.0 lb

## 2022-01-29 DIAGNOSIS — K625 Hemorrhage of anus and rectum: Secondary | ICD-10-CM

## 2022-01-29 DIAGNOSIS — K5909 Other constipation: Secondary | ICD-10-CM

## 2022-01-29 DIAGNOSIS — R195 Other fecal abnormalities: Secondary | ICD-10-CM

## 2022-01-29 DIAGNOSIS — F319 Bipolar disorder, unspecified: Secondary | ICD-10-CM | POA: Diagnosis not present

## 2022-01-29 DIAGNOSIS — J45909 Unspecified asthma, uncomplicated: Secondary | ICD-10-CM | POA: Diagnosis not present

## 2022-01-29 MED ORDER — SODIUM CHLORIDE 0.9 % IV SOLN: 500.0000 mL | Freq: Once | INTRAVENOUS | Status: DC

## 2022-01-29 NOTE — Progress Notes (Signed)
Pt's states no medical or surgical changes since previsit or office visit.   Patient had colposcopy last Monday, August 21at [facility].

## 2022-01-29 NOTE — Patient Instructions (Signed)
Resume previous medications.  Handouts on findings given to patient.     Repeat colonoscopy in 10 years.   Retrial of 8.5 grams(1/2 dose)  Miralax daily. If ineffective or diarrhea please call the office.   YOU HAD AN ENDOSCOPIC PROCEDURE TODAY AT THE [facility]:   Refer to the procedure report that was given to you for any specific questions about what was found during the examination.  If the procedure report does not answer your questions, please call your gastroenterologist to clarify.  If you requested that your care partner not be given the details of your procedure findings, then the procedure report has been included in a sealed envelope for you to review at your convenience later.  YOU SHOULD EXPECT: Some feelings of bloating in the abdomen. Passage of more gas than usual.  Walking can help get rid of the air that was put into your GI tract during the procedure and reduce the bloating. If you had a lower endoscopy (such as a colonoscopy or flexible sigmoidoscopy) you may notice spotting of blood in your stool or on the toilet paper. If you underwent a bowel prep for your procedure, you may not have a normal bowel movement for a few days.  Please Note:  You might notice some irritation and congestion in your nose or some drainage.  This is from the oxygen used during your procedure.  There is no need for concern and it should clear up in a day or so.  SYMPTOMS TO REPORT IMMEDIATELY:  Following lower endoscopy (colonoscopy or flexible sigmoidoscopy):  Excessive amounts of blood in the stool  Significant tenderness or worsening of abdominal pains  Swelling of the abdomen that is new, acute  Fever of 100F or higher   For urgent or emergent issues, a gastroenterologist can be reached at any hour by calling [phone]. Do not use MyChart messaging for urgent concerns.    DIET:  We do recommend a small meal at first, but then you may proceed to your regular diet.  Drink  plenty of fluids but you should avoid alcoholic beverages for 24 hours.  ACTIVITY:  You should plan to take it easy for the rest of today and you should NOT DRIVE or use heavy machinery until tomorrow (because of the sedation medicines used during the test).    FOLLOW UP: Our staff will call the number listed on your records the next business day following your procedure.  We will call around 7:15- 8:00 am to check on you and address any questions or concerns that you may have regarding the information given to you following your procedure. If we do not reach you, we will leave a message.  If you develop any symptoms (ie: fever, flu-like symptoms, shortness of breath, cough etc.) before then, please call [phone].  If you test positive for Covid 19 in the 2 weeks post procedure, please call and report this information to [place].    If any biopsies were taken you will be contacted by phone or by letter within the next 1-3 weeks.  Please call us at [phone] if you have not heard about the biopsies in 3 weeks.    SIGNATURES/CONFIDENTIALITY: You and/or your care partner have signed paperwork which will be entered into your electronic medical record.  These signatures attest to the fact that that the information above on your After Visit Summary has been reviewed and is understood.  Full responsibility of the confidentiality of this discharge information  lies with you and/or your care-partner.  

## 2022-01-29 NOTE — Progress Notes (Signed)
Report to PACU, RN, vss, BBS= Clear.  

## 2022-01-29 NOTE — Progress Notes (Signed)
See office note dated 01/12/2022 for details and current H&P  Colonoscopy for positive FOBT and history of constipation  She is appropriate for colonoscopy here today.

## 2022-01-29 NOTE — Op Note (Signed)
[facility] Patient Name: Shannon Gould Procedure Date: 01/29/2022 3:48 PM MRN: [id number] Endoscopist: Beverley Fiedler , MD Age: 46 Referring MD:  Date of Birth: 1976/05/16 Gender: Female Account #: [id number] Procedure:                Colonoscopy Indications:              Heme positive stool, chronic constipation, now                            healed anal fissure Medicines:                Monitored Anesthesia Care Procedure:                Pre-Anesthesia Assessment:                           - Prior to the procedure, a History and Physical                            was performed, and patient medications and                            allergies were reviewed. The patient's tolerance of                            previous anesthesia was also reviewed. The risks                            and benefits of the procedure and the sedation                            options and risks were discussed with the patient.                            All questions were answered, and informed consent                            was obtained. Prior Anticoagulants: The patient has                            taken no previous anticoagulant or antiplatelet                            agents. ASA Grade Assessment: III - A patient with                            severe systemic disease. After reviewing the risks                            and benefits, the patient was deemed in                            satisfactory condition to undergo the procedure.  After obtaining informed consent, the colonoscope                            was passed under direct vision. Throughout the                            procedure, the patient's blood pressure, pulse, and                            oxygen saturations were monitored continuously. The                            CF [id number] was introduced through the anus                            and advanced to the cecum, identified  by                            appendiceal orifice and ileocecal valve. The                            colonoscopy was performed without difficulty. The                            patient tolerated the procedure well. The quality                            of the bowel preparation was good. The ileocecal                            valve, appendiceal orifice, and rectum were                            photographed. Scope In: 3:52:08 PM Scope Out: 4:07:52 PM Scope Withdrawal Time: 0 hours 9 minutes 50 seconds  Total Procedure Duration: 0 hours 15 minutes 44 seconds  Findings:                 The digital rectal exam was normal.                           There was a medium-sized lipoma, in the proximal                            transverse colon.                           The exam was otherwise without abnormality on                            direct and retroflexion views. Complications:            No immediate complications. Estimated Blood Loss:     Estimated blood loss: none. Impression:               - Medium-sized lipoma in the proximal transverse  colon.                           - The examination was otherwise normal on direct                            and retroflexion views.                           - No specimens collected. Recommendation:           - Patient has a contact number available for                            emergencies. The signs and symptoms of potential                            delayed complications were discussed with the                            patient. Return to normal activities tomorrow.                            Written discharge instructions were provided to the                            patient.                           - Resume previous diet.                           - Continue present medications. Retrial of 8.5 g                            (half dose) MiraLax daily. If ineffective or                             diarrhea please call my office and we can try a                            different medication for chronic constipation.                           - Repeat colonoscopy in 10 years for screening                            purposes. Beverley Fiedler, MD 01/29/2022 4:11:10 PM This report has been signed electronically.

## 2022-01-30 ENCOUNTER — Telehealth: Payer: Self-pay | Admitting: *Deleted

## 2022-01-30 NOTE — Telephone Encounter (Signed)
No answer on follow up call. Mail box full. Unable to leave message

## 2022-02-01 ENCOUNTER — Other Ambulatory Visit (HOSPITAL_COMMUNITY): Payer: Self-pay

## 2022-02-01 ENCOUNTER — Other Ambulatory Visit (HOSPITAL_BASED_OUTPATIENT_CLINIC_OR_DEPARTMENT_OTHER): Payer: Self-pay | Admitting: Cardiology

## 2022-02-01 MED ORDER — WEGOVY 1.7 MG/0.75ML ~~LOC~~ SOAJ
1.7000 mg | SUBCUTANEOUS | 0 refills | Status: DC
  Filled 2022-02-01: qty 3, 28d supply, fill #0

## 2022-02-01 NOTE — Telephone Encounter (Signed)
Please help with refill of Wegovy. Thank you!!

## 2022-02-02 ENCOUNTER — Ambulatory Visit: Payer: 59 | Admitting: Podiatry

## 2022-02-02 ENCOUNTER — Encounter: Payer: Self-pay | Admitting: Podiatry

## 2022-02-02 DIAGNOSIS — M7661 Achilles tendinitis, right leg: Secondary | ICD-10-CM | POA: Diagnosis not present

## 2022-02-02 NOTE — Progress Notes (Signed)
Subjective:   Patient ID: Shannon Gould, female   DOB: 46 y.o.   MRN: [id number]   HPI Patient states she is doing much better with significant diminishment of her discomfort   ROS      Objective:  Physical Exam  Neurovascular status with significant diminishment of discomfort in the posterior heel medial side with diminishment of inflammation excellent range of motion and patient very happy with how this is doing     Assessment:  Significant improvement with Achilles tendinitis right with significant reduction inflammation     Plan:  H&P reviewed condition went ahead today discussed physical therapy dispensed heel lift lift up the arch gave instructions for ice as needed and if symptoms persist I want to see her back.  All education given today

## 2022-02-06 ENCOUNTER — Other Ambulatory Visit (HOSPITAL_COMMUNITY): Payer: Self-pay

## 2022-02-07 ENCOUNTER — Encounter: Payer: Self-pay | Admitting: Internal Medicine

## 2022-02-22 ENCOUNTER — Other Ambulatory Visit (HOSPITAL_BASED_OUTPATIENT_CLINIC_OR_DEPARTMENT_OTHER): Payer: Self-pay | Admitting: Cardiology

## 2022-02-23 ENCOUNTER — Telehealth: Payer: Self-pay

## 2022-02-23 ENCOUNTER — Other Ambulatory Visit (HOSPITAL_COMMUNITY): Payer: Self-pay

## 2022-02-23 MED ORDER — WEGOVY 1.7 MG/0.75ML ~~LOC~~ SOAJ
1.7000 mg | SUBCUTANEOUS | 0 refills | Status: DC
  Filled 2022-02-23: qty 3, 28d supply, fill #0

## 2022-02-23 MED ORDER — WEGOVY 2.4 MG/0.75ML ~~LOC~~ SOAJ
2.4000 mg | SUBCUTANEOUS | 5 refills | Status: DC
  Filled 2022-02-23: qty 3, fill #0

## 2022-02-23 NOTE — Telephone Encounter (Signed)
Sent in 1.7mg  refill as well as the 2.4mg  dose for when she titrates up

## 2022-02-23 NOTE — Telephone Encounter (Signed)
Patient Advocate Encounter   Received notification from [employer] that prior authorization is required for Aimovig 140MG /ML auto-injectors  Submitted: 02-23-2022 [facility]  Status is pending

## 2022-02-23 NOTE — Telephone Encounter (Signed)
Please review for refill. Thank you! 

## 2022-02-27 ENCOUNTER — Other Ambulatory Visit (HOSPITAL_COMMUNITY): Payer: Self-pay

## 2022-03-02 NOTE — Telephone Encounter (Signed)
Patient Advocate Encounter  Prior Authorization for Aimovig 140mg /mL Auto-injectors has been approved.     Effective: 02-27-2022 to 02-27-2023

## 2022-03-05 ENCOUNTER — Other Ambulatory Visit (HOSPITAL_COMMUNITY): Payer: Self-pay

## 2022-03-09 ENCOUNTER — Ambulatory Visit (HOSPITAL_BASED_OUTPATIENT_CLINIC_OR_DEPARTMENT_OTHER): Payer: 59 | Admitting: Cardiology

## 2022-03-09 ENCOUNTER — Encounter (HOSPITAL_BASED_OUTPATIENT_CLINIC_OR_DEPARTMENT_OTHER): Payer: Self-pay | Admitting: Cardiology

## 2022-03-09 ENCOUNTER — Other Ambulatory Visit (HOSPITAL_COMMUNITY): Payer: Self-pay

## 2022-03-09 VITALS — BP 112/88 | HR 86 | Ht 63.0 in | Wt 216.9 lb

## 2022-03-09 DIAGNOSIS — R5383 Other fatigue: Secondary | ICD-10-CM | POA: Diagnosis not present

## 2022-03-09 DIAGNOSIS — R7989 Other specified abnormal findings of blood chemistry: Secondary | ICD-10-CM | POA: Diagnosis not present

## 2022-03-09 DIAGNOSIS — Z7182 Exercise counseling: Secondary | ICD-10-CM

## 2022-03-09 DIAGNOSIS — Z7189 Other specified counseling: Secondary | ICD-10-CM

## 2022-03-09 DIAGNOSIS — Z6841 Body Mass Index (BMI) 40.0 and over, adult: Secondary | ICD-10-CM | POA: Diagnosis not present

## 2022-03-09 DIAGNOSIS — Z79899 Other long term (current) drug therapy: Secondary | ICD-10-CM | POA: Diagnosis not present

## 2022-03-09 MED ORDER — WEGOVY 2.4 MG/0.75ML ~~LOC~~ SOAJ
2.4000 mg | SUBCUTANEOUS | 5 refills | Status: DC
  Filled 2022-03-09: qty 3, 28d supply, fill #0
  Filled 2022-04-11: qty 3, 28d supply, fill #1
  Filled 2022-05-14 – 2022-05-17 (×3): qty 3, 28d supply, fill #2
  Filled 2022-06-12: qty 3, 28d supply, fill #3
  Filled 2022-07-12: qty 3, 28d supply, fill #4
  Filled 2022-08-01 – 2022-08-16 (×3): qty 3, 28d supply, fill #5

## 2022-03-09 NOTE — Progress Notes (Signed)
Cardiology Office Note:    Date:  03/09/2022   ID:  Shannon Gould, DOB 1975/12/16, MRN [id number]  PCP:  Sharilyn Sites, MD  Cardiologist:  Buford Dresser, MD PhD  Referring MD: Sharilyn Sites, MD   CC: Follow-up  History of Present Illness:    Shannon Gould is a 46 y.o. female with a hx of depression, anxiety who is being seen for follow up. I initially met her 07/29/2018 as a new consult at the request of Sharilyn Sites, MD for the evaluation and management of chest pain/palpitations.  Family history: father has had 2 Mis, has three stents. First was in his late 40s/early 25s. Mother and father, as well as sister and brother, have high blood pressure. [name] had both MI and stroke.   Today: Weight at home was 213 lbs, down about 30 lbs overall. Doing well with Wegovy, has some acid reflux and constipation, but this is manageable. On the 1.7 mg dose, feels that she has plateaued. Going to the gym for exercise.  Denies shortness of breath at rest or with normal exertion. No PND, orthopnea, LE edema or unexpected weight gain. No syncope or palpitations.   Past Medical History:  Diagnosis Date   Asthma    Bipolar 1 disorder ([place])    Chronic constipation    CIN II (cervical intraepithelial neoplasia II)    GAD (generalized anxiety disorder)    GERD (gastroesophageal reflux disease)    Hiatal hernia    History of iron deficiency anemia    History of palpitations in adulthood    pt evaluated by cardiologist--- dr b. Harrell Gave note in epic 09-06-2021  for atypical chest pain/ palpitations per not pt  had echo approxl 2013 and normal ETT 2020 not further testing and released prn basis   MDD (major depressive disorder)    Migraine without aura and without status migrainosus, not intractable    neurologist--- dr Tomi Likens   Panic disorder    Urgency of urination    Wears glasses     Past Surgical History:  Procedure Laterality Date   CERVICAL CONIZATION W/BX N/A  01/22/2022   Procedure: CONIZATION CERVIX WITH BIOPSY: COLD KNIFE CONE;  Surgeon: Jerelyn Charles, MD;  Location: [facility];  Service: Gynecology;  Laterality: N/A;   COLPOSCOPY  01/22/2022   CYSTOSCOPY WITH URETHRAL DILATATION     age 86   ERCP  2006   '@[facility]'    LAPAROSCOPIC CHOLECYSTECTOMY  2002   '@[facility]'    REMOVAL OF URINARY SLING  02/17/2018   '@[facility]' ;   vaginal urethrolysis , partial removal midurethral sling   URETHRAL SLING  08/08/2010   '@[facility]'  by dr Michela Pitcher;    Tension-free vaginal tape    Current Medications: Current Outpatient Medications on File Prior to Visit  Medication Sig   ALBUTEROL IN Inhale into the lungs as needed.   ARIPiprazole (ABILIFY) 20 MG tablet TAKE 1 TABLET BY MOUTH ONCE DAILY (Patient taking differently: Take 20 mg by mouth daily.)   b complex vitamins tablet Take 1 tablet by mouth daily.   busPIRone (BUSPAR) 15 MG tablet Take 1 tablet (15 mg total) by mouth 2 (two) times daily. (Patient taking differently: Take 15 mg by mouth 2 (two) times daily.)   Cholecalciferol (VITAMIN D) 125 MCG (5000 UT) CAPS Take by mouth.   diclofenac (VOLTAREN) 75 MG EC tablet Take 1 tablet (75 mg total) by mouth 2 (two) times daily.   Dihydroergotamine Mesylate HFA (TRUDHESA) 0.725 MG/ACT AERS Place  1 spray into the nose as directed. Place 1 spray into each nostril.  May repeat once after 1 hour.  Maximum 2 doses in 24 hours. (Patient taking differently: Place 1 spray into the nose as directed. Place 1 spray into each nostril.  May repeat once after 1 hour.  Maximum 2 doses in 24 hours.)   Erenumab-aooe (AIMOVIG) 140 MG/ML SOAJ Inject 140 mg (1 pen) as directed every 28 days. (Patient taking differently: Inject 140 mg as directed every 28 (twenty-eight) days.)   furosemide (LASIX) 40 MG tablet Take 40 mg by mouth daily as needed for fluid or edema.   levonorgestrel (MIRENA, 52 MG,) 20 MCG/24HR IUD 1 each by Intrauterine route once.    LORazepam (ATIVAN) 1 MG tablet Take 1-2  tablets by mouth every 8 (eight) hours as needed for anxiety.   Omega-3 Fatty Acids (FISH OIL PO) Take 1 capsule by mouth daily.   pantoprazole (PROTONIX) 20 MG tablet Take 1 tablet (20 mg total) by mouth daily before breakfast. (Patient taking differently: Take 20 mg by mouth daily before breakfast.)   polyethylene glycol (MIRALAX) 17 g packet Take 17 g by mouth daily. (Patient taking differently: Take 17 g by mouth at bedtime.)   Semaglutide-Weight Management (WEGOVY) 1.7 MG/0.75ML SOAJ Inject 1.7 mg into the skin once a week.   Semaglutide-Weight Management (WEGOVY) 2.4 MG/0.75ML SOAJ Inject 2.4 mg into the skin once a week.   venlafaxine XR (EFFEXOR-XR) 150 MG 24 hr capsule TAKE 1 CAPSULE BY MOUTH DAILY WITH BREAKFAST (Patient taking differently: Take 150 mg by mouth daily with breakfast.)   venlafaxine XR (EFFEXOR-XR) 75 MG 24 hr capsule TAKE 1 CAPSULE BY MOUTH DAILY WITH BREAKFAST. TAKE WITH 150 MG TO EQUAL A DOSE OF  225 MG. (Patient taking differently: Take 75 mg by mouth daily with breakfast. Pt takes both 150 mg and 75 mg in am)   Vitamin D, Ergocalciferol, (DRISDOL) 1.25 MG (50000 UNIT) CAPS capsule TAKE 1 CAPSULE BY MOUTH WEEKLY (Patient taking differently: 50,000 Units every 7 (seven) days. TAKE 1 CAPSULE BY MOUTH WEEKLY--- Wednesday's)   Wheat Dextrin (BENEFIBER) POWD Use daily as directed (Patient taking differently: Take by mouth at bedtime. Use daily as directed)   zolpidem (AMBIEN CR) 12.5 MG CR tablet Take 1 tablet (12.5 mg total) by mouth at bedtime as needed for sleep.   No current facility-administered medications on file prior to visit.     Allergies:   Benzodiazepines, Hydrocodone, Lexapro [escitalopram], Wellbutrin [bupropion], Caffeine, and Lactose intolerance (gi)   Social History   Tobacco Use   Smoking status: Never   Smokeless tobacco: Never  Vaping Use   Vaping Use: Never used  Substance Use Topics   Alcohol use: Not Currently    Comment: rare   Drug use:  Never      Family History: The patient's family history includes Anemia in her sister; Anxiety disorder in her daughter; Asthma in her daughter, maternal grandmother, son, and son; CAD in her father; Depression in her daughter; Diabetes in her father, mother, paternal grandfather, and paternal grandmother; Heart attack in her father; Heart disease in her brother, father, and sister; Hyperlipidemia in her father; Hypertension in her brother, father, mother, paternal grandmother, and sister; Seizures in her paternal grandmother; Sleep apnea in her father.  ROS:   Please see the history of present illness. All other systems are reviewed and negative.    EKGs/Labs/Other Studies Reviewed:    The following studies were reviewed today: Notes from  PCP  ETT 08/07/2018: Blood pressure demonstrated a normal response to exercise. There was no ST segment deviation noted during stress.   ETT with normal exercise tolerance (6:29); No CP; normal BP response; no ST changes; negative adequate ETT; Duke treadmill score 6.  EKG:  EKG is personally reviewed.   12/08/2021: EKG was not ordered. 09/06/2021: NSR at 82 bpm 07/29/2018: normal sinus rhythm  Recent Labs: 12/08/2021: ALT 10; BUN 6; Creatinine, Ser 0.74; Potassium 4.1; Sodium 139 01/22/2022: Hemoglobin 14.1; Platelets 248  Recent Lipid Panel    Component Value Date/Time   CHOL 207 (H) 12/08/2021 1309   TRIG 64 12/08/2021 1309   HDL 60 12/08/2021 1309   CHOLHDL 3.5 12/08/2021 1309   LDLCALC 136 (H) 12/08/2021 1309     Physical Exam:    VS:  BP 112/88 (BP Location: Left Arm, Patient Position: Sitting, Cuff Size: Large)   Pulse 86   Ht '5\' 3"'  (1.6 m)   Wt 216 lb 14.4 oz (98.4 kg)   SpO2 93%   BMI 38.42 kg/m     Wt Readings from Last 3 Encounters:  03/09/22 216 lb 14.4 oz (98.4 kg)  01/29/22 230 lb (104.3 kg)  01/22/22 225 lb (102.1 kg)    GEN: Well nourished, well developed in no acute distress HEENT: Normal, moist mucous  membranes NECK: No JVD CARDIAC: regular rhythm, normal S1 and S2, no rubs or gallops. No murmur. VASCULAR: Radial and DP pulses 2+ bilaterally. No carotid bruits RESPIRATORY:  Clear to auscultation without rales, wheezing or rhonchi  ABDOMEN: Soft, non-tender, non-distended MUSCULOSKELETAL:  Ambulates independently SKIN: Warm and dry, no edema NEUROLOGIC:  Alert and oriented x 3. No focal neuro deficits noted. PSYCHIATRIC:  Normal affect    ASSESSMENT:    1. Cardiac risk counseling   2. Class 3 severe obesity due to excess calories without serious comorbidity with body mass index (BMI) of 40.0 to 44.9 in adult ([place])   3. Exercise counseling   4. Counseling on health promotion and disease prevention     PLAN:    Chest discomfort: improved -improved -prior ETT reassuring -reviewed red flag warning signs that need immediate medical attention  Obesity, class 3, BMI 43->38 At risk for metabolic syndrome: aripiprazole increases her risk for metabolic syndrome. -doing well on semaglutide, has already lost weight on lower doses -continue uptitration to 2.4 mg weekly today -reviewed diet, exercise recommendations at length  Intermittent LE edema -hasn't needed lasix in months  -recommend heart healthy/Mediterranean diet, with whole grains, fruits, vegetable, fish, lean meats, nuts, and olive oil. Limit salt. -recommend moderate walking, 3-5 times/week for 30-50 minutes each session. Aim for at least 150 minutes.week. Goal should be pace of 3 miles/hours, or walking 1.5 miles in 30 minutes -recommend avoidance of tobacco products. Avoid excess alcohol. -ASCVD risk score: The 10-year ASCVD risk score (Arnett DK, et al., 2019) is: 0.4%   Values used to calculate the score:     Age: 70 years     Sex: Female     Is Non-Hispanic African American: Yes     Diabetic: No     Tobacco smoker: No     Systolic Blood Pressure: 295 mmHg     Is BP treated: No     HDL Cholesterol: 60 mg/dL      Total Cholesterol: 207 mg/dL    Plan for follow up: 6 months  Medication Adjustments/Labs and Tests Ordered: Current medicines are reviewed at length with the patient today.  Concerns  regarding medicines are outlined above.   No orders of the defined types were placed in this encounter.  Meds ordered this encounter  Medications   Semaglutide-Weight Management (WEGOVY) 2.4 MG/0.75ML SOAJ    Sig: Inject 2.4 mg into the skin once a week.    Dispense:  3 mL    Refill:  5    Patient Instructions  Medication Instructions:  Your Physician recommend you continue on your current medication as directed.    *If you need a refill on your cardiac medications before your next appointment, please call your pharmacy*   Lab Work: None ordered today   Testing/Procedures: None ordered today   Follow-Up: At [facility], you and your health needs are our priority.  As part of our continuing mission to provide you with exceptional heart care, we have created designated Provider Care Teams.  These Care Teams include your primary Cardiologist (physician) and Advanced Practice Providers (APPs -  Physician Assistants and Nurse Practitioners) who all work together to provide you with the care you need, when you need it.  We recommend signing up for the patient portal called "MyChart".  Sign up information is provided on this After Visit Summary.  MyChart is used to connect with patients for Virtual Visits (Telemedicine).  Patients are able to view lab/test results, encounter notes, upcoming appointments, etc.  Non-urgent messages can be sent to your provider as well.   To learn more about what you can do with MyChart, go to [email or web address].    Your next appointment:   6 month(s)  The format for your next appointment:   In Person  Provider:   Buford Dresser, MD             Signed, Buford Dresser, MD PhD 03/09/2022     [place]

## 2022-03-09 NOTE — Patient Instructions (Signed)

## 2022-03-10 LAB — VITAMIN D 25 HYDROXY (VIT D DEFICIENCY, FRACTURES): Vit D, 25-Hydroxy: 62.6 ng/mL (ref 30.0–100.0)

## 2022-03-10 LAB — TSH: TSH: 0.737 u[IU]/mL (ref 0.450–4.500)

## 2022-03-10 NOTE — Progress Notes (Signed)
Please let her know Vitamin D level is good. Recommend staying on weekly 50,000 iu dose. Will repeat in 3 months.  TSH was nl.

## 2022-03-19 ENCOUNTER — Other Ambulatory Visit (HOSPITAL_COMMUNITY): Payer: Self-pay

## 2022-03-19 ENCOUNTER — Other Ambulatory Visit: Payer: Self-pay

## 2022-03-19 MED ORDER — VITAMIN D (ERGOCALCIFEROL) 1.25 MG (50000 UNIT) PO CAPS
50000.0000 [IU] | ORAL_CAPSULE | ORAL | 1 refills | Status: DC
  Filled 2022-03-19: qty 12, 84d supply, fill #0

## 2022-03-29 ENCOUNTER — Other Ambulatory Visit (HOSPITAL_COMMUNITY): Payer: Self-pay

## 2022-04-04 ENCOUNTER — Other Ambulatory Visit: Payer: Self-pay | Admitting: Physician Assistant

## 2022-04-04 ENCOUNTER — Other Ambulatory Visit (HOSPITAL_COMMUNITY): Payer: Self-pay

## 2022-04-04 MED ORDER — PANTOPRAZOLE SODIUM 20 MG PO TBEC
20.0000 mg | DELAYED_RELEASE_TABLET | Freq: Every day | ORAL | 0 refills | Status: AC
  Filled 2022-04-04: qty 90, 90d supply, fill #0

## 2022-04-11 ENCOUNTER — Other Ambulatory Visit: Payer: Self-pay | Admitting: Physician Assistant

## 2022-04-11 ENCOUNTER — Other Ambulatory Visit: Payer: Self-pay | Admitting: Neurology

## 2022-04-12 ENCOUNTER — Other Ambulatory Visit (HOSPITAL_COMMUNITY): Payer: Self-pay

## 2022-04-12 MED ORDER — AIMOVIG 140 MG/ML ~~LOC~~ SOAJ
140.0000 mg | SUBCUTANEOUS | 5 refills | Status: DC
  Filled 2022-04-12: qty 1, 28d supply, fill #0
  Filled 2022-05-14: qty 1, 28d supply, fill #1
  Filled 2022-07-06 (×2): qty 1, 28d supply, fill #2
  Filled 2022-07-30: qty 1, 28d supply, fill #3

## 2022-04-12 MED ORDER — BUSPIRONE HCL 15 MG PO TABS
15.0000 mg | ORAL_TABLET | Freq: Two times a day (BID) | ORAL | 1 refills | Status: DC
  Filled 2022-04-12: qty 180, 90d supply, fill #0
  Filled 2022-07-04: qty 180, 90d supply, fill #1

## 2022-04-13 ENCOUNTER — Other Ambulatory Visit (HOSPITAL_COMMUNITY): Payer: Self-pay

## 2022-05-01 ENCOUNTER — Other Ambulatory Visit (HOSPITAL_COMMUNITY): Payer: Self-pay

## 2022-05-01 ENCOUNTER — Telehealth: Payer: 59 | Admitting: Physician Assistant

## 2022-05-01 DIAGNOSIS — R3989 Other symptoms and signs involving the genitourinary system: Secondary | ICD-10-CM | POA: Diagnosis not present

## 2022-05-01 MED ORDER — CEPHALEXIN 500 MG PO CAPS
500.0000 mg | ORAL_CAPSULE | Freq: Two times a day (BID) | ORAL | 0 refills | Status: AC
  Filled 2022-05-01: qty 14, 7d supply, fill #0

## 2022-05-01 NOTE — Progress Notes (Signed)

## 2022-05-01 NOTE — Progress Notes (Signed)
I have spent 5 minutes in review of e-visit questionnaire, review and updating patient chart, medical decision making and response to patient.   Neila Teem Cody Jhalen Eley, PA-C    

## 2022-05-08 DIAGNOSIS — H5213 Myopia, bilateral: Secondary | ICD-10-CM | POA: Diagnosis not present

## 2022-05-15 ENCOUNTER — Other Ambulatory Visit (HOSPITAL_COMMUNITY): Payer: Self-pay

## 2022-05-15 ENCOUNTER — Telehealth (HOSPITAL_BASED_OUTPATIENT_CLINIC_OR_DEPARTMENT_OTHER): Payer: Self-pay

## 2022-05-15 NOTE — Telephone Encounter (Signed)
Received prior auth request for wegovy 2.4mg , submitted via cover my meds, awaiting determination.

## 2022-05-16 NOTE — Telephone Encounter (Signed)
Per cover my meds,   "[employer] is reviewing your PA request. You may close this dialog, return to your dashboard, and perform other tasks.  To check for an update later, open this request again from your dashboard. If [employer] has not replied within 24 hours for urgent requests or within 48 hours for standard requests, please contact [employer] at [phone]."

## 2022-05-17 ENCOUNTER — Other Ambulatory Visit (HOSPITAL_COMMUNITY): Payer: Self-pay

## 2022-05-18 ENCOUNTER — Other Ambulatory Visit: Payer: Self-pay

## 2022-05-18 NOTE — Telephone Encounter (Signed)
Per cover my meds,   "The request has been approved. The authorization is effective from 05/17/2022 to 05/17/2023, as long as the member is enrolled in their current health plan. The request was reviewed and approved by a licensed clinical pharmacist. This request has been approved for 59mL per 28 days.A second prior authorization (21119) has been entered for [facility] 0.25mg /0.30mL, this request is effective from 05/17/2022 and is valid until 05/17/2023 and is limited to 29mL per 28 days.A third prior authorization (21120) has been entered for [facility] 0.5mg /0.42mL, this request is effective from 05/17/2022 and is valid until 05/17/2023 and is limited to 38mL per 28 days.A fourth prior authorization (21121) has been entered for [facility] 1mg /0.5mL, this request is effective from 05/17/2022 and is valid until 05/17/2023 and is limited to 49mL per 28 days.A fifth prior authorization (21122) has been entered for [facility] 1.7mg /0.57mL, this request is effective from 05/17/2022 and is valid until 05/17/2023 and is limited to 3mL per 28 days. A written notification letter will follow with additional details."

## 2022-05-24 ENCOUNTER — Other Ambulatory Visit: Payer: Self-pay

## 2022-05-25 ENCOUNTER — Other Ambulatory Visit (HOSPITAL_COMMUNITY): Payer: Self-pay

## 2022-05-29 ENCOUNTER — Other Ambulatory Visit: Payer: Self-pay

## 2022-05-29 ENCOUNTER — Other Ambulatory Visit (HOSPITAL_COMMUNITY): Payer: Self-pay

## 2022-06-07 ENCOUNTER — Telehealth: Payer: Commercial Managed Care - PPO | Admitting: Emergency Medicine

## 2022-06-07 ENCOUNTER — Other Ambulatory Visit (HOSPITAL_COMMUNITY): Payer: Self-pay

## 2022-06-07 DIAGNOSIS — J069 Acute upper respiratory infection, unspecified: Secondary | ICD-10-CM

## 2022-06-07 MED ORDER — IPRATROPIUM BROMIDE 0.03 % NA SOLN
2.0000 | Freq: Two times a day (BID) | NASAL | 0 refills | Status: AC
  Filled 2022-06-07: qty 30, 43d supply, fill #0

## 2022-06-07 MED ORDER — BENZONATATE 100 MG PO CAPS
100.0000 mg | ORAL_CAPSULE | Freq: Two times a day (BID) | ORAL | 0 refills | Status: DC | PRN
  Filled 2022-06-07: qty 20, 10d supply, fill #0

## 2022-06-07 NOTE — Progress Notes (Signed)
E-Visit for Upper Respiratory Infection   We are sorry you are not feeling well.  Here is how we plan to help!  Based on what you have shared with me, it looks like you may have a viral upper respiratory infection.  Upper respiratory infections are caused by a large number of viruses; however, rhinovirus is the most common cause.   Symptoms vary from person to person, with common symptoms including sore throat, cough, fatigue or lack of energy and feeling of general discomfort.  A low-grade fever of up to 100.4 may present, but is often uncommon.  Symptoms vary however, and are closely related to a person's age or underlying illnesses.  The most common symptoms associated with an upper respiratory infection are nasal discharge or congestion, cough, sneezing, headache and pressure in the ears and face.  These symptoms usually persist for about 3 to 10 days, but can last up to 2 weeks.  It is important to know that upper respiratory infections do not cause serious illness or complications in most cases.    Upper respiratory infections can be transmitted from person to person, with the most common method of transmission being a person's hands.  The virus is able to live on the skin and can infect other persons for up to 2 hours after direct contact.  Also, these can be transmitted when someone coughs or sneezes; thus, it is important to cover the mouth to reduce this risk.  To keep the spread of the illness at [place], good hand hygiene is very important.  This is an infection that is most likely caused by a virus. There are no specific treatments other than to help you with the symptoms until the infection runs its course.  We are sorry you are not feeling well.  Here is how we plan to help!   For nasal congestion, you may use an oral decongestants such as Mucinex D or if you have glaucoma or high blood pressure use plain Mucinex.  Saline nasal spray or nasal drops can help and can safely be used as often as  needed for congestion.  For your congestion, I have prescribed Ipratropium Bromide nasal spray 0.03% two sprays in each nostril 2-3 times a day  If you do not have a history of heart disease, hypertension, diabetes or thyroid disease, prostate/bladder issues or glaucoma, you may also use Sudafed to treat nasal congestion.  It is highly recommended that you consult with a pharmacist or your primary care physician to ensure this medication is safe for you to take.     If you have a cough, you may use cough suppressants such as Delsym and Robitussin.  If you have glaucoma or high blood pressure, you can also use Coricidin HBP.   For cough I have prescribed for you A prescription cough medication called Tessalon Perles 100 mg. You may take 1-2 capsules every 8 hours as needed for cough  If you have a sore or scratchy throat, use a saltwater gargle-  to  teaspoon of salt dissolved in a 4-ounce to 8-ounce glass of warm water.  Gargle the solution for approximately 15-30 seconds and then spit.  It is important not to swallow the solution.  You can also use throat lozenges/cough drops and Chloraseptic spray to help with throat pain or discomfort.  Warm or cold liquids can also be helpful in relieving throat pain.  For headache, pain or general discomfort, you can use Ibuprofen or Tylenol as directed.  Some authorities believe that zinc sprays or the use of Echinacea may shorten the course of your symptoms.   HOME CARE Only take medications as instructed by your medical team. Be sure to drink plenty of fluids. Water is fine as well as fruit juices, sodas and electrolyte beverages. You may want to stay away from caffeine or alcohol. If you are nauseated, try taking small sips of liquids. How do you know if you are getting enough fluid? Your urine should be a pale yellow or almost colorless. Get rest. Taking a steamy shower or using a humidifier may help nasal congestion and ease sore throat pain. You can  place a towel over your head and breathe in the steam from hot water coming from a faucet. Using a saline nasal spray works much the same way. Cough drops, hard candies and sore throat lozenges may ease your cough. Avoid close contacts especially the very young and the elderly Cover your mouth if you cough or sneeze Always remember to wash your hands.   GET HELP RIGHT AWAY IF: You develop worsening fever. If your symptoms do not improve within 10 days You develop yellow or green discharge from your nose over 3 days. You have coughing fits You develop a severe head ache or visual changes. You develop shortness of breath, difficulty breathing or start having chest pain Your symptoms persist after you have completed your treatment plan  MAKE SURE YOU  Understand these instructions. Will watch your condition. Will get help right away if you are not doing well or get worse.  Thank you for choosing an e-visit.  Your e-visit answers were reviewed by a board certified advanced clinical practitioner to complete your personal care plan. Depending upon the condition, your plan could have included both over the counter or prescription medications.  Please review your pharmacy choice. Make sure the pharmacy is open so you can pick up prescription now. If there is a problem, you may contact your provider through [employer] and have the prescription routed to another pharmacy.  Your safety is important to [place]. If you have drug allergies check your prescription carefully.   For the next 24 hours you can use MyChart to ask questions about today's visit, request a non-urgent call back, or ask for a work or school excuse. You will get an email in the next two days asking about your experience. I hope that your e-visit has been valuable and will speed your recovery.    Approximately 5 minutes was used in reviewing the patient's chart, questionnaire, prescribing medications, and documentation.

## 2022-06-11 ENCOUNTER — Encounter: Payer: Self-pay | Admitting: Physician Assistant

## 2022-06-11 ENCOUNTER — Telehealth: Payer: Commercial Managed Care - PPO | Admitting: Physician Assistant

## 2022-06-11 ENCOUNTER — Other Ambulatory Visit (HOSPITAL_COMMUNITY): Payer: Self-pay

## 2022-06-11 DIAGNOSIS — F319 Bipolar disorder, unspecified: Secondary | ICD-10-CM

## 2022-06-11 DIAGNOSIS — F411 Generalized anxiety disorder: Secondary | ICD-10-CM | POA: Diagnosis not present

## 2022-06-11 DIAGNOSIS — G4700 Insomnia, unspecified: Secondary | ICD-10-CM | POA: Diagnosis not present

## 2022-06-11 DIAGNOSIS — R7989 Other specified abnormal findings of blood chemistry: Secondary | ICD-10-CM | POA: Diagnosis not present

## 2022-06-11 MED ORDER — ARIPIPRAZOLE 20 MG PO TABS
20.0000 mg | ORAL_TABLET | Freq: Every day | ORAL | 1 refills | Status: DC
  Filled 2022-06-11 – 2022-07-04 (×2): qty 90, 90d supply, fill #0
  Filled 2022-10-10: qty 90, 90d supply, fill #1

## 2022-06-11 MED ORDER — VENLAFAXINE HCL ER 150 MG PO CP24
150.0000 mg | ORAL_CAPSULE | Freq: Every day | ORAL | 1 refills | Status: DC
  Filled 2022-06-11: qty 90, fill #0
  Filled 2022-07-04: qty 90, 90d supply, fill #0
  Filled 2022-10-10: qty 90, 90d supply, fill #1

## 2022-06-11 MED ORDER — VENLAFAXINE HCL ER 75 MG PO CP24
75.0000 mg | ORAL_CAPSULE | Freq: Every day | ORAL | 1 refills | Status: DC
  Filled 2022-06-11: qty 90, fill #0
  Filled 2022-07-04: qty 90, 90d supply, fill #0
  Filled 2022-10-10: qty 90, 90d supply, fill #1

## 2022-06-11 MED ORDER — LORAZEPAM 1 MG PO TABS
1.0000 mg | ORAL_TABLET | Freq: Three times a day (TID) | ORAL | 5 refills | Status: DC | PRN
  Filled 2022-06-11 – 2022-11-15 (×3): qty 60, 10d supply, fill #0

## 2022-06-11 NOTE — Progress Notes (Signed)
[facility] Check  Patient ID: Shannon Gould,  MRN: [id number]  PCP: Sharilyn Sites, MD  Date of Evaluation: 06/11/2022 Time spent:30 minutes  Chief Complaint:  Chief Complaint   Anxiety; Depression    Virtual Visit via Telehealth  I connected with patient by a video enabled telemedicine application  with their informed consent, and verified patient privacy and that I am speaking with the correct person using two identifiers.  I am private, in my office and the patient is at work.  I discussed the limitations, risks, security and privacy concerns of performing an evaluation and management service by telephone video and the availability of in person appointments. I also discussed with the patient that there may be a patient responsible charge related to this service. The patient expressed understanding and agreed to proceed.   I discussed the assessment and treatment plan with the patient. The patient was provided an opportunity to ask questions and all were answered. The patient agreed with the plan and demonstrated an understanding of the instructions.   The patient was advised to call back or seek an in-person evaluation if the symptoms worsen or if the condition fails to improve as anticipated.  I provided 20  minutes of non-face-to-face time during this encounter.  HISTORY/CURRENT STATUS: HPI for routine med check.  Doing well. Patient is able to enjoy things.  Energy and motivation are good.  Work is going well.   No extreme sadness, tearfulness, or feelings of hopelessness.  Sleeps well most of the time but has been Ativan more to help her relax and go to sleep. Someitmes has racing thoughts which keeps her awake. Ativan works well. ADLs and personal hygiene are normal.   Denies any changes in concentration, making decisions, or remembering things.  Appetite has not changed.  Weight is stable.    Denies suicidal or homicidal thoughts.  Patient denies increased energy with  decreased need for sleep, increased talkativeness, racing thoughts, impulsivity or risky behaviors, increased spending, increased libido, grandiosity, increased irritability or anger, paranoia, or hallucinations.  Denies dizziness, syncope, seizures, numbness, tingling, tremor, tics, unsteady gait, slurred speech, confusion. Denies muscle or joint pain, stiffness, or dystonia.  Individual Medical History/ Review of Systems: Changes? :Yes    had cone bx d/t abnl pap smear. Neg bx results.   Past medications for mental health diagnoses include: Effexor, Vistaril, Xanax, Ambien, Zoloft, Wellbutrin which caused increased agitation and suicidal ideations, trazodone, Abilify, Sonata, Valium ( not sure why it was stopped) mirtazapine was not very effective  Allergies: Benzodiazepines, Hydrocodone, Lexapro [escitalopram], Wellbutrin [bupropion], Caffeine, and Lactose intolerance (gi)  Current Medications:  Current Outpatient Medications:    ALBUTEROL IN, Inhale into the lungs as needed., Disp: , Rfl:    b complex vitamins tablet, Take 1 tablet by mouth daily., Disp: 30 tablet, Rfl: 11   busPIRone (BUSPAR) 15 MG tablet, Take 1 tablet (15 mg total) by mouth 2 (two) times daily., Disp: 180 tablet, Rfl: 1   Cholecalciferol (VITAMIN D) 125 MCG (5000 UT) CAPS, Take by mouth., Disp: , Rfl:    diclofenac (VOLTAREN) 75 MG EC tablet, Take 1 tablet (75 mg total) by mouth 2 (two) times daily., Disp: 50 tablet, Rfl: 2   Dihydroergotamine Mesylate HFA (TRUDHESA) 0.725 MG/ACT AERS, Place 1 spray into the nose as directed. Place 1 spray into each nostril.  May repeat once after 1 hour.  Maximum 2 doses in 24 hours. (Patient taking differently: Place 1 spray into the nose as directed.  Place 1 spray into each nostril.  May repeat once after 1 hour.  Maximum 2 doses in 24 hours.), Disp: 8 mL, Rfl: 5   Erenumab-aooe (AIMOVIG) 140 MG/ML SOAJ, Inject 140 mg (1 pen) as directed every 28 days., Disp: 1 mL, Rfl: 5   furosemide  (LASIX) 40 MG tablet, Take 40 mg by mouth daily as needed for fluid or edema., Disp: , Rfl:    ipratropium (ATROVENT) 0.03 % nasal spray, Place 2 sprays into both nostrils every 12 (twelve) hours., Disp: 30 mL, Rfl: 0   levonorgestrel (MIRENA, 52 MG,) 20 MCG/24HR IUD, 1 each by Intrauterine route once. , Disp: , Rfl:    Omega-3 Fatty Acids (FISH OIL PO), Take 1 capsule by mouth daily., Disp: , Rfl:    pantoprazole (PROTONIX) 20 MG tablet, Take 1 tablet (20 mg total) by mouth daily before breakfast., Disp: 90 tablet, Rfl: 0   polyethylene glycol (MIRALAX) 17 g packet, Take 17 g by mouth daily. (Patient taking differently: Take 17 g by mouth at bedtime.), Disp: 30 each, Rfl: 3   Semaglutide-Weight Management (WEGOVY) 2.4 MG/0.75ML SOAJ, Inject 2.4 mg into the skin once a week., Disp: 3 mL, Rfl: 5   Wheat Dextrin (BENEFIBER) POWD, Use daily as directed (Patient taking differently: Take by mouth at bedtime. Use daily as directed), Disp: 529 g, Rfl: 0   zolpidem (AMBIEN CR) 12.5 MG CR tablet, Take 1 tablet (12.5 mg total) by mouth at bedtime as needed for sleep., Disp: 30 tablet, Rfl: 5   ARIPiprazole (ABILIFY) 20 MG tablet, TAKE 1 TABLET BY MOUTH ONCE DAILY, Disp: 90 tablet, Rfl: 1   benzonatate (TESSALON) 100 MG capsule, Take 1 capsule (100 mg total) by mouth 2 (two) times daily as needed for cough. (Patient not taking: Reported on 06/11/2022), Disp: 20 capsule, Rfl: 0   LORazepam (ATIVAN) 1 MG tablet, Take 1-2 tablets by mouth every 8 (eight) hours as needed for anxiety., Disp: 60 tablet, Rfl: 5   venlafaxine XR (EFFEXOR-XR) 150 MG 24 hr capsule, TAKE 1 CAPSULE BY MOUTH DAILY WITH BREAKFAST, Disp: 90 capsule, Rfl: 1   venlafaxine XR (EFFEXOR-XR) 75 MG 24 hr capsule, TAKE 1 CAPSULE BY MOUTH DAILY WITH BREAKFAST. TAKE WITH 150 MG TO EQUAL A DOSE OF  225 MG., Disp: 90 capsule, Rfl: 1   Vitamin D, Ergocalciferol, (DRISDOL) 1.25 MG (50000 UNIT) CAPS capsule, Take 1 capsule (50,000 Units total) by mouth every  Wednesday (Patient not taking: Reported on 06/11/2022), Disp: 12 capsule, Rfl: 1 Medication Side Effects: none  Family Medical/ Social History: Changes?  No  MENTAL HEALTH EXAM:  There were no vitals taken for this visit.There is no height or weight on file to calculate BMI.  General Appearance: Casual  Eye Contact:  Good  Speech:  Clear and Coherent and Normal Rate  Volume:  Normal  Mood:  Euthymic  Affect:  Congruent  Thought Process:  Goal Directed and Descriptions of Associations: Circumstantial  Orientation:  Full (Time, Place, and Person)  Thought Content: Logical   Suicidal Thoughts:  No  Homicidal Thoughts:  No  Memory:  WNL  Judgement:  Good  Insight:  Good  Psychomotor Activity:  Normal  Concentration:  Concentration: Good and Attention Span: Good  Recall:  Good  Fund of Knowledge: Good  Language: Good  Assets:  Desire for Improvement Financial Resources/Insurance Housing Transportation Vocational/Educational  ADL's:  Intact  Cognition: WNL  Prognosis:  Good   Labs  12/08/2021 Reviewed, on chart  DIAGNOSES:    ICD-10-CM   1. Bipolar I disorder (HCC)  F31.9     2. GAD (generalized anxiety disorder)  F41.1     3. Insomnia, unspecified type  G47.00     4. Low vitamin D level  R79.89       Receiving Psychotherapy: No   RECOMMENDATIONS:  PDMP was reviewed. Last Ambien filled 05/18/2022.  Last Ativan filled 05/29/2022.  I provided 30 minutes of non-face-to-face time during this encounter, including time spent before and after the visit in records review, medical decision making, counseling pertinent to today's visit, and charting.   She's doing well so no change needed.   Continue Abilify 20 mg, 1 p.o. every morning. Continue BuSpar 15 mg, 1 tablet twice daily for anxiety. Continue Ativan 1 mg, 1-2 p.o. 3 times daily as needed anxiety. Continue Effexor XR 150 mg +75 mg= 225 mg p.o. daily. Change Ambien to CR 12.5 mg, 1 p.o. nightly as needed. Return  in 6 months.  Melony Overly, PA-C

## 2022-06-12 ENCOUNTER — Other Ambulatory Visit (HOSPITAL_COMMUNITY): Payer: Self-pay

## 2022-06-13 ENCOUNTER — Other Ambulatory Visit: Payer: Self-pay

## 2022-06-24 ENCOUNTER — Encounter (HOSPITAL_BASED_OUTPATIENT_CLINIC_OR_DEPARTMENT_OTHER): Payer: Self-pay | Admitting: Cardiology

## 2022-06-25 ENCOUNTER — Other Ambulatory Visit (HOSPITAL_COMMUNITY): Payer: Self-pay

## 2022-06-25 ENCOUNTER — Other Ambulatory Visit: Payer: Self-pay

## 2022-06-28 ENCOUNTER — Telehealth: Payer: Self-pay | Admitting: Urgent Care

## 2022-06-28 ENCOUNTER — Other Ambulatory Visit: Payer: Self-pay

## 2022-06-28 DIAGNOSIS — N3000 Acute cystitis without hematuria: Secondary | ICD-10-CM

## 2022-06-28 MED ORDER — NITROFURANTOIN MONOHYD MACRO 100 MG PO CAPS
100.0000 mg | ORAL_CAPSULE | Freq: Two times a day (BID) | ORAL | 0 refills | Status: AC
Start: 2022-06-28 — End: 2022-07-03
  Filled 2022-06-28: qty 10, 5d supply, fill #0

## 2022-06-28 NOTE — Progress Notes (Signed)
E-Visit for Urinary Problems  We are sorry that you are not feeling well.  Here is how we plan to help!  Based on what you shared with me it looks like you most likely have a simple urinary tract infection.  A UTI (Urinary Tract Infection) is a bacterial infection of the bladder.  Most cases of urinary tract infections are simple to treat but a key part of your care is to encourage you to drink plenty of fluids and watch your symptoms carefully.  I have prescribed MacroBid 100 mg twice a day for 5 days.  Your symptoms should gradually improve. Call us if the burning in your urine worsens, you develop worsening fever, back pain or pelvic pain or if your symptoms do not resolve after completing the antibiotic.  Urinary tract infections can be prevented by drinking plenty of water to keep your body hydrated.  Also be sure when you wipe, wipe from front to back and don't hold it in!  If possible, empty your bladder every 4 hours.  HOME CARE Drink plenty of fluids Compete the full course of the antibiotics even if the symptoms resolve Remember, when you need to go.go. Holding in your urine can increase the likelihood of getting a UTI! GET HELP RIGHT AWAY IF: You cannot urinate You get a high fever Worsening back pain occurs You see blood in your urine You feel sick to your stomach or throw up You feel like you are going to pass out  MAKE SURE YOU  Understand these instructions. Will watch your condition. Will get help right away if you are not doing well or get worse.   Thank you for choosing an e-visit.  Your e-visit answers were reviewed by a board certified advanced clinical practitioner to complete your personal care plan. Depending upon the condition, your plan could have included both over the counter or prescription medications.  Please review your pharmacy choice. Make sure the pharmacy is open so you can pick up prescription now. If there is a problem, you may contact your  provider through MyChart messaging and have the prescription routed to another pharmacy.  Your safety is important to us. If you have drug allergies check your prescription carefully.   For the next 24 hours you can use MyChart to ask questions about today's visit, request a non-urgent call back, or ask for a work or school excuse. You will get an email in the next two days asking about your experience. I hope that your e-visit has been valuable and will speed your recovery.   I have spent 5 minutes in review of e-visit questionnaire, review and updating patient chart, medical decision making and response to patient.   Om Lizotte L Anaysia Germer, PA    

## 2022-07-02 ENCOUNTER — Telehealth (HOSPITAL_BASED_OUTPATIENT_CLINIC_OR_DEPARTMENT_OTHER): Payer: Self-pay | Admitting: Cardiology

## 2022-07-02 NOTE — Telephone Encounter (Signed)
Spoke with patient to rescheduled the 10/02/22 appointment with Dr. Harrell Gave (out of the office)---rescheduled to Friday 11/01/22 at 9:00 am. Will mail information to patient and she voiced her understanding.

## 2022-07-04 ENCOUNTER — Other Ambulatory Visit: Payer: Self-pay

## 2022-07-06 ENCOUNTER — Other Ambulatory Visit (HOSPITAL_COMMUNITY): Payer: Self-pay

## 2022-07-06 ENCOUNTER — Other Ambulatory Visit: Payer: Self-pay

## 2022-07-06 DIAGNOSIS — F329 Major depressive disorder, single episode, unspecified: Secondary | ICD-10-CM | POA: Diagnosis not present

## 2022-07-06 DIAGNOSIS — E559 Vitamin D deficiency, unspecified: Secondary | ICD-10-CM | POA: Diagnosis not present

## 2022-07-06 DIAGNOSIS — N029 Recurrent and persistent hematuria with unspecified morphologic changes: Secondary | ICD-10-CM | POA: Diagnosis not present

## 2022-07-06 MED ORDER — LINZESS 145 MCG PO CAPS
145.0000 ug | ORAL_CAPSULE | Freq: Every day | ORAL | 1 refills | Status: DC
  Filled 2022-08-01: qty 90, 90d supply, fill #0
  Filled 2023-02-26: qty 90, 90d supply, fill #1

## 2022-07-06 MED ORDER — LINZESS 72 MCG PO CAPS
72.0000 ug | ORAL_CAPSULE | Freq: Every day | ORAL | 1 refills | Status: DC
  Filled 2022-07-06: qty 30, 30d supply, fill #0

## 2022-07-09 ENCOUNTER — Other Ambulatory Visit (HOSPITAL_COMMUNITY): Payer: Self-pay

## 2022-07-13 ENCOUNTER — Other Ambulatory Visit (HOSPITAL_COMMUNITY): Payer: Self-pay

## 2022-07-17 ENCOUNTER — Other Ambulatory Visit (HOSPITAL_COMMUNITY): Payer: Self-pay

## 2022-07-20 ENCOUNTER — Other Ambulatory Visit (HOSPITAL_COMMUNITY): Payer: Self-pay

## 2022-07-30 ENCOUNTER — Other Ambulatory Visit (HOSPITAL_COMMUNITY): Payer: Self-pay

## 2022-07-31 ENCOUNTER — Other Ambulatory Visit: Payer: Self-pay

## 2022-07-31 ENCOUNTER — Other Ambulatory Visit (HOSPITAL_COMMUNITY): Payer: Self-pay

## 2022-08-01 ENCOUNTER — Other Ambulatory Visit: Payer: Self-pay | Admitting: Physician Assistant

## 2022-08-01 ENCOUNTER — Other Ambulatory Visit: Payer: Self-pay

## 2022-08-01 MED ORDER — ZOLPIDEM TARTRATE ER 12.5 MG PO TBCR
12.5000 mg | EXTENDED_RELEASE_TABLET | Freq: Every evening | ORAL | 5 refills | Status: DC | PRN
  Filled 2022-08-01: qty 30, 30d supply, fill #0
  Filled 2022-09-13: qty 30, 30d supply, fill #1
  Filled 2022-10-25: qty 30, 30d supply, fill #2
  Filled 2022-12-19: qty 30, 30d supply, fill #3
  Filled 2023-01-16 – 2023-01-22 (×2): qty 30, 30d supply, fill #4

## 2022-08-02 ENCOUNTER — Other Ambulatory Visit: Payer: Self-pay

## 2022-08-02 ENCOUNTER — Encounter: Payer: Self-pay | Admitting: Radiology

## 2022-08-11 ENCOUNTER — Other Ambulatory Visit (HOSPITAL_COMMUNITY): Payer: Self-pay

## 2022-08-12 ENCOUNTER — Other Ambulatory Visit: Payer: Self-pay

## 2022-08-12 ENCOUNTER — Encounter: Payer: Self-pay | Admitting: Internal Medicine

## 2022-08-12 ENCOUNTER — Encounter (HOSPITAL_COMMUNITY): Payer: Self-pay

## 2022-08-12 ENCOUNTER — Emergency Department (HOSPITAL_COMMUNITY)
Admission: EM | Admit: 2022-08-12 | Discharge: 2022-08-12 | Disposition: A | Discharge status: Home / Self Care | Payer: Commercial Managed Care - PPO | Attending: Emergency Medicine | Admitting: Emergency Medicine

## 2022-08-12 DIAGNOSIS — R110 Nausea: Secondary | ICD-10-CM | POA: Diagnosis not present

## 2022-08-12 DIAGNOSIS — R1013 Epigastric pain: Secondary | ICD-10-CM | POA: Insufficient documentation

## 2022-08-12 DIAGNOSIS — J45909 Unspecified asthma, uncomplicated: Secondary | ICD-10-CM | POA: Insufficient documentation

## 2022-08-12 LAB — COMPREHENSIVE METABOLIC PANEL
ALT: 38 U/L (ref 0–44)
AST: 26 U/L (ref 15–41)
Albumin: 4.5 g/dL (ref 3.5–5.0)
Alkaline Phosphatase: 75 U/L (ref 38–126)
Anion gap: 10 (ref 5–15)
BUN: 9 mg/dL (ref 6–20)
CO2: 27 mmol/L (ref 22–32)
Calcium: 9 mg/dL (ref 8.9–10.3)
Chloride: 98 mmol/L (ref 98–111)
Creatinine, Ser: 0.9 mg/dL (ref 0.44–1.00)
GFR, Estimated: >60 mL/min (ref >60)
Glucose, Bld: 87 mg/dL (ref 70–99)
Potassium: 3.2 mmol/L — ABNORMAL LOW (ref 3.5–5.1)
Sodium: 135 mmol/L (ref 135–145)
Total Bilirubin: 0.7 mg/dL (ref 0.3–1.2)
Total Protein: 8.1 g/dL (ref 6.5–8.1)

## 2022-08-12 LAB — URINALYSIS, MICROSCOPIC (REFLEX): Bacteria, UA: NONE SEEN

## 2022-08-12 LAB — LIPASE, BLOOD: Lipase: 32 U/L (ref 11–51)

## 2022-08-12 LAB — CBC WITH DIFFERENTIAL/PLATELET
Abs Immature Granulocytes: 0.01 10*3/uL (ref 0.00–0.07)
Basophils Absolute: 0 10*3/uL (ref 0.0–0.1)
Basophils Relative: 0 %
Eosinophils Absolute: 0.2 10*3/uL (ref 0.0–0.5)
Eosinophils Relative: 4 %
HCT: 44.4 % (ref 36.0–46.0)
Hemoglobin: 14.3 g/dL (ref 12.0–15.0)
Immature Granulocytes: 0 %
Lymphocytes Relative: 32 %
Lymphs Abs: 2.1 10*3/uL (ref 0.7–4.0)
MCH: 27.3 pg (ref 26.0–34.0)
MCHC: 32.2 g/dL (ref 30.0–36.0)
MCV: 84.7 fL (ref 80.0–100.0)
Monocytes Absolute: 0.5 10*3/uL (ref 0.1–1.0)
Monocytes Relative: 8 %
Neutro Abs: 3.7 10*3/uL (ref 1.7–7.7)
Neutrophils Relative %: 56 %
Platelets: 293 10*3/uL (ref 150–400)
RBC: 5.24 MIL/uL — ABNORMAL HIGH (ref 3.87–5.11)
RDW: 12.5 % (ref 11.5–15.5)
WBC: 6.5 10*3/uL (ref 4.0–10.5)
nRBC: 0 % (ref 0.0–0.2)

## 2022-08-12 LAB — URINALYSIS, ROUTINE W REFLEX MICROSCOPIC
Glucose, UA: NEGATIVE mg/dL
Ketones, ur: 40 mg/dL — AB
Leukocytes,Ua: NEGATIVE
Nitrite: NEGATIVE
Protein, ur: 30 mg/dL — AB
Specific Gravity, Urine: >1.03 — ABNORMAL HIGH (ref 1.005–1.030)
pH: 6 (ref 5.0–8.0)

## 2022-08-12 LAB — MAGNESIUM: Magnesium: 1.9 mg/dL (ref 1.7–2.4)

## 2022-08-12 LAB — PREGNANCY, URINE: Preg Test, Ur: NEGATIVE

## 2022-08-12 MED ORDER — DICYCLOMINE HCL 20 MG PO TABS: 20.0000 mg | ORAL_TABLET | Freq: Two times a day (BID) | ORAL | 0 refills | Status: DC | PRN

## 2022-08-12 MED ORDER — SODIUM CHLORIDE 0.9 % IV BOLUS
1000.0000 mL | Freq: Once | INTRAVENOUS | Status: AC
  Administered 2022-08-12: 1000 mL via INTRAVENOUS

## 2022-08-12 MED ORDER — ONDANSETRON 8 MG PO TBDP
8.0000 mg | ORAL_TABLET | Freq: Once | ORAL | Status: AC
  Administered 2022-08-12: 8 mg via ORAL
  Filled 2022-08-12: qty 1

## 2022-08-12 MED ORDER — ONDANSETRON HCL 4 MG PO TABS
4.0000 mg | ORAL_TABLET | Freq: Four times a day (QID) | ORAL | 0 refills | Status: DC
  Filled 2022-08-12: qty 12, 3d supply, fill #0

## 2022-08-12 MED ORDER — ONDANSETRON HCL 4 MG PO TABS: 4.0000 mg | ORAL_TABLET | Freq: Four times a day (QID) | ORAL | 0 refills | Status: DC

## 2022-08-12 MED ORDER — DICYCLOMINE HCL 10 MG PO CAPS
10.0000 mg | ORAL_CAPSULE | Freq: Once | ORAL | Status: AC
  Administered 2022-08-12: 10 mg via ORAL
  Filled 2022-08-12: qty 1

## 2022-08-12 MED ORDER — POTASSIUM CHLORIDE CRYS ER 20 MEQ PO TBCR
40.0000 meq | EXTENDED_RELEASE_TABLET | Freq: Once | ORAL | Status: AC
  Administered 2022-08-12: 40 meq via ORAL
  Filled 2022-08-12: qty 2

## 2022-08-12 MED ORDER — FAMOTIDINE 20 MG PO TABS
20.0000 mg | ORAL_TABLET | Freq: Once | ORAL | Status: AC
  Administered 2022-08-12: 20 mg via ORAL
  Filled 2022-08-12: qty 1

## 2022-08-12 MED ORDER — DICYCLOMINE HCL 20 MG PO TABS
20.0000 mg | ORAL_TABLET | Freq: Two times a day (BID) | ORAL | 0 refills | Status: DC | PRN
  Filled 2022-08-12: qty 20, 10d supply, fill #0

## 2022-08-12 NOTE — ED Triage Notes (Signed)
Pt c/o epipastic abd pain 7/10, N/V x 1 week. Has had cold sweats. Denies any sick contacts. Took antacid with little relief.   Is currently taking weight loss medication for last 10 months.

## 2022-08-12 NOTE — Discharge Instructions (Addendum)
Note the workup today was overall reassuring.  As discussed, your potassium was slightly low; we will supplement this over the next couple days with potassium supplementation.  Will send in Zofran to take as needed for nausea as well as Bentyl to take for abdominal discomfort.  Recommend follow-up with primary care for reassessment of your symptoms.  Please do not hesitate to return to emergency department if the worrisome signs or symptoms we discussed become apparent.

## 2022-08-12 NOTE — ED Provider Notes (Signed)
[facility] Provider Note   CSN: [id number] Arrival date & time: 08/12/22  1352     History  Chief Complaint  Patient presents with   Abdominal Pain    Shannon Gould is a 47 y.o. female.   Abdominal Pain   47 year old female presents emergency department with complaints of abdominal pain and nausea.  Patient reports 2 prior episodes on Wednesday of this past week as well as Friday of this past week where she had epigastric abdominal pain, nausea with subsequent emesis.  Patient on [facility] for weight loss purposes for the past 10 months.  Describes pain as dullness without radiation.  Denies alcohol use, marijuana use.  Denies fever, chills, night sweats, chest pain, shortness of breath, urinary symptoms.  Patient also states that she recently increased her dose of at home Linzess 2 weeks ago and has had regular diarrhea since then.  Patient unaware of her last menstrual cycle states that they have been scarce and irregular since placement of her IUD.  Past medical history significant for bipolar 1 disorder, generalized anxiety disorder, GERD, hiatal hernia, major depressive disorder, panic disorder, asthma  Prior abdominal surgeries including cholecystectomy  Home Medications Prior to Admission medications   Medication Sig Start Date End Date Taking? Authorizing Provider  ALBUTEROL IN Inhale into the lungs as needed.    [provider]  ARIPiprazole (ABILIFY) 20 MG tablet Take 1 tablet (20 mg total) by mouth daily. 06/11/22   Donnal Moat T, PA-C  b complex vitamins tablet Take 1 tablet by mouth daily. 02/01/20   Donnal Moat T, PA-C  benzonatate (TESSALON) 100 MG capsule Take 1 capsule (100 mg total) by mouth 2 (two) times daily as needed for cough. Patient not taking: Reported on 06/11/2022 06/07/22   Montine Circle, PA-C  busPIRone (BUSPAR) 15 MG tablet Take 1 tablet (15 mg total) by mouth 2 (two) times daily. 04/12/22   Addison Lank, PA-C  Cholecalciferol (VITAMIN D) 125 MCG (5000 UT) CAPS Take by mouth.    [provider]  diclofenac (VOLTAREN) 75 MG EC tablet Take 1 tablet (75 mg total) by mouth 2 (two) times daily. 12/20/21   Wallene Huh, DPM  dicyclomine (BENTYL) 20 MG tablet Take 1 tablet (20 mg total) by mouth 2 (two) times daily as needed for spasms. 08/12/22   Wilnette Kales, PA  Dihydroergotamine Mesylate HFA (TRUDHESA) 0.725 MG/ACT AERS Place 1 spray into the nose as directed. Place 1 spray into each nostril.  May repeat once after 1 hour.  Maximum 2 doses in 24 hours. Patient taking differently: Place 1 spray into the nose as directed. Place 1 spray into each nostril.  May repeat once after 1 hour.  Maximum 2 doses in 24 hours. 09/25/21   Tomi Likens, Adam R, DO  Erenumab-aooe (AIMOVIG) 140 MG/ML SOAJ Inject 140 mg (1 pen) as directed every 28 days. 04/12/22   Pieter Partridge, DO  furosemide (LASIX) 40 MG tablet Take 40 mg by mouth daily as needed for fluid or edema.    [provider]  ipratropium (ATROVENT) 0.03 % nasal spray Place 2 sprays into both nostrils every 12 (twelve) hours. 06/07/22   Montine Circle, PA-C  levonorgestrel (MIRENA, 52 MG,) 20 MCG/24HR IUD 1 each by Intrauterine route once.     [provider]  linaclotide (LINZESS) 145 MCG CAPS capsule Take 1 capsule (145 mcg total) by mouth daily. 07/06/22     linaclotide (  LINZESS) 72 MCG capsule Take 1 capsule (72 mcg total) by mouth daily. 07/06/22     LORazepam (ATIVAN) 1 MG tablet Take 1-2 tablets by mouth every 8 (eight) hours as needed for anxiety. 06/11/22   Donnal Moat T, PA-C  Omega-3 Fatty Acids (FISH OIL PO) Take 1 capsule by mouth daily.    [provider]  ondansetron (ZOFRAN) 4 MG tablet Take 1 tablet (4 mg total) by mouth every 6 (six) hours. 08/12/22   Wilnette Kales, PA  pantoprazole (PROTONIX) 20 MG tablet Take 1 tablet (20 mg total) by mouth daily before breakfast. 04/04/22   Vladimir Crofts, PA-C   polyethylene glycol (MIRALAX) 17 g packet Take 17 g by mouth daily. Patient taking differently: Take 17 g by mouth at bedtime. 07/13/21   Vladimir Crofts, PA-C  Semaglutide-Weight Management (WEGOVY) 2.4 MG/0.75ML SOAJ Inject 2.4 mg into the skin once a week. 03/09/22   Buford Dresser, MD  venlafaxine XR (EFFEXOR-XR) 150 MG 24 hr capsule Take 1 capsule (150 mg total) by mouth daily with breakfast. 06/11/22   Donnal Moat T, PA-C  venlafaxine XR (EFFEXOR-XR) 75 MG 24 hr capsule Take 1 capsule (75 mg total) by mouth daily with breakfast. TAKE WITH 150 MG TO EQUAL A DOSE OF 225 MG. 06/11/22   Hurst, Teresa T, PA-C  Wheat Dextrin (BENEFIBER) POWD Use daily as directed Patient taking differently: Take by mouth at bedtime. Use daily as directed 07/13/21   Vladimir Crofts, PA-C  zolpidem (AMBIEN CR) 12.5 MG CR tablet Take 1 tablet (12.5 mg total) by mouth at bedtime as needed for sleep. 08/01/22   Donnal Moat T, PA-C      Allergies    Benzodiazepines, Hydrocodone, Lexapro [escitalopram], Wellbutrin [bupropion], Caffeine, and Lactose intolerance (gi)    Review of Systems   Review of Systems  Gastrointestinal:  Positive for abdominal pain.  All other systems reviewed and are negative.   Physical Exam Updated Vital Signs BP 120/84 (BP Location: Right Arm)   Pulse 80   Temp 98.4 F (36.9 C) (Oral)   Resp 16   Ht '5\' 3"'$  (1.6 m)   Wt 83.9 kg   SpO2 100%   BMI 32.77 kg/m  Physical Exam Vitals and nursing note reviewed.  Constitutional:      General: She is not in acute distress.    Appearance: She is well-developed.  HENT:     Head: Normocephalic and atraumatic.  Eyes:     Conjunctiva/sclera: Conjunctivae normal.  Cardiovascular:     Rate and Rhythm: Normal rate and regular rhythm.     Heart sounds: No murmur heard. Pulmonary:     Effort: Pulmonary effort is normal. No respiratory distress.     Breath sounds: Normal breath sounds.  Abdominal:     Palpations: Abdomen is soft.      Tenderness: There is abdominal tenderness in the epigastric area. There is no right CVA tenderness, left CVA tenderness, guarding or rebound. Negative signs include Murphy's sign and McBurney's sign.  Musculoskeletal:        General: No swelling.     Cervical back: Neck supple.  Skin:    General: Skin is warm and dry.     Capillary Refill: Capillary refill takes less than 2 seconds.  Neurological:     Mental Status: She is alert.  Psychiatric:        Mood and Affect: Mood normal.     ED Results / Procedures / Treatments  Labs (all labs ordered are listed, but only abnormal results are displayed) Labs Reviewed  COMPREHENSIVE METABOLIC PANEL - Abnormal; Notable for the following components:      Result Value   Potassium 3.2 (*)    All other components within normal limits  CBC WITH DIFFERENTIAL/PLATELET - Abnormal; Notable for the following components:   RBC 5.24 (*)    All other components within normal limits  URINALYSIS, ROUTINE W REFLEX MICROSCOPIC - Abnormal; Notable for the following components:   Specific Gravity, Urine >1.030 (*)    Hgb urine dipstick TRACE (*)    Bilirubin Urine SMALL (*)    Ketones, ur 40 (*)    Protein, ur 30 (*)    All other components within normal limits  LIPASE, BLOOD  PREGNANCY, URINE  MAGNESIUM  URINALYSIS, MICROSCOPIC (REFLEX)    EKG None  Radiology No results found.  Procedures Procedures    Medications Ordered in ED Medications  ondansetron (ZOFRAN-ODT) disintegrating tablet 8 mg (8 mg Oral Given 08/12/22 1433)  sodium chloride 0.9 % bolus 1,000 mL (0 mLs Intravenous Stopped 08/12/22 1545)  dicyclomine (BENTYL) capsule 10 mg (10 mg Oral Given 08/12/22 1438)  famotidine (PEPCID) tablet 20 mg (20 mg Oral Given 08/12/22 1438)  potassium chloride SA (KLOR-CON M) CR tablet 40 mEq (40 mEq Oral Given 08/12/22 1542)    ED Course/ Medical Decision Making/ A&P                             Medical Decision Making Amount and/or  Complexity of Data Reviewed Labs: ordered.  Risk Prescription drug management.   This patient presents to the ED for concern of abdominal pain, this involves an extensive number of treatment options, and is a complaint that carries with it a high risk of complications and morbidity.  The differential diagnosis includes gastritis, PUD, cholecystitis, CBD pathology, hepatitis, SBO/LBO, volvulus, ectopic pregnancy, ovarian torsion, PID, nephrolithiasis, pyelonephritis, cystitis, mesenteric ischemia, AAA, pancreatitis   Co morbidities that complicate the patient evaluation  See HPI   Additional history obtained:  Additional history obtained from EMR External records from outside source obtained and reviewed including hospital records   Lab Tests:  I Ordered, and personally interpreted labs.  The pertinent results include: No leukocytosis noted.  No evidence of anemia.  Platelets within normal range.  Mild hypokalemia supplemented orally while emergency department.  Otherwise, electrolytes within normal limits.  No transaminitis.  No renal dysfunction.  Urine pregnancy negative.  Lipase within normal limits.  UA significant for trace hemoglobin, small bilirubin, 40 ketones and 30 protein    Imaging Studies ordered:  Shared decision-making conversation was held with patient regarding holding on imaging and reevaluating after medicines administered and laboratory studies performed.  Upon reevaluation, patient noted significant improvement of symptoms and elected against CT imaging at this time.   Cardiac Monitoring: / EKG:  The patient was maintained on a cardiac monitor.  I personally viewed and interpreted the cardiac monitored which showed an underlying rhythm of: Sinus rhythm   Consultations Obtained:  N/a   Problem List / ED Course / Critical interventions / Medication management  Epigastric abdominal pain I ordered medication including Zofran, Bentyl, Pepcid, potassium  chloride, 1 L normal saline   Reevaluation of the patient after these medicines showed that the patient improved I have reviewed the patients home medicines and have made adjustments as needed   Social Determinants of Health:  Denies tobacco, illicit  drug use.   Test / Admission - Considered:  Epigastric abdominal pain Vitals signs within normal range and stable throughout visit. Laboratory/imaging studies significant for: See above Patient presents emergency department with complaints of epigastric abdominal pain, nausea.  Workup today overall reassuring.  Patient well-appearing, afebrile in no acute distress.  Patient noted near resolution of symptoms with administration of the medicine while in the emergency department.  Query possible GERD versus gastritis versus medication side effect in the form of Wegovy.  Patient will be sent home with antiemetic in the form of Zofran, recommended oral hydration via electrolyte rich fluids as well as famotidine/Bentyl to use as needed.  Doubt pancreatitis, SBO/LBO, CBD pathology, cholecystitis, urinary tract infection, pyelonephritis.  Recommend follow-up with primary care for reassessment of symptoms.  Treatment plan discussed with patient and she acknowledged understanding was agreeable to said plan. Worrisome signs and symptoms were discussed with the patient, and the patient acknowledged understanding to return to the ED if noticed. Patient was stable upon discharge.          Final Clinical Impression(s) / ED Diagnoses Final diagnoses:  Epigastric pain    Rx / DC Orders ED Discharge Orders          Ordered              dicyclomine (BENTYL) 20 MG tablet  2 times daily PRN        08/12/22 1525    ondansetron (ZOFRAN) 4 MG tablet  Every 6 hours        08/12/22 1525              Wilnette Kales, [place] 08/12/22 1705    Noemi Chapel, MD 08/12/22 1733

## 2022-08-13 ENCOUNTER — Other Ambulatory Visit: Payer: Self-pay

## 2022-08-13 ENCOUNTER — Encounter (HOSPITAL_BASED_OUTPATIENT_CLINIC_OR_DEPARTMENT_OTHER): Payer: Self-pay

## 2022-08-16 ENCOUNTER — Other Ambulatory Visit (HOSPITAL_COMMUNITY): Payer: Self-pay

## 2022-08-23 ENCOUNTER — Other Ambulatory Visit: Payer: Self-pay

## 2022-08-24 ENCOUNTER — Other Ambulatory Visit: Payer: Self-pay

## 2022-08-30 ENCOUNTER — Other Ambulatory Visit: Payer: Self-pay

## 2022-09-03 DIAGNOSIS — R87810 Cervical high risk human papillomavirus (HPV) DNA test positive: Secondary | ICD-10-CM | POA: Diagnosis not present

## 2022-09-03 DIAGNOSIS — Z124 Encounter for screening for malignant neoplasm of cervix: Secondary | ICD-10-CM | POA: Diagnosis not present

## 2022-09-03 DIAGNOSIS — Z113 Encounter for screening for infections with a predominantly sexual mode of transmission: Secondary | ICD-10-CM | POA: Diagnosis not present

## 2022-09-05 ENCOUNTER — Other Ambulatory Visit: Payer: Self-pay | Admitting: Obstetrics

## 2022-09-05 DIAGNOSIS — R928 Other abnormal and inconclusive findings on diagnostic imaging of breast: Secondary | ICD-10-CM

## 2022-09-13 ENCOUNTER — Encounter (HOSPITAL_BASED_OUTPATIENT_CLINIC_OR_DEPARTMENT_OTHER): Payer: Self-pay

## 2022-09-13 ENCOUNTER — Other Ambulatory Visit: Payer: Self-pay

## 2022-09-13 ENCOUNTER — Ambulatory Visit: Payer: Commercial Managed Care - PPO | Attending: Family

## 2022-09-13 DIAGNOSIS — R002 Palpitations: Secondary | ICD-10-CM

## 2022-09-13 NOTE — Telephone Encounter (Signed)
Please advise 

## 2022-09-13 NOTE — Progress Notes (Unsigned)
Enrolled patient for a 7 day Zio XT monitor to be mailed to patients home.  

## 2022-09-13 NOTE — Telephone Encounter (Signed)
Are you able to advise due to MD being out of office?

## 2022-09-17 DIAGNOSIS — R002 Palpitations: Secondary | ICD-10-CM

## 2022-09-21 ENCOUNTER — Other Ambulatory Visit (HOSPITAL_BASED_OUTPATIENT_CLINIC_OR_DEPARTMENT_OTHER): Payer: Self-pay | Admitting: Cardiology

## 2022-09-21 ENCOUNTER — Ambulatory Visit
Admission: RE | Admit: 2022-09-21 | Discharge: 2022-09-21 | Disposition: A | Discharge status: Home / Self Care | Payer: Commercial Managed Care - PPO | Source: Ambulatory Visit | Attending: Obstetrics | Admitting: Obstetrics

## 2022-09-21 ENCOUNTER — Ambulatory Visit: Admission: RE | Admit: 2022-09-21 | Payer: Commercial Managed Care - PPO | Source: Ambulatory Visit

## 2022-09-21 DIAGNOSIS — R922 Inconclusive mammogram: Secondary | ICD-10-CM | POA: Diagnosis not present

## 2022-09-21 DIAGNOSIS — R928 Other abnormal and inconclusive findings on diagnostic imaging of breast: Secondary | ICD-10-CM

## 2022-09-24 ENCOUNTER — Other Ambulatory Visit: Payer: Self-pay

## 2022-09-24 ENCOUNTER — Other Ambulatory Visit (HOSPITAL_COMMUNITY): Payer: Self-pay

## 2022-09-24 MED ORDER — WEGOVY 2.4 MG/0.75ML ~~LOC~~ SOAJ
2.4000 mg | SUBCUTANEOUS | 5 refills | Status: DC
  Filled 2022-09-24: qty 3, 28d supply, fill #0
  Filled 2023-03-27: qty 3, 28d supply, fill #1

## 2022-09-24 NOTE — Telephone Encounter (Signed)
Please assist with refill.  Thank you. 

## 2022-09-26 NOTE — Progress Notes (Signed)
NEUROLOGY FOLLOW UP OFFICE NOTE  Shannon Gould [id number]  Assessment/Plan:   Migraine without aura, without status migrainosus, not intractable   1.  Migraine prevention:  Aimovig 140mg .  Advised caffeine cessaton 2.  Migraine rescue:  Trudhesa NS effective but she would like to see if there is a medication that can abort the migraine faster.  Will have her try Nurtec.  Otherwise, she still has [place]. Zofran for nausea 3.  Limit use of pain relievers to no more than 2 days out of week to prevent risk of rebound or medication-overuse headache. 4.  Keep headache diary 5.  Follow up one year   Subjective:  Shannon Gould is a 47 year old left-handed female with generalized anxiety disorder and major depression who follows up for migraines.   UPDATE:  She feels that Tacey Ruiz is not working.  She also started drinking caffeine and has had increase in frequency.   Intensity:  Mild Duration:  2 hours with Trudhesa  Frequency:  Usually once a week Current NSAIDS:  Ibuprofen 600mg , naproxen Current analgesics: none Current triptans: none Current ergotamine: Trudhesa NS Current anti-emetic:  none Current muscle relaxants: None Current anti-anxiolytic: Xanax Current sleep aide: Ambien Current Antihypertensive medications: None Current Antidepressant medications: Venlafaxine XL 225 mg daily, mirtapamine PRN QHS, Abilify Current Anticonvulsant medications: none Current anti-CGRP: Aimovig 140mg   Current Vitamins/Herbal/Supplements: Vitamin D Current Antihistamines/Decongestants: None Other therapy: None Hormone/birth control: Mirena   Caffeine:  no Alcohol:  occasional Smoker:  no Diet:  hydrates Exercise:  no Depression:  yes; Anxiety:  Yes.  She sees a Veterinary surgeon for therapy. Other pain:  Suboccipital pain Sleep hygiene:  Insomnia.  Uses sonata and weighted blanket.   HISTORY:  Onset:  20s Location:  Usually left sided Quality:  pounding Initial Intensity:  6/10.   She denies new headache, thunderclap headache or severe headache that wakes her from sleep. Aura:  no Prodrome:  no Postdrome:  no Associated symptoms:  Nausea, photophobia, phonophobia, blurred vision, left eye droops, osmophobia.  She denies associated vomiting or unilateral numbness or weakness. Initial Duration:  1 day Initial Frequency:  Once a month Initial Frequency of abortive medication: once or twice a month Triggers:  Caffeine Relieving factors:  Laying down Activity:  Aggravates She also reported near daily dull bifrontal headaches which she treats with ibuprofen 600mg .   CT of head from 01/25/2015 was personally reviewed and revealed no acute intracranial abnormalities.   Past NSAIDS: Ibuprofen, naproxen Past analgesics:  Tylenol, Excedrin, Ultracet Past abortive triptans: Maxalt 5 mg, Sumatriptan tablet (caused nausea) Past abortive ergotamine: none Past muscle relaxants:  none Past anti-emetic:  none Past antihypertensive medications:  none Past antidepressant medications: Wellbutrin, Celexa, Zoloft 100 mg Past anticonvulsant medications:  Topiramate ER (cognitive problems) Past anti-CGRP:  none Past vitamins/Herbal/Supplements:  none Past antihistamines/decongestants:  none Other past therapies:  none  PAST MEDICAL HISTORY: Past Medical History:  Diagnosis Date   Asthma    Bipolar 1 disorder (HCC)    Chronic constipation    CIN II (cervical intraepithelial neoplasia II)    GAD (generalized anxiety disorder)    GERD (gastroesophageal reflux disease)    Hiatal hernia    History of iron deficiency anemia    History of palpitations in adulthood    pt evaluated by cardiologist--- dr b. Cristal Deer note in epic 09-06-2021  for atypical chest pain/ palpitations per not pt  had echo approxl 2013 and normal ETT 2020 not further testing and released prn basis  MDD (major depressive disorder)    Migraine without aura and without status migrainosus, not intractable     neurologist--- dr Everlena Cooper   Panic disorder    Urgency of urination    Wears glasses     MEDICATIONS: Current Outpatient Medications on File Prior to Visit  Medication Sig Dispense Refill   ALBUTEROL IN Inhale into the lungs as needed.     ARIPiprazole (ABILIFY) 20 MG tablet Take 1 tablet (20 mg total) by mouth daily. 90 tablet 1   b complex vitamins tablet Take 1 tablet by mouth daily. 30 tablet 11   benzonatate (TESSALON) 100 MG capsule Take 1 capsule (100 mg total) by mouth 2 (two) times daily as needed for cough. (Patient not taking: Reported on 06/11/2022) 20 capsule 0   busPIRone (BUSPAR) 15 MG tablet Take 1 tablet (15 mg total) by mouth 2 (two) times daily. 180 tablet 1   Cholecalciferol (VITAMIN D) 125 MCG (5000 UT) CAPS Take by mouth.     diclofenac (VOLTAREN) 75 MG EC tablet Take 1 tablet (75 mg total) by mouth 2 (two) times daily. 50 tablet 2   dicyclomine (BENTYL) 20 MG tablet Take 1 tablet (20 mg total) by mouth 2 (two) times daily as needed for spasms. 20 tablet 0   Dihydroergotamine Mesylate HFA (TRUDHESA) 0.725 MG/ACT AERS Place 1 spray into the nose as directed. Place 1 spray into each nostril.  May repeat once after 1 hour.  Maximum 2 doses in 24 hours. (Patient taking differently: Place 1 spray into the nose as directed. Place 1 spray into each nostril.  May repeat once after 1 hour.  Maximum 2 doses in 24 hours.) 8 mL 5   Erenumab-aooe (AIMOVIG) 140 MG/ML SOAJ Inject 140 mg (1 pen) as directed every 28 days. 1 mL 5   furosemide (LASIX) 40 MG tablet Take 40 mg by mouth daily as needed for fluid or edema.     ipratropium (ATROVENT) 0.03 % nasal spray Place 2 sprays into both nostrils every 12 (twelve) hours. 30 mL 0   levonorgestrel (MIRENA, 52 MG,) 20 MCG/24HR IUD 1 each by Intrauterine route once.      linaclotide (LINZESS) 145 MCG CAPS capsule Take 1 capsule (145 mcg total) by mouth daily. 90 capsule 1   linaclotide (LINZESS) 72 MCG capsule Take 1 capsule (72 mcg total) by  mouth daily. 30 capsule 1   LORazepam (ATIVAN) 1 MG tablet Take 1-2 tablets by mouth every 8 (eight) hours as needed for anxiety. 60 tablet 5   Omega-3 Fatty Acids (FISH OIL PO) Take 1 capsule by mouth daily.     ondansetron (ZOFRAN) 4 MG tablet Take 1 tablet (4 mg total) by mouth every 6 (six) hours. 12 tablet 0   pantoprazole (PROTONIX) 20 MG tablet Take 1 tablet (20 mg total) by mouth daily before breakfast. 90 tablet 0   polyethylene glycol (MIRALAX) 17 g packet Take 17 g by mouth daily. (Patient taking differently: Take 17 g by mouth at bedtime.) 30 each 3   Semaglutide-Weight Management (WEGOVY) 2.4 MG/0.75ML SOAJ Inject 2.4 mg into the skin once a week. 3 mL 5   venlafaxine XR (EFFEXOR-XR) 150 MG 24 hr capsule Take 1 capsule (150 mg total) by mouth daily with breakfast. 90 capsule 1   venlafaxine XR (EFFEXOR-XR) 75 MG 24 hr capsule Take 1 capsule (75 mg total) by mouth daily with breakfast. TAKE WITH 150 MG TO EQUAL A DOSE OF 225 MG. 90 capsule 1  Wheat Dextrin (BENEFIBER) POWD Use daily as directed (Patient taking differently: Take by mouth at bedtime. Use daily as directed) 529 g 0   zolpidem (AMBIEN CR) 12.5 MG CR tablet Take 1 tablet (12.5 mg total) by mouth at bedtime as needed for sleep. 30 tablet 5   No current facility-administered medications on file prior to visit.    ALLERGIES: Allergies  Allergen Reactions   Benzodiazepines    Hydrocodone Itching   Lexapro [Escitalopram] Other (See Comments)    'Knot in throat w/ trouble swallowing.'   Wellbutrin [Bupropion]     "Suicidal thoughts"   Caffeine Other (See Comments)    Headache    Lactose Intolerance (Gi) Other (See Comments)    gas    FAMILY HISTORY: Family History  Problem Relation Age of Onset   Hypertension Mother    Diabetes Mother    Hypertension Father    Heart disease Father    Sleep apnea Father    Diabetes Father    Heart attack Father        x2   CAD Father    Hyperlipidemia Father    Heart  disease Sister    Anemia Sister    Hypertension Sister    Heart disease Brother    Hypertension Brother    Asthma Maternal Grandmother    Hypertension Paternal Grandmother    Diabetes Paternal Grandmother    Seizures Paternal Grandmother    Diabetes Paternal Grandfather    Anxiety disorder Daughter    Depression Daughter    Asthma Daughter    Asthma Son    Asthma Son       Objective:  Blood pressure 125/65, pulse 83, height 5\' 3"  (1.6 m), weight 196 lb (88.9 kg), SpO2 94 %. General: No acute distress.  Patient appears well-groomed.   Head:  Normocephalic/atraumatic Eyes:  Fundi examined but not visualized Neck: supple, no paraspinal tenderness, full range of motion Heart:  Regular rate and rhythm Neurological Exam: alert and oriented to person, place, and time.  Speech fluent and not dysarthric, language intact.  CN II-XII intact. Bulk and tone normal, muscle strength 5/5 throughout.  Sensation to light touch intact.  Deep tendon reflexes 2+ throughout.  Finger to nose testing intact.  Gait normal, Romberg negative.   Shon Millet, DO  CC: Assunta Found, MD

## 2022-09-28 ENCOUNTER — Encounter: Payer: Self-pay | Admitting: Neurology

## 2022-09-28 ENCOUNTER — Ambulatory Visit: Payer: Commercial Managed Care - PPO | Admitting: Neurology

## 2022-09-28 ENCOUNTER — Other Ambulatory Visit (HOSPITAL_COMMUNITY): Payer: Self-pay

## 2022-09-28 ENCOUNTER — Other Ambulatory Visit: Payer: Self-pay

## 2022-09-28 VITALS — BP 125/65 | HR 83 | Ht 63.0 in | Wt 196.0 lb

## 2022-09-28 DIAGNOSIS — G43009 Migraine without aura, not intractable, without status migrainosus: Secondary | ICD-10-CM

## 2022-09-28 DIAGNOSIS — R002 Palpitations: Secondary | ICD-10-CM | POA: Diagnosis not present

## 2022-09-28 MED ORDER — AIMOVIG 140 MG/ML ~~LOC~~ SOAJ
140.0000 mg | SUBCUTANEOUS | 11 refills | Status: DC
  Filled 2022-09-28 – 2023-01-04 (×4): qty 1, 28d supply, fill #0
  Filled 2023-01-30: qty 1, 28d supply, fill #1
  Filled 2023-02-26: qty 1, 28d supply, fill #2
  Filled 2023-03-27 – 2023-04-10 (×2): qty 1, 28d supply, fill #3
  Filled 2023-06-26: qty 1, 28d supply, fill #4
  Filled 2023-07-20 – 2023-07-22 (×2): qty 1, 28d supply, fill #5
  Filled 2023-08-13: qty 1, 28d supply, fill #6
  Filled 2023-09-13: qty 1, 28d supply, fill #7

## 2022-09-28 MED ORDER — TRUDHESA 0.725 MG/ACT NA AERS
1.0000 | INHALATION_SPRAY | NASAL | 5 refills | Status: DC
  Filled 2022-09-28: qty 8, fill #0

## 2022-09-28 NOTE — Progress Notes (Signed)
Medication Samples have been provided to the patient.  Drug name: Nurtec       Strength: 75 mg        Qty: 1  LOT: [id number]  Exp.Date: 10/2024  Dosing instructions: as needed  The patient has been instructed regarding the correct time, dose, and frequency of taking this medication, including desired effects and most common side effects.   Leida Lauth 8:48 AM 09/28/2022

## 2022-09-28 NOTE — Patient Instructions (Signed)
Aimovig Try Nurtec for migraine attacks (1 in 24 hours) and let me know if it works.  Otherwise, you have the Tacey Ruiz Stop caffeine

## 2022-09-29 ENCOUNTER — Other Ambulatory Visit (HOSPITAL_COMMUNITY): Payer: Self-pay

## 2022-10-01 ENCOUNTER — Other Ambulatory Visit (HOSPITAL_COMMUNITY): Payer: Self-pay

## 2022-10-01 ENCOUNTER — Encounter (HOSPITAL_COMMUNITY): Payer: Self-pay

## 2022-10-02 ENCOUNTER — Other Ambulatory Visit (HOSPITAL_COMMUNITY): Payer: Self-pay

## 2022-10-02 ENCOUNTER — Ambulatory Visit (HOSPITAL_BASED_OUTPATIENT_CLINIC_OR_DEPARTMENT_OTHER): Payer: Commercial Managed Care - PPO | Admitting: Cardiology

## 2022-10-04 ENCOUNTER — Other Ambulatory Visit (HOSPITAL_COMMUNITY): Payer: Self-pay

## 2022-10-04 ENCOUNTER — Other Ambulatory Visit: Payer: Self-pay

## 2022-10-05 ENCOUNTER — Encounter (HOSPITAL_BASED_OUTPATIENT_CLINIC_OR_DEPARTMENT_OTHER): Payer: Self-pay | Admitting: Cardiology

## 2022-10-05 ENCOUNTER — Ambulatory Visit (HOSPITAL_BASED_OUTPATIENT_CLINIC_OR_DEPARTMENT_OTHER): Payer: Commercial Managed Care - PPO | Admitting: Cardiology

## 2022-10-05 VITALS — BP 112/88 | HR 79 | Ht 63.0 in | Wt 194.9 lb

## 2022-10-05 DIAGNOSIS — E6609 Other obesity due to excess calories: Secondary | ICD-10-CM

## 2022-10-05 DIAGNOSIS — R002 Palpitations: Secondary | ICD-10-CM

## 2022-10-05 DIAGNOSIS — Z7189 Other specified counseling: Secondary | ICD-10-CM | POA: Diagnosis not present

## 2022-10-05 DIAGNOSIS — Z712 Person consulting for explanation of examination or test findings: Secondary | ICD-10-CM

## 2022-10-05 DIAGNOSIS — Z7182 Exercise counseling: Secondary | ICD-10-CM | POA: Diagnosis not present

## 2022-10-05 DIAGNOSIS — Z6834 Body mass index (BMI) 34.0-34.9, adult: Secondary | ICD-10-CM | POA: Diagnosis not present

## 2022-10-05 NOTE — Progress Notes (Signed)
Cardiology Office Note:    Date:  10/05/2022   ID:  ULAH NUMAN, DOB Jan 19, 1976, MRN [id number]  PCP:  Shannon Found, MD  Cardiologist:  Shannon Red, MD PhD  Referring MD: Shannon Found, MD   CC: Follow-up  History of Present Illness:    Shannon Gould is a 47 y.o. female with a hx of obesity, depression, anxiety who is being seen for follow up. I initially met her 07/29/2018 as a new consult at the request of Shannon Found, MD for the evaluation and management of chest pain/palpitations.  Family history: father has had 2 Mis, has three stents. First was in his late 40s/early 18s. Mother and father, as well as sister and brother, have high blood pressure. Shannon Gould had both MI and stroke.   Today: Down nearly 60 lbs on Wegovy, congratulated. Feels great. Went on a hike and did great. No significant side effects currently, had mild nausea at the beginning. Stays away from greasy foods. Hasn't had to take lasix in months.  Because insurance will stop covering, she is stretching out to self taper the medication, now taking every other week. Discussed weight bearing exercise to minimize weight regain.   Reviewed her monitor results together today.  Denies chest pain, shortness of breath at rest or with normal exertion. No PND, orthopnea, LE edema or unexpected weight gain. No syncope or palpitations.   Past Medical History:  Diagnosis Date   Asthma    Bipolar 1 disorder (HCC)    Chronic constipation    CIN II (cervical intraepithelial neoplasia II)    GAD (generalized anxiety disorder)    GERD (gastroesophageal reflux disease)    Hiatal hernia    History of iron deficiency anemia    History of palpitations in adulthood    pt evaluated by cardiologist--- Shannon Gould note in epic 09-06-2021  for atypical chest pain/ palpitations per not pt  had echo approxl 2013 and normal ETT 2020 not further testing and released prn basis   MDD (major depressive disorder)     Migraine without aura and without status migrainosus, not intractable    neurologist--- Shannon Shannon Gould   Panic disorder    Urgency of urination    Wears glasses     Past Surgical History:  Procedure Laterality Date   CERVICAL CONIZATION W/BX N/A 01/22/2022   Procedure: CONIZATION CERVIX WITH BIOPSY: COLD KNIFE CONE;  Surgeon: Shannon Baars, MD;  Location: [facility] [place];  Service: Gynecology;  Laterality: N/A;   COLPOSCOPY  01/22/2022   CYSTOSCOPY WITH URETHRAL DILATATION     age 55   ERCP  2006   @[facility]    LAPAROSCOPIC CHOLECYSTECTOMY  2002   @[facility]    REMOVAL OF URINARY SLING  02/17/2018   @[facility] ;   vaginal urethrolysis , partial removal midurethral sling   URETHRAL SLING  08/08/2010   @[facility]  by Shannon Shannon Gould;    Tension-free vaginal tape    Current Medications: Current Outpatient Medications on File Prior to Visit  Medication Sig   ALBUTEROL IN Inhale into the lungs as needed.   ARIPiprazole (ABILIFY) 20 MG tablet Take 1 tablet (20 mg total) by mouth daily.   b complex vitamins tablet Take 1 tablet by mouth daily.   busPIRone (BUSPAR) 15 MG tablet Take 1 tablet (15 mg total) by mouth 2 (two) times daily.   Cholecalciferol (VITAMIN D) 125 MCG (5000 UT) CAPS Take by mouth.   dicyclomine (BENTYL) 20 MG tablet Take 1 tablet (20 mg  total) by mouth 2 (two) times daily as needed for spasms.   Dihydroergotamine Mesylate HFA (TRUDHESA) 0.725 MG/ACT AERS Place 1 spray into the nose as directed. Place 1 spray into each nostril.  May repeat once after 1 hour.  Maximum 2 doses in 24 hours.   Erenumab-aooe (AIMOVIG) 140 MG/ML SOAJ Inject 140 mg as directed every 28 (twenty-eight) days.   furosemide (LASIX) 40 MG tablet Take 40 mg by mouth daily as needed for fluid or edema.   ipratropium (ATROVENT) 0.03 % nasal spray Place 2 sprays into both nostrils every 12 (twelve) hours.   levonorgestrel (MIRENA, 52 MG,) 20 MCG/24HR IUD 1 each by Intrauterine route once.    linaclotide (LINZESS) 145 MCG  CAPS capsule Take 1 capsule (145 mcg total) by mouth daily.   LORazepam (ATIVAN) 1 MG tablet Take 1-2 tablets by mouth every 8 (eight) hours as needed for anxiety.   Omega-3 Fatty Acids (FISH OIL PO) Take 1 capsule by mouth daily.   ondansetron (ZOFRAN) 4 MG tablet Take 1 tablet (4 mg total) by mouth every 6 (six) hours.   pantoprazole (PROTONIX) 20 MG tablet Take 1 tablet (20 mg total) by mouth daily before breakfast.   polyethylene glycol (MIRALAX) 17 g packet Take 17 g by mouth daily. (Patient taking differently: Take 17 g by mouth at bedtime.)   Semaglutide-Weight Management (WEGOVY) 2.4 MG/0.75ML SOAJ Inject 2.4 mg into the skin once a week.   venlafaxine XR (EFFEXOR-XR) 150 MG 24 hr capsule Take 1 capsule (150 mg total) by mouth daily with breakfast.   venlafaxine XR (EFFEXOR-XR) 75 MG 24 hr capsule Take 1 capsule (75 mg total) by mouth daily with breakfast. TAKE WITH 150 MG TO EQUAL A DOSE OF 225 MG.   Wheat Dextrin (BENEFIBER) POWD Use daily as directed (Patient taking differently: Take by mouth at bedtime. Use daily as directed)   zolpidem (AMBIEN CR) 12.5 MG CR tablet Take 1 tablet (12.5 mg total) by mouth at bedtime as needed for sleep.   No current facility-administered medications on file prior to visit.     Allergies:   Benzodiazepines, Hydrocodone, Lexapro [escitalopram], Wellbutrin [bupropion], Caffeine, and Lactose intolerance (gi)   Social History   Tobacco Use   Smoking status: Never   Smokeless tobacco: Never  Vaping Use   Vaping Use: Never used  Substance Use Topics   Alcohol use: Not Currently    Comment: rare   Drug use: Never      Family History: The patient's family history includes Anemia in her sister; Anxiety disorder in her daughter; Asthma in her daughter, maternal grandmother, son, and son; CAD in her father; Depression in her daughter; Diabetes in her father, mother, paternal grandfather, and paternal grandmother; Heart attack in her father; Heart  disease in her brother, father, and sister; Hyperlipidemia in her father; Hypertension in her brother, father, mother, paternal grandmother, and sister; Seizures in her paternal grandmother; Sleep apnea in her father.  ROS:   Please see the history of present illness. All other systems are reviewed and negative.    EKGs/Labs/Other Studies Reviewed:    The following studies were reviewed today: Monitor 09/28/22 Patch Wear Time:  6 days and 23 hours (2024-04-15T22:37:22-0400 to 2024-04-22T22:05:08-398)   Patient had a min HR of 36 bpm, max HR of 135 bpm, and avg HR of 85 bpm. Predominant underlying rhythm was Sinus Rhythm. No VT, SVT, atrial fibrillation, high degree block, or pauses noted. Isolated atrial and ventricular ectopy was rare (<1%).  There were 19 triggered events. These were sinus with single PVC. No high risk arrhythmias detected.  ETT 08/07/2018: Blood pressure demonstrated a normal response to exercise. There was no ST segment deviation noted during stress.   ETT with normal exercise tolerance (6:29); No CP; normal BP response; no ST changes; negative adequate ETT; Duke treadmill score 6.  EKG:  EKG is personally reviewed.   10/05/22: NSR at 79 bpm 12/08/2021: EKG was not ordered. 09/06/2021: NSR at 82 bpm 07/29/2018: normal sinus rhythm  Recent Labs: 03/09/2022: TSH 0.737 08/12/2022: ALT 38; BUN 9; Creatinine, Ser 0.90; Hemoglobin 14.3; Magnesium 1.9; Platelets 293; Potassium 3.2; Sodium 135  Recent Lipid Panel    Component Value Date/Time   CHOL 207 (H) 12/08/2021 1309   TRIG 64 12/08/2021 1309   HDL 60 12/08/2021 1309   CHOLHDL 3.5 12/08/2021 1309   LDLCALC 136 (H) 12/08/2021 1309     Physical Exam:    VS:  BP 112/88 (BP Location: Left Arm, Patient Position: Sitting, Cuff Size: Normal)   Pulse 79   Ht 5\' 3"  (1.6 m)   Wt 194 lb 14.4 oz (88.4 kg)   BMI 34.52 kg/m     Wt Readings from Last 3 Encounters:  10/05/22 194 lb 14.4 oz (88.4 kg)  09/28/22 196 lb (88.9 kg)   08/12/22 185 lb (83.9 kg)    GEN: Well nourished, well developed in no acute distress HEENT: Normal, moist mucous membranes NECK: No JVD CARDIAC: regular rhythm, normal S1 and S2, no rubs or gallops. No murmur. VASCULAR: Radial and DP pulses 2+ bilaterally. No carotid bruits RESPIRATORY:  Clear to auscultation without rales, wheezing or rhonchi  ABDOMEN: Soft, non-tender, non-distended MUSCULOSKELETAL:  Ambulates independently SKIN: Warm and dry, no edema NEUROLOGIC:  Alert and oriented x 3. No focal neuro deficits noted. PSYCHIATRIC:  Normal affect      ASSESSMENT:    1. Encounter to discuss test results   2. Palpitations   3. Exercise counseling   4. Counseling on health promotion and disease prevention   5. Class 1 obesity due to excess calories without serious comorbidity with body mass index (BMI) of 34.0 to 34.9 in adult    PLAN:    Palpitations: reviewed monitor, single PVCs, overall burden <1%. Reassuring.  Chest discomfort: improved -improved -prior ETT reassuring -reviewed Gould flag warning signs that need immediate medical attention  Obesity, BMI 43->34. Has lost ~60 lbs At risk for metabolic syndrome: aripiprazole increases her risk for metabolic syndrome. -doing well on semaglutide, unfortunately this is no longer covered by [employer]. She is taking 2.4 mg every other week until she runs out -reviewed diet, exercise recommendations at length. Recommend weight bearing exercise to decrease weight regain  Intermittent LE edema -hasn't needed lasix in months  -recommend heart healthy/Mediterranean diet, with whole grains, fruits, vegetable, fish, lean meats, nuts, and olive oil. Limit salt. -recommend moderate walking, 3-5 times/week for 30-50 minutes each session. Aim for at least 150 minutes.week. Goal should be pace of 3 miles/hours, or walking 1.5 miles in 30 minutes -recommend avoidance of tobacco products. Avoid excess alcohol. -ASCVD risk  score: The 10-year ASCVD risk score (Arnett DK, et al., 2019) is: 0.4%   Values used to calculate the score:     Age: 62 years     Sex: Female     Is Non-Hispanic African American: Yes     Diabetic: No     Tobacco smoker: No     Systolic Blood Pressure:  112 mmHg     Is BP treated: No     HDL Cholesterol: 60 mg/dL     Total Cholesterol: 207 mg/dL    Plan for follow up: 6 months  Medication Adjustments/Labs and Tests Ordered: Current medicines are reviewed at length with the patient today.  Concerns regarding medicines are outlined above.   Orders Placed This Encounter  Procedures   EKG 12-Lead   No orders of the defined types were placed in this encounter.   Patient Instructions  Medication Instructions:  The current medical regimen is effective;  continue present plan and medications.   *If you need a refill on your cardiac medications before your next appointment, please call your pharmacy*   Lab Work: None   Testing/Procedures: None   Follow-Up: At [facility], you and your health needs are our priority.  As part of our continuing mission to provide you with exceptional heart care, we have created designated Provider Care Teams.  These Care Teams include your primary Cardiologist (physician) and Advanced Practice Providers (APPs -  Physician Assistants and Nurse Practitioners) who all work together to provide you with the care you need, when you need it.  We recommend signing up for the patient portal called "MyChart".  Sign up information is provided on this After Visit Summary.  MyChart is used to connect with patients for Virtual Visits (Telemedicine).  Patients are able to view lab/test results, encounter notes, upcoming appointments, etc.  Non-urgent messages can be sent to your provider as well.   To learn more about what you can do with MyChart, go to [email or web address].    Your next appointment:   6 month(s)  Provider:   Jodelle Red, MD    Other Instructions None    Signed, Shannon Red, MD PhD 10/05/2022     [facility]

## 2022-10-05 NOTE — Patient Instructions (Signed)
Medication Instructions:  The current medical regimen is effective;  continue present plan and medications.   *If you need a refill on your cardiac medications before your next appointment, please call your pharmacy*   Lab Work: None  Testing/Procedures: None   Follow-Up: At [facility], you and your health needs are our priority.  As part of our continuing mission to provide you with exceptional heart care, we have created designated Provider Care Teams.  These Care Teams include your primary Cardiologist (physician) and Advanced Practice Providers (APPs -  Physician Assistants and Nurse Practitioners) who all work together to provide you with the care you need, when you need it.  We recommend signing up for the patient portal called "MyChart".  Sign up information is provided on this After Visit Summary.  MyChart is used to connect with patients for Virtual Visits (Telemedicine).  Patients are able to view lab/test results, encounter notes, upcoming appointments, etc.  Non-urgent messages can be sent to your provider as well.   To learn more about what you can do with MyChart, go to [email or web address].    Your next appointment:   6 month(s)  Provider:   Bridgette Christopher, MD    Other Instructions None  

## 2022-10-08 ENCOUNTER — Other Ambulatory Visit (HOSPITAL_COMMUNITY): Payer: Self-pay

## 2022-10-09 ENCOUNTER — Other Ambulatory Visit (HOSPITAL_COMMUNITY): Payer: Self-pay

## 2022-10-10 ENCOUNTER — Other Ambulatory Visit (HOSPITAL_COMMUNITY): Payer: Self-pay

## 2022-10-10 ENCOUNTER — Other Ambulatory Visit: Payer: Self-pay

## 2022-10-10 ENCOUNTER — Other Ambulatory Visit: Payer: Self-pay | Admitting: Physician Assistant

## 2022-10-11 ENCOUNTER — Other Ambulatory Visit: Payer: Self-pay

## 2022-10-11 ENCOUNTER — Other Ambulatory Visit (HOSPITAL_COMMUNITY): Payer: Self-pay

## 2022-10-11 MED ORDER — BUSPIRONE HCL 15 MG PO TABS
15.0000 mg | ORAL_TABLET | Freq: Two times a day (BID) | ORAL | 1 refills | Status: DC
  Filled 2022-10-11: qty 180, 90d supply, fill #0
  Filled 2023-01-16 – 2023-01-22 (×2): qty 180, 90d supply, fill #1

## 2022-10-12 ENCOUNTER — Other Ambulatory Visit (HOSPITAL_COMMUNITY): Payer: Self-pay

## 2022-10-19 DIAGNOSIS — R87612 Low grade squamous intraepithelial lesion on cytologic smear of cervix (LGSIL): Secondary | ICD-10-CM | POA: Diagnosis not present

## 2022-10-19 DIAGNOSIS — Z3202 Encounter for pregnancy test, result negative: Secondary | ICD-10-CM | POA: Diagnosis not present

## 2022-10-25 ENCOUNTER — Other Ambulatory Visit (HOSPITAL_COMMUNITY): Payer: Self-pay

## 2022-11-10 ENCOUNTER — Other Ambulatory Visit (HOSPITAL_COMMUNITY): Payer: Self-pay

## 2022-11-12 ENCOUNTER — Other Ambulatory Visit (HOSPITAL_COMMUNITY): Payer: Self-pay

## 2022-11-12 ENCOUNTER — Other Ambulatory Visit: Payer: Self-pay

## 2022-11-15 ENCOUNTER — Other Ambulatory Visit (HOSPITAL_COMMUNITY): Payer: Self-pay

## 2022-11-15 DIAGNOSIS — Z23 Encounter for immunization: Secondary | ICD-10-CM | POA: Diagnosis not present

## 2022-12-14 ENCOUNTER — Ambulatory Visit: Payer: Commercial Managed Care - PPO | Admitting: Physician Assistant

## 2022-12-14 ENCOUNTER — Other Ambulatory Visit (HOSPITAL_COMMUNITY): Payer: Self-pay

## 2022-12-14 ENCOUNTER — Other Ambulatory Visit: Payer: Self-pay

## 2022-12-14 ENCOUNTER — Encounter: Payer: Self-pay | Admitting: Physician Assistant

## 2022-12-14 DIAGNOSIS — R7989 Other specified abnormal findings of blood chemistry: Secondary | ICD-10-CM | POA: Diagnosis not present

## 2022-12-14 DIAGNOSIS — F319 Bipolar disorder, unspecified: Secondary | ICD-10-CM

## 2022-12-14 DIAGNOSIS — G4700 Insomnia, unspecified: Secondary | ICD-10-CM

## 2022-12-14 DIAGNOSIS — R5383 Other fatigue: Secondary | ICD-10-CM

## 2022-12-14 DIAGNOSIS — Z79899 Other long term (current) drug therapy: Secondary | ICD-10-CM

## 2022-12-14 DIAGNOSIS — F411 Generalized anxiety disorder: Secondary | ICD-10-CM | POA: Diagnosis not present

## 2022-12-14 MED ORDER — ARIPIPRAZOLE 20 MG PO TABS
20.0000 mg | ORAL_TABLET | Freq: Every day | ORAL | 1 refills | Status: DC
  Filled 2022-12-14 – 2023-01-22 (×3): qty 90, 90d supply, fill #0
  Filled 2023-04-17: qty 90, 90d supply, fill #1

## 2022-12-14 MED ORDER — VENLAFAXINE HCL ER 150 MG PO CP24
150.0000 mg | ORAL_CAPSULE | Freq: Every day | ORAL | 1 refills | Status: DC
  Filled 2022-12-14 – 2023-01-22 (×3): qty 90, 90d supply, fill #0
  Filled 2023-04-17: qty 90, 90d supply, fill #1

## 2022-12-14 MED ORDER — VENLAFAXINE HCL ER 75 MG PO CP24
75.0000 mg | ORAL_CAPSULE | Freq: Every day | ORAL | 1 refills | Status: DC
  Filled 2022-12-14 – 2023-01-22 (×3): qty 90, 90d supply, fill #0
  Filled 2023-04-17: qty 90, 90d supply, fill #1

## 2022-12-14 MED ORDER — LORAZEPAM 1 MG PO TABS
1.0000 mg | ORAL_TABLET | Freq: Three times a day (TID) | ORAL | 5 refills | Status: DC | PRN
  Filled 2022-12-14 – 2022-12-19 (×2): qty 60, 10d supply, fill #0

## 2022-12-14 NOTE — Progress Notes (Signed)
[facility] Med Check  Patient ID: Shannon Gould,  MRN: [id number]  PCP: Assunta Found, MD  Date of Evaluation: 12/14/2022 Time spent: 29 mins  Chief Complaint:  Chief Complaint   Anxiety; Depression; Insomnia; Follow-up    HISTORY/CURRENT STATUS: HPI for routine med check.  [name] doing well.  She feels like her medications are still working as they should.  If she does not take the Ambien she has trouble staying asleep and then she feels bad the next day.  Other than that she has no complaints.  She occasionally gets anxious but it is usually at home after work and if she gets overwhelmed.  Rarely has panic attacks.  The Ativan does help.  Patient is able to enjoy things.  Energy and motivation are good.  Work is going well.   No extreme sadness, tearfulness, or feelings of hopelessness. ADLs and personal hygiene are normal.   Denies any changes in concentration, making decisions, or remembering things.  Appetite has not changed.  Weight is stable.  Is still on Wegovy for now but ins not paying going forward. It has helped her lose wt.  Denies suicidal or homicidal thoughts.  Patient denies increased energy with decreased need for sleep, increased talkativeness, racing thoughts, impulsivity or risky behaviors, increased spending, increased libido, grandiosity, increased irritability or anger, paranoia, or hallucinations.  Denies dizziness, syncope, seizures, numbness, tingling, tremor, tics, unsteady gait, slurred speech, confusion. Denies muscle or joint pain, stiffness, or dystonia. Denies unexplained weight loss, frequent infections, or sores that heal slowly.  No polyphagia, polydipsia, or polyuria. Denies visual changes or paresthesias.   Individual Medical History/ Review of Systems: Changes? :No      Past medications for mental health diagnoses include: Effexor, Vistaril, Xanax, Ambien, Zoloft, Wellbutrin which caused increased agitation and suicidal ideations, trazodone,  Abilify, Sonata, Valium ( not sure why it was stopped) mirtazapine was not very effective  Allergies: Benzodiazepines, Hydrocodone, Lexapro [escitalopram], Wellbutrin [bupropion], Caffeine, and Lactose intolerance (gi)  Current Medications:  Current Outpatient Medications:    ALBUTEROL IN, Inhale into the lungs as needed., Disp: , Rfl:    b complex vitamins tablet, Take 1 tablet by mouth daily., Disp: 30 tablet, Rfl: 11   busPIRone (BUSPAR) 15 MG tablet, Take 1 tablet (15 mg total) by mouth 2 (two) times daily., Disp: 180 tablet, Rfl: 1   Cholecalciferol (VITAMIN D) 125 MCG (5000 UT) CAPS, Take by mouth., Disp: , Rfl:    Dihydroergotamine Mesylate HFA (TRUDHESA) 0.725 MG/ACT AERS, Place 1 spray into the nose as directed. Place 1 spray into each nostril.  May repeat once after 1 hour.  Maximum 2 doses in 24 hours., Disp: 8 mL, Rfl: 5   Erenumab-aooe (AIMOVIG) 140 MG/ML SOAJ, Inject 140 mg as directed every 28 (twenty-eight) days., Disp: 1 mL, Rfl: 11   furosemide (LASIX) 40 MG tablet, Take 40 mg by mouth daily as needed for fluid or edema., Disp: , Rfl:    levonorgestrel (MIRENA, 52 MG,) 20 MCG/24HR IUD, 1 each by Intrauterine route once. , Disp: , Rfl:    linaclotide (LINZESS) 145 MCG CAPS capsule, Take 1 capsule (145 mcg total) by mouth daily., Disp: 90 capsule, Rfl: 1   Omega-3 Fatty Acids (FISH OIL PO), Take 1 capsule by mouth daily., Disp: , Rfl:    ondansetron (ZOFRAN) 4 MG tablet, Take 1 tablet (4 mg total) by mouth every 6 (six) hours., Disp: 12 tablet, Rfl: 0   polyethylene glycol (MIRALAX) 17 g packet, Take 17  g by mouth daily. (Patient taking differently: Take 17 g by mouth at bedtime.), Disp: 30 each, Rfl: 3   Semaglutide-Weight Management (WEGOVY) 2.4 MG/0.75ML SOAJ, Inject 2.4 mg into the skin once a week., Disp: 3 mL, Rfl: 5   zolpidem (AMBIEN CR) 12.5 MG CR tablet, Take 1 tablet (12.5 mg total) by mouth at bedtime as needed for sleep., Disp: 30 tablet, Rfl: 5   ARIPiprazole  (ABILIFY) 20 MG tablet, Take 1 tablet (20 mg total) by mouth daily., Disp: 90 tablet, Rfl: 1   dicyclomine (BENTYL) 20 MG tablet, Take 1 tablet (20 mg total) by mouth 2 (two) times daily as needed for spasms. (Patient not taking: Reported on 12/14/2022), Disp: 20 tablet, Rfl: 0   ipratropium (ATROVENT) 0.03 % nasal spray, Place 2 sprays into both nostrils every 12 (twelve) hours. (Patient not taking: Reported on 12/14/2022), Disp: 30 mL, Rfl: 0   LORazepam (ATIVAN) 1 MG tablet, Take 1-2 tablets by mouth every 8 (eight) hours as needed for anxiety., Disp: 60 tablet, Rfl: 5   pantoprazole (PROTONIX) 20 MG tablet, Take 1 tablet (20 mg total) by mouth daily before breakfast. (Patient not taking: Reported on 12/14/2022), Disp: 90 tablet, Rfl: 0   venlafaxine XR (EFFEXOR-XR) 150 MG 24 hr capsule, Take 1 capsule (150 mg total) by mouth daily with breakfast., Disp: 90 capsule, Rfl: 1   venlafaxine XR (EFFEXOR-XR) 75 MG 24 hr capsule, Take 1 capsule (75 mg total) by mouth daily with breakfast. TAKE WITH 150 MG TO EQUAL A DOSE OF 225 MG., Disp: 90 capsule, Rfl: 1   Wheat Dextrin (BENEFIBER) POWD, Use daily as directed (Patient not taking: Reported on 12/14/2022), Disp: 529 g, Rfl: 0 Medication Side Effects: none  Family Medical/ Social History: Changes?  No  MENTAL HEALTH EXAM:  There were no vitals taken for this visit.There is no height or weight on file to calculate BMI.  General Appearance: Casual and Well Groomed  Eye Contact:  Good  Speech:  Clear and Coherent and Normal Rate  Volume:  Normal  Mood:  Euthymic  Affect:  Congruent  Thought Process:  Goal Directed and Descriptions of Associations: Circumstantial  Orientation:  Full (Time, Place, and Person)  Thought Content: Logical   Suicidal Thoughts:  No  Homicidal Thoughts:  No  Memory:  WNL  Judgement:  Good  Insight:  Good  Psychomotor Activity:  Normal  Concentration:  Concentration: Good and Attention Span: Good  Recall:  Good  Fund of  Knowledge: Good  Language: Good  Assets:  Desire for Improvement Financial Resources/Insurance Housing Transportation Vocational/Educational  ADL's:  Intact  Cognition: WNL  Prognosis:  Good   DIAGNOSES:    ICD-10-CM   1. Bipolar I disorder (HCC)  F31.9 CBC with Differential/Platelet    Comprehensive metabolic panel    Hemoglobin A1c    Lipid panel    VITAMIN D 25 Hydroxy (Vit-D Deficiency, Fractures)    TSH    2. GAD (generalized anxiety disorder)  F41.1     3. Insomnia, unspecified type  G47.00     4. Low vitamin D level  R79.89 VITAMIN D 25 Hydroxy (Vit-D Deficiency, Fractures)    5. Fatigue, unspecified type  R53.83 CBC with Differential/Platelet    Hemoglobin A1c    VITAMIN D 25 Hydroxy (Vit-D Deficiency, Fractures)    TSH    6. Encounter for long-term (current) use of medications  Z79.899 CBC with Differential/Platelet    Comprehensive metabolic panel    Hemoglobin A1c  Lipid panel    VITAMIN D 25 Hydroxy (Vit-D Deficiency, Fractures)     Receiving Psychotherapy: No   RECOMMENDATIONS:  PDMP was reviewed. Last Ambien filled 10/25/2022.  Last Ativan filled 11/15/2022. I provided 29 minutes of face to face time during this encounter, including time spent before and after the visit in records review, medical decision making, counseling pertinent to today's visit, and charting.   I am glad to see her doing so well.  No changes in treatment needed. Sleep hygiene discussed.  Continue Abilify 20 mg, 1 p.o. every morning. Continue BuSpar 15 mg, 1 tablet twice daily for anxiety. Continue Ativan 1 mg, 1-2 p.o. 3 times daily as needed anxiety. Continue Effexor XR 150 mg +75 mg= 225 mg p.o. daily. Continue Ambien  CR 12.5 mg, 1 p.o. nightly as needed. Fasting labs ordered as above. Return in 6 months.  Melony Overly, PA-C

## 2022-12-15 LAB — COMPREHENSIVE METABOLIC PANEL
ALT: 23 IU/L (ref 0–32)
AST: 16 IU/L (ref 0–40)
Albumin: 4.4 g/dL (ref 3.9–4.9)
Alkaline Phosphatase: 90 IU/L (ref 44–121)
BUN/Creatinine Ratio: 12 (ref 9–23)
BUN: 10 mg/dL (ref 6–24)
Bilirubin Total: 0.3 mg/dL (ref 0.0–1.2)
CO2: 23 mmol/L (ref 20–29)
Calcium: 9.1 mg/dL (ref 8.7–10.2)
Chloride: 101 mmol/L (ref 96–106)
Creatinine, Ser: 0.81 mg/dL (ref 0.57–1.00)
Globulin, Total: 2.7 g/dL (ref 1.5–4.5)
Glucose: 78 mg/dL (ref 70–99)
Potassium: 4.2 mmol/L (ref 3.5–5.2)
Sodium: 140 mmol/L (ref 134–144)
Total Protein: 7.1 g/dL (ref 6.0–8.5)
eGFR: 92 mL/min/{1.73_m2} (ref >59)

## 2022-12-15 LAB — CBC WITH DIFFERENTIAL/PLATELET
Basophils Absolute: 0 10*3/uL (ref 0.0–0.2)
Basos: 0 %
EOS (ABSOLUTE): 0.1 10*3/uL (ref 0.0–0.4)
Eos: 2 %
Hematocrit: 42.1 % (ref 34.0–46.6)
Hemoglobin: 13.5 g/dL (ref 11.1–15.9)
Immature Grans (Abs): 0 10*3/uL (ref 0.0–0.1)
Immature Granulocytes: 0 %
Lymphocytes Absolute: 2.8 10*3/uL (ref 0.7–3.1)
Lymphs: 45 %
MCH: 27.4 pg (ref 26.6–33.0)
MCHC: 32.1 g/dL (ref 31.5–35.7)
MCV: 85 fL (ref 79–97)
Monocytes Absolute: 0.5 10*3/uL (ref 0.1–0.9)
Monocytes: 8 %
Neutrophils Absolute: 2.9 10*3/uL (ref 1.4–7.0)
Neutrophils: 45 %
Platelets: 323 10*3/uL (ref 150–450)
RBC: 4.93 x10E6/uL (ref 3.77–5.28)
RDW: 11.8 % (ref 11.7–15.4)
WBC: 6.3 10*3/uL (ref 3.4–10.8)

## 2022-12-15 LAB — LIPID PANEL
Chol/HDL Ratio: 3.8 ratio (ref 0.0–4.4)
Cholesterol, Total: 226 mg/dL — ABNORMAL HIGH (ref 100–199)
HDL: 60 mg/dL (ref >39)
LDL Chol Calc (NIH): 155 mg/dL — ABNORMAL HIGH (ref 0–99)
Triglycerides: 62 mg/dL (ref 0–149)
VLDL Cholesterol Cal: 11 mg/dL (ref 5–40)

## 2022-12-15 LAB — HEMOGLOBIN A1C
Est. average glucose Bld gHb Est-mCnc: 108 mg/dL
Hgb A1c MFr Bld: 5.4 % (ref 4.8–5.6)

## 2022-12-15 LAB — VITAMIN D 25 HYDROXY (VIT D DEFICIENCY, FRACTURES): Vit D, 25-Hydroxy: 39.2 ng/mL (ref 30.0–100.0)

## 2022-12-15 LAB — TSH: TSH: 0.567 u[IU]/mL (ref 0.450–4.500)

## 2022-12-17 NOTE — Progress Notes (Signed)
Please let her know that CBC and CMP are all completely normal.  Her blood sugar was 78.  Hemoglobin A1c 5.4.  Total cholesterol is 226 which is high, triglycerides 62, HDL 60, LDL 155.  Vitamin D level is 39.2, TSH normal at 0.567. Recommend low-fat diet and increase exercise to help decrease the cholesterol.  Also make sure she is taking the vitamin D daily 5000 IUs. Thank you.

## 2022-12-19 ENCOUNTER — Other Ambulatory Visit (HOSPITAL_COMMUNITY): Payer: Self-pay

## 2022-12-22 ENCOUNTER — Other Ambulatory Visit (HOSPITAL_COMMUNITY): Payer: Self-pay

## 2023-01-04 ENCOUNTER — Other Ambulatory Visit: Payer: Self-pay

## 2023-01-04 ENCOUNTER — Other Ambulatory Visit (HOSPITAL_COMMUNITY): Payer: Self-pay

## 2023-01-14 DIAGNOSIS — Z23 Encounter for immunization: Secondary | ICD-10-CM | POA: Diagnosis not present

## 2023-01-16 ENCOUNTER — Other Ambulatory Visit (HOSPITAL_COMMUNITY): Payer: Self-pay

## 2023-01-16 ENCOUNTER — Other Ambulatory Visit: Payer: Self-pay

## 2023-01-19 ENCOUNTER — Other Ambulatory Visit (HOSPITAL_COMMUNITY): Payer: Self-pay

## 2023-01-21 ENCOUNTER — Other Ambulatory Visit (HOSPITAL_COMMUNITY): Payer: Self-pay

## 2023-01-22 ENCOUNTER — Other Ambulatory Visit (HOSPITAL_COMMUNITY): Payer: Self-pay

## 2023-01-30 ENCOUNTER — Other Ambulatory Visit (HOSPITAL_COMMUNITY): Payer: Self-pay

## 2023-02-22 ENCOUNTER — Ambulatory Visit: Payer: Commercial Managed Care - PPO | Admitting: Podiatry

## 2023-02-27 ENCOUNTER — Other Ambulatory Visit (HOSPITAL_COMMUNITY): Payer: Self-pay

## 2023-03-01 ENCOUNTER — Ambulatory Visit: Payer: Commercial Managed Care - PPO | Admitting: Podiatry

## 2023-03-01 ENCOUNTER — Ambulatory Visit (INDEPENDENT_AMBULATORY_CARE_PROVIDER_SITE_OTHER): Payer: Commercial Managed Care - PPO

## 2023-03-01 ENCOUNTER — Encounter: Payer: Self-pay | Admitting: Podiatry

## 2023-03-01 VITALS — BP 134/81 | HR 77

## 2023-03-01 DIAGNOSIS — M779 Enthesopathy, unspecified: Secondary | ICD-10-CM | POA: Diagnosis not present

## 2023-03-01 DIAGNOSIS — M21619 Bunion of unspecified foot: Secondary | ICD-10-CM

## 2023-03-01 MED ORDER — TRIAMCINOLONE ACETONIDE 10 MG/ML IJ SUSP
10.0000 mg | Freq: Once | INTRAMUSCULAR | Status: AC
  Administered 2023-03-01: 10 mg via INTRA_ARTICULAR

## 2023-03-01 NOTE — Progress Notes (Signed)
Subjective:   Patient ID: Shannon Gould, female   DOB: 47 y.o.   MRN: [id number]   HPI Patient states developed a lot of pain in the Achilles tendon again over the last couple months after walking with flat shoes and I do have structural bunion and I do get some discomfort in my feet in general with family history   ROS      Objective:  Physical Exam  Neuro vascular status intact inflammation pain medial side Achilles at insertion very painful when pressed with flatfoot deformity of a moderate nature and structural bunion right over left     Assessment:  Acute Achilles tendinitis right structural bunion flatfoot deformity     Plan:  H&P reviewed recommended conservative treatment and went ahead today discussed injection explained risk of injection she wants this done I did sterile prep injected the medial side Achilles 3 mg Dexasone Kenalog 5 mg Xylocaine advised on ice and wearing the boot she has at home and casted for functional orthotics to lift the arches up with heel lifts bilateral and discussed bunion correction which may need to be done 1 point in future  X-rays indicate no spurs formed in the heel there is structural bunion significant right with elevation of the intermetatarsal angle of approximately 15 degrees

## 2023-03-19 ENCOUNTER — Other Ambulatory Visit: Payer: Self-pay | Admitting: Physician Assistant

## 2023-03-21 ENCOUNTER — Encounter (HOSPITAL_COMMUNITY): Payer: Self-pay

## 2023-03-21 ENCOUNTER — Other Ambulatory Visit: Payer: Self-pay

## 2023-03-21 ENCOUNTER — Other Ambulatory Visit (HOSPITAL_COMMUNITY): Payer: Self-pay

## 2023-03-21 MED ORDER — ZOLPIDEM TARTRATE ER 12.5 MG PO TBCR
12.5000 mg | EXTENDED_RELEASE_TABLET | Freq: Every evening | ORAL | 5 refills | Status: DC | PRN
  Filled 2023-03-21: qty 30, 30d supply, fill #0

## 2023-03-27 ENCOUNTER — Other Ambulatory Visit (HOSPITAL_COMMUNITY): Payer: Self-pay

## 2023-04-08 ENCOUNTER — Encounter (HOSPITAL_BASED_OUTPATIENT_CLINIC_OR_DEPARTMENT_OTHER): Payer: Self-pay | Admitting: Cardiology

## 2023-04-08 ENCOUNTER — Ambulatory Visit (HOSPITAL_BASED_OUTPATIENT_CLINIC_OR_DEPARTMENT_OTHER): Payer: Commercial Managed Care - PPO | Admitting: Cardiology

## 2023-04-08 VITALS — BP 116/82 | HR 86 | Ht 63.0 in | Wt 213.6 lb

## 2023-04-08 DIAGNOSIS — R002 Palpitations: Secondary | ICD-10-CM

## 2023-04-08 DIAGNOSIS — Z7182 Exercise counseling: Secondary | ICD-10-CM

## 2023-04-08 DIAGNOSIS — E6609 Other obesity due to excess calories: Secondary | ICD-10-CM

## 2023-04-08 DIAGNOSIS — E66812 Obesity, class 2: Secondary | ICD-10-CM | POA: Diagnosis not present

## 2023-04-08 DIAGNOSIS — Z6837 Body mass index (BMI) 37.0-37.9, adult: Secondary | ICD-10-CM

## 2023-04-08 DIAGNOSIS — Z7189 Other specified counseling: Secondary | ICD-10-CM | POA: Diagnosis not present

## 2023-04-08 NOTE — Patient Instructions (Signed)

## 2023-04-08 NOTE — Progress Notes (Signed)
Cardiology Office Note:  .   Date:  04/08/2023  ID:  Shannon Gould, DOB 1976/04/17, MRN [id number] PCP: Shannon Found, MD  Shannon Gould Cardiologist:  Shannon Red, MD {  History of Present Illness: .   Shannon Gould is a 47 y.o. female with a hx of obesity, depression, anxiety who is being seen for follow up. I initially met her 07/29/2018 as a new consult at the request of Shannon Found, MD for the evaluation and management of chest pain/palpitations.   Family history: father has had 2 Mis, has three stents. First was in his late 40s/early 8s. Mother and father, as well as sister and brother, have high blood pressure. Shannon Gould had both MI and stroke.   Today: Struggling with weight regain, though she feels this has stabilized. Sweets are her downfall. Did very well with Shannon Gould but this is no longer covered by Shannon Gould. All of her family members have metabolic syndrome and she wants to avoid this. Cut back caffeine and this has helped with palpitations. Discussed diet and exercise today. Has intermittent LE edema about once a week but has only taken lasix once or twice.    ROS: Denies chest pain, shortness of breath at rest or with normal exertion. No PND, orthopnea, or unexpected weight gain. No syncope or palpitations. ROS otherwise negative except as noted.   Studies Reviewed: Marland Kitchen    EKG:       Physical Exam:   VS:  BP 116/82 (BP Location: Left Arm, Patient Position: Sitting, Cuff Size: Normal)   Pulse 86   Ht 5\' 3"  (1.6 m)   Wt 213 lb 9.6 oz (96.9 kg)   SpO2 100%   BMI 37.84 kg/m    Wt Readings from Last 3 Encounters:  04/08/23 213 lb 9.6 oz (96.9 kg)  10/05/22 194 lb 14.4 oz (88.4 kg)  09/28/22 196 lb (88.9 kg)    GEN: Well nourished, well developed in no acute distress HEENT: Normal, moist mucous membranes NECK: No JVD CARDIAC: regular rhythm, normal S1 and S2, no rubs or gallops. No murmur. VASCULAR: Radial and DP pulses 2+  bilaterally. No carotid bruits RESPIRATORY:  Clear to auscultation without rales, wheezing or rhonchi  ABDOMEN: Soft, non-tender, non-distended MUSCULOSKELETAL:  Ambulates independently SKIN: Warm and dry, no edema NEUROLOGIC:  Alert and oriented x 3. No focal neuro deficits noted. PSYCHIATRIC:  Normal affect    ASSESSMENT AND PLAN: .    Palpitations: -monitor reassuring, single PVCs, overall burden <1%. Reassuring.   Chest discomfort: improved -improved -prior ETT reassuring -reviewed Gould flag warning signs that need immediate medical attention   Obesity, BMI 43->34. Starting weight 250 lbs, nadir 194, now 213 lbs with BMI 37 At risk for metabolic syndrome: aripiprazole increases her risk for metabolic syndrome. Also has strong family history of metabolic syndrome -did well on semaglutide, unfortunately this is no longer covered by [employer] -reviewed diet, exercise recommendations at length. Recommend weight bearing exercise to decrease weight regain   Intermittent LE edema -rarely needs lasix  CV risk counseling and prevention -recommend heart healthy/Mediterranean diet, with whole grains, fruits, vegetable, fish, lean meats, nuts, and olive oil. Limit salt. -recommend moderate walking, 3-5 times/week for 30-50 minutes each session. Aim for at least 150 minutes.week. Goal should be pace of 3 miles/hours, or walking 1.5 miles in 30 minutes -recommend avoidance of tobacco products. Avoid excess alcohol. -ASCVD risk score: The 10-year ASCVD risk score (Shannon Gould, et al., 2019)  is: 0.6%   Values used to calculate the score:     Age: 35 years     Sex: Female     Is Non-Hispanic African American: Yes     Diabetic: No     Tobacco smoker: No     Systolic Blood Pressure: 116 mmHg     Is BP treated: No     HDL Cholesterol: 60 mg/dL     Total Cholesterol: 226 mg/dL    Dispo: 6 months  Signed, Shannon Red, MD   Shannon Red, MD, PhD, [facility]  [facility]  [place]  [facility] at [facility] at [facility] [address], [address] [place], [place] [address] [phone]

## 2023-04-10 ENCOUNTER — Other Ambulatory Visit: Payer: Self-pay | Admitting: Neurology

## 2023-04-10 ENCOUNTER — Other Ambulatory Visit: Payer: Self-pay

## 2023-04-11 ENCOUNTER — Other Ambulatory Visit (HOSPITAL_COMMUNITY): Payer: Self-pay

## 2023-04-17 ENCOUNTER — Other Ambulatory Visit: Payer: Self-pay

## 2023-04-22 ENCOUNTER — Encounter: Payer: Self-pay | Admitting: Podiatry

## 2023-04-22 ENCOUNTER — Ambulatory Visit (INDEPENDENT_AMBULATORY_CARE_PROVIDER_SITE_OTHER): Payer: Commercial Managed Care - PPO | Admitting: Podiatry

## 2023-04-22 DIAGNOSIS — M21619 Bunion of unspecified foot: Secondary | ICD-10-CM

## 2023-04-22 DIAGNOSIS — Z30433 Encounter for removal and reinsertion of intrauterine contraceptive device: Secondary | ICD-10-CM | POA: Diagnosis not present

## 2023-04-22 DIAGNOSIS — Z30430 Encounter for insertion of intrauterine contraceptive device: Secondary | ICD-10-CM | POA: Diagnosis not present

## 2023-04-22 DIAGNOSIS — Z3202 Encounter for pregnancy test, result negative: Secondary | ICD-10-CM | POA: Diagnosis not present

## 2023-04-22 DIAGNOSIS — M7661 Achilles tendinitis, right leg: Secondary | ICD-10-CM

## 2023-04-22 NOTE — Progress Notes (Signed)
Subjective:   Patient ID: Shannon Gould, female   DOB: 47 y.o.   MRN: [id number]   HPI Patient presents stating that she does seem to be getting some better she is also here to pick up orthotics and wanted it checked   ROS      Objective:  Physical Exam  Neuro vascular status intact pain in the posterior aspect of the right heel improved still present upon deep palpation     Assessment:  Acute plantar fasciitis right still present and Achilles tendinitis improved     Plan:  H&P reviewed I have recommended utilization of stretching ice do not recommend further injections and orthotics which were dispensed today to lift up the heel bed.  Reappoint as symptoms indicate

## 2023-04-23 ENCOUNTER — Other Ambulatory Visit (HOSPITAL_COMMUNITY): Payer: Self-pay

## 2023-04-25 ENCOUNTER — Encounter: Payer: Self-pay | Admitting: Neurology

## 2023-04-25 ENCOUNTER — Telehealth: Payer: Self-pay

## 2023-04-25 NOTE — Telephone Encounter (Signed)
Per patient mychart message, I tried to refill my Aimovig in October. I was told that I needed a prior authorization. Just wondering if you were able to complete it? Thank you,  Shannon Gould

## 2023-04-29 DIAGNOSIS — Z23 Encounter for immunization: Secondary | ICD-10-CM | POA: Diagnosis not present

## 2023-05-01 ENCOUNTER — Telehealth: Payer: Self-pay

## 2023-05-01 NOTE — Telephone Encounter (Signed)
PA needed for Trudessa

## 2023-05-07 ENCOUNTER — Other Ambulatory Visit (HOSPITAL_COMMUNITY): Payer: Self-pay

## 2023-05-14 ENCOUNTER — Telehealth: Payer: Self-pay

## 2023-05-14 NOTE — Telephone Encounter (Signed)
Medication Samples have been provided to the patient.  Drug name: Aimovig       Strength: 140 mg        Qty: 1  LOT: [id number]  Exp.Date: 7/25  Dosing instructions: every 28 days  The patient has been instructed regarding the correct time, dose, and frequency of taking this medication, including desired effects and most common side effects.   Leida Lauth 11:39 AM 05/14/2023

## 2023-05-20 ENCOUNTER — Other Ambulatory Visit (HOSPITAL_COMMUNITY): Payer: Self-pay

## 2023-05-20 ENCOUNTER — Telehealth: Payer: Self-pay | Admitting: Pharmacy Technician

## 2023-05-20 NOTE — Telephone Encounter (Signed)
Pharmacy Patient Advocate Encounter   Received notification from Pt Calls Messages that prior authorization for Shannon Gould  is required/requested.   Insurance verification completed.   The patient is insured through [facility] .   Per test claim: PA required; PA submitted to above mentioned insurance via CoverMyMeds Key/confirmation #/EOC [facility] Status is pending

## 2023-05-20 NOTE — Telephone Encounter (Signed)
PA request has been Submitted. New Encounter created for follow up. For additional info see Pharmacy Prior Auth telephone encounter from 05/20/23.

## 2023-05-20 NOTE — Telephone Encounter (Signed)
Pharmacy Patient Advocate Encounter   Received notification from Pt Calls Messages that prior authorization for Aimovig 140MG /ML auto-injectors is due for renewal.   Insurance verification completed.   The patient is insured through [facility] .   Per test claim: PA required; PA submitted to above mentioned insurance via CoverMyMeds Key/confirmation #/EOC [id number] Status is pending   PA Case ID #: [id number]

## 2023-05-20 NOTE — Telephone Encounter (Signed)
PA request has been  started . New Encounter created for follow up. For additional info see Pharmacy Prior Auth telephone encounter from 05/20/23.

## 2023-05-21 ENCOUNTER — Other Ambulatory Visit (HOSPITAL_COMMUNITY): Payer: Self-pay

## 2023-05-22 ENCOUNTER — Other Ambulatory Visit (HOSPITAL_COMMUNITY): Payer: Self-pay

## 2023-05-22 NOTE — Telephone Encounter (Signed)
Pharmacy Patient Advocate Encounter  Received notification from [facility] that Prior Authorization for TRUDHESA 0.725 has been APPROVED from 12.16.24 to 12.15.25. Ran test claim, Copay is $200. This test claim was processed through [facility]- copay amounts may vary at other pharmacies due to pharmacy/plan contracts, or as the patient moves through the different stages of their insurance plan.   PA #/Case ID/Reference #:  [id number]

## 2023-05-23 NOTE — Telephone Encounter (Signed)
Pharmacy Patient Advocate Encounter  Received notification from [facility] that Prior Authorization for Aimovig 140mg  has been APPROVED from 05/22/2023 to 05/20/2024

## 2023-05-30 ENCOUNTER — Other Ambulatory Visit: Payer: Self-pay

## 2023-06-17 ENCOUNTER — Encounter: Payer: Self-pay | Admitting: Physician Assistant

## 2023-06-17 ENCOUNTER — Other Ambulatory Visit: Payer: Self-pay

## 2023-06-17 ENCOUNTER — Ambulatory Visit (INDEPENDENT_AMBULATORY_CARE_PROVIDER_SITE_OTHER): Payer: Commercial Managed Care - PPO | Admitting: Physician Assistant

## 2023-06-17 DIAGNOSIS — R7989 Other specified abnormal findings of blood chemistry: Secondary | ICD-10-CM | POA: Diagnosis not present

## 2023-06-17 DIAGNOSIS — F411 Generalized anxiety disorder: Secondary | ICD-10-CM | POA: Diagnosis not present

## 2023-06-17 DIAGNOSIS — G4700 Insomnia, unspecified: Secondary | ICD-10-CM | POA: Diagnosis not present

## 2023-06-17 DIAGNOSIS — F319 Bipolar disorder, unspecified: Secondary | ICD-10-CM

## 2023-06-17 MED ORDER — VENLAFAXINE HCL ER 150 MG PO CP24
150.0000 mg | ORAL_CAPSULE | Freq: Every day | ORAL | 1 refills | Status: DC
  Filled 2023-06-17 – 2023-07-20 (×2): qty 90, 90d supply, fill #0
  Filled 2023-10-31: qty 90, 90d supply, fill #1

## 2023-06-17 MED ORDER — VENLAFAXINE HCL ER 75 MG PO CP24
75.0000 mg | ORAL_CAPSULE | Freq: Every day | ORAL | 1 refills | Status: DC
  Filled 2023-06-17 – 2023-07-20 (×2): qty 90, 90d supply, fill #0
  Filled 2023-10-31: qty 90, 90d supply, fill #1

## 2023-06-17 MED ORDER — LORAZEPAM 1 MG PO TABS
1.0000 mg | ORAL_TABLET | Freq: Three times a day (TID) | ORAL | 5 refills | Status: DC | PRN
  Filled 2023-06-17: qty 60, 10d supply, fill #0

## 2023-06-17 MED ORDER — ARIPIPRAZOLE 20 MG PO TABS
20.0000 mg | ORAL_TABLET | Freq: Every day | ORAL | 1 refills | Status: DC
  Filled 2023-06-17 – 2023-07-20 (×2): qty 90, 90d supply, fill #0
  Filled 2023-10-15: qty 90, 90d supply, fill #1

## 2023-06-17 MED ORDER — ZOLPIDEM TARTRATE ER 12.5 MG PO TBCR
12.5000 mg | EXTENDED_RELEASE_TABLET | Freq: Every evening | ORAL | 5 refills | Status: DC | PRN
  Filled 2023-06-17: qty 30, 30d supply, fill #0
  Filled 2023-07-20: qty 30, 30d supply, fill #1
  Filled 2023-09-02: qty 30, 30d supply, fill #2
  Filled 2023-10-09: qty 30, 30d supply, fill #3
  Filled 2023-11-20: qty 30, 30d supply, fill #4

## 2023-06-17 MED ORDER — BUSPIRONE HCL 15 MG PO TABS
15.0000 mg | ORAL_TABLET | Freq: Two times a day (BID) | ORAL | 1 refills | Status: DC
  Filled 2023-06-17: qty 180, 90d supply, fill #0
  Filled 2023-09-13: qty 180, 90d supply, fill #1

## 2023-06-17 NOTE — Progress Notes (Signed)
 [facility] Med Check  Patient ID: Shannon Gould,  MRN: [id number]  PCP: Marvine Rush, MD  Date of Evaluation: 06/17/2023 Time spent:30 minutes  Chief Complaint:  Chief Complaint   Depression; Anxiety; Insomnia; Follow-up    HISTORY/CURRENT STATUS: HPI for routine med check.  She is doing really well.  Feels like her medications are working as intended.  She enjoys usual activities, work is going well.  Energy and motivation are good.  ADLs and personal hygiene are normal.  Appetite is normal and weight is stable.  No change in memory, focus, or attention span.  Denies suicidal or homicidal thoughts.  Pat still has anxiety from time to time.  The Ativan  is helpful.  She does not usually have panic attacks but gets overwhelmed sometimes.  The Ambien  is still effective for sleep.  She does not take it every night but when she needs it it is because she has trouble falling asleep and staying asleep.  Patient denies increased energy with decreased need for sleep, increased talkativeness, racing thoughts, impulsivity or risky behaviors, increased spending, increased libido, grandiosity, increased irritability or anger, paranoia, or hallucinations.  Denies dizziness, syncope, seizures, numbness, tingling, tremor, tics, unsteady gait, slurred speech, confusion. Denies muscle or joint pain, stiffness, or dystonia. Denies unexplained weight loss, frequent infections, or sores that heal slowly.  No polyphagia, polydipsia, or polyuria. Denies visual changes or paresthesias.   Individual Medical History/ Review of Systems: Changes? :No      Past medications for mental health diagnoses include: Effexor , Vistaril , Xanax , Ambien , Zoloft , Wellbutrin  which caused increased agitation and suicidal ideations, trazodone, Abilify , Sonata , Valium ( not sure why it was stopped) mirtazapine  was not very effective  Allergies: Benzodiazepines, Hydrocodone, Lexapro [escitalopram], Wellbutrin   [bupropion ], Caffeine, and Lactose intolerance (gi)  Current Medications:  Current Outpatient Medications:    ALBUTEROL  IN, Inhale into the lungs as needed., Disp: , Rfl:    dicyclomine  (BENTYL ) 20 MG tablet, Take 1 tablet (20 mg total) by mouth 2 (two) times daily as needed for spasms., Disp: 20 tablet, Rfl: 0   Erenumab -aooe (AIMOVIG ) 140 MG/ML SOAJ, Inject 140 mg as directed every 28 (twenty-eight) days., Disp: 1 mL, Rfl: 11   furosemide  (LASIX ) 40 MG tablet, Take 40 mg by mouth daily as needed for fluid or edema., Disp: , Rfl:    ipratropium (ATROVENT ) 0.03 % nasal spray, Place 2 sprays into both nostrils every 12 (twelve) hours., Disp: 30 mL, Rfl: 0   levonorgestrel  (MIRENA , 52 MG,) 20 MCG/24HR IUD, 1 each by Intrauterine route once. , Disp: , Rfl:    linaclotide  (LINZESS ) 145 MCG CAPS capsule, Take 1 capsule (145 mcg total) by mouth daily., Disp: 90 capsule, Rfl: 1   pantoprazole  (PROTONIX ) 20 MG tablet, Take 1 tablet (20 mg total) by mouth daily before breakfast., Disp: 90 tablet, Rfl: 0   polyethylene glycol (MIRALAX ) 17 g packet, Take 17 g by mouth daily. (Patient taking differently: Take 17 g by mouth at bedtime.), Disp: 30 each, Rfl: 3   TRUDHESA  0.725 MG/ACT AERS, USE 1 SPRAY IN EACH NOSTRIL AS NEEDED AT ONSET OF MIGRAINE. MAY REPEAT IN 1 HOUR. MAXIMUM 2 DOSES PER DAY. MAXIMUM 3 DOSES WITHIN 7 DAYS., Disp: 8 mL, Rfl: 5   Wheat Dextrin (BENEFIBER) POWD, Use daily as directed, Disp: 529 g, Rfl: 0   ARIPiprazole  (ABILIFY ) 20 MG tablet, Take 1 tablet (20 mg total) by mouth daily., Disp: 90 tablet, Rfl: 1   b complex vitamins tablet, Take 1 tablet by  mouth daily. (Patient not taking: Reported on 06/17/2023), Disp: 30 tablet, Rfl: 11   busPIRone  (BUSPAR ) 15 MG tablet, Take 1 tablet (15 mg total) by mouth 2 (two) times daily., Disp: 180 tablet, Rfl: 1   Cholecalciferol (VITAMIN D ) 125 MCG (5000 UT) CAPS, Take by mouth. (Patient not taking: Reported on 06/17/2023), Disp: , Rfl:    LORazepam   (ATIVAN ) 1 MG tablet, Take 1-2 tablets (1-2 mg total) by mouth every 8 (eight) hours as needed for anxiety., Disp: 60 tablet, Rfl: 5   Omega-3 Fatty Acids (FISH OIL PO), Take 1 capsule by mouth daily. (Patient not taking: Reported on 06/17/2023), Disp: , Rfl:    ondansetron  (ZOFRAN ) 4 MG tablet, Take 1 tablet (4 mg total) by mouth every 6 (six) hours. (Patient not taking: Reported on 06/17/2023), Disp: 12 tablet, Rfl: 0   Semaglutide -Weight Management (WEGOVY ) 2.4 MG/0.75ML SOAJ, Inject 2.4 mg into the skin once a week. (Patient not taking: Reported on 06/17/2023), Disp: 3 mL, Rfl: 5   venlafaxine  XR (EFFEXOR -XR) 150 MG 24 hr capsule, Take 1 capsule (150 mg total) by mouth daily with breakfast., Disp: 90 capsule, Rfl: 1   venlafaxine  XR (EFFEXOR -XR) 75 MG 24 hr capsule, Take 1 capsule (75 mg total) by mouth daily with breakfast. TAKE WITH 150 MG TO EQUAL A DOSE OF 225 MG., Disp: 90 capsule, Rfl: 1   zolpidem  (AMBIEN  CR) 12.5 MG CR tablet, Take 1 tablet (12.5 mg total) by mouth at bedtime as needed for sleep., Disp: 30 tablet, Rfl: 5 Medication Side Effects: none  Family Medical/ Social History: Changes?  No  MENTAL HEALTH EXAM:  There were no vitals taken for this visit.There is no height or weight on file to calculate BMI.  General Appearance: Casual and Well Groomed  Eye Contact:  Good  Speech:  Clear and Coherent and Normal Rate  Volume:  Normal  Mood:  Euthymic  Affect:  Congruent  Thought Process:  Goal Directed and Descriptions of Associations: Circumstantial  Orientation:  Full (Time, Place, and Person)  Thought Content: Logical   Suicidal Thoughts:  No  Homicidal Thoughts:  No  Memory:  WNL  Judgement:  Good  Insight:  Good  Psychomotor Activity:  Normal  Concentration:  Concentration: Good and Attention Span: Good  Recall:  Good  Fund of Knowledge: Good  Language: Good  Assets:  Communication Skills Desire for Improvement Financial  Resources/Insurance Housing Transportation Vocational/Educational  ADL's:  Intact  Cognition: WNL  Prognosis:  Good   Labs 12/14/2022 CBC with differential completely normal CMP completely normal Hemoglobin A1c normal at 5.4 Total cholesterol 226, LDL 155, otherwise normal Vitamin D  39.2 TSH 0.567  DIAGNOSES:    ICD-10-CM   1. Bipolar I disorder (HCC)  F31.9     2. GAD (generalized anxiety disorder)  F41.1     3. Insomnia, unspecified type  G47.00     4. Low vitamin D  level  R79.89       Receiving Psychotherapy: No   RECOMMENDATIONS:  PDMP was reviewed. Last Ambien  filled 03/21/2023.  Last Ativan  filled 12/19/2022. I provided 30 minutes of face to face time during this encounter, including time spent before and after the visit in records review, medical decision making, counseling pertinent to today's visit, and charting.   She is doing very well so no changes need to be made.  Continue Abilify  20 mg, 1 p.o. every morning. Continue BuSpar  15 mg, 1 tablet twice daily for anxiety. Continue Ativan  1 mg, 1-2 p.o.  3 times daily as needed anxiety. Continue Effexor  XR 150 mg +75 mg= 225 mg p.o. daily. Continue Ambien   CR 12.5 mg, 1 p.o. nightly as needed. Encouraged her to get back on a multivitamin, vitamin D , fish oil, and B complex. Return in 6 months.  Verneita Cooks, PA-C

## 2023-06-18 ENCOUNTER — Other Ambulatory Visit (HOSPITAL_COMMUNITY): Payer: Self-pay

## 2023-06-26 ENCOUNTER — Other Ambulatory Visit: Payer: Self-pay

## 2023-06-26 ENCOUNTER — Encounter: Payer: Self-pay | Admitting: Neurology

## 2023-07-22 ENCOUNTER — Other Ambulatory Visit: Payer: Self-pay

## 2023-08-14 ENCOUNTER — Other Ambulatory Visit (HOSPITAL_COMMUNITY): Payer: Self-pay

## 2023-09-02 ENCOUNTER — Other Ambulatory Visit (HOSPITAL_COMMUNITY): Payer: Self-pay

## 2023-09-04 ENCOUNTER — Ambulatory Visit
Admission: RE | Admit: 2023-09-04 | Discharge: 2023-09-04 | Disposition: A | Discharge status: Home / Self Care | Source: Ambulatory Visit | Attending: Nurse Practitioner | Admitting: Nurse Practitioner

## 2023-09-04 VITALS — BP 133/84 | HR 79 | Temp 98.6°F | Resp 18

## 2023-09-04 DIAGNOSIS — J101 Influenza due to other identified influenza virus with other respiratory manifestations: Secondary | ICD-10-CM | POA: Diagnosis not present

## 2023-09-04 LAB — POC COVID19/FLU A&B COMBO
Covid Antigen, POC: NEGATIVE
Influenza A Antigen, POC: POSITIVE — AB
Influenza B Antigen, POC: NEGATIVE

## 2023-09-04 LAB — POCT RAPID STREP A (OFFICE): Rapid Strep A Screen: NEGATIVE

## 2023-09-04 MED ORDER — OSELTAMIVIR PHOSPHATE 75 MG PO CAPS: 75.0000 mg | ORAL_CAPSULE | Freq: Two times a day (BID) | ORAL | 0 refills | Status: DC

## 2023-09-04 MED ORDER — FLUTICASONE PROPIONATE 50 MCG/ACT NA SUSP: 2.0000 | Freq: Every day | NASAL | 0 refills | Status: AC

## 2023-09-04 NOTE — Discharge Instructions (Signed)
 The rapid strep test was negative.  The COVID/flu test was positive for influenza A. Take medication as prescribed. Continue Ibuprofen and Tylenol for pain, fever, or general discomfort. Recommend the use of Pedialyte or Gatorade to prevent dehydration. Recommend using a humidifier in the bedroom at nighttime during sleep and to sleep elevated on pillows while cough symptoms persist. You should remain home until you have been fever-free for 24 hours with no medication.  Please be advised that symptoms should improve over the next 5 to 7 days.  If symptoms appear to be worsening, or if there are other concerns, you may follow-up in this clinic or with your PCP for further evaluation. Follow-up as needed.

## 2023-09-04 NOTE — ED Provider Notes (Addendum)
 [facility]    CSN: [id number] Arrival date & time: 09/04/23  1202      History   Chief Complaint Chief Complaint  Patient presents with   Sore Throat    Entered by patient    HPI Shannon Gould is a 48 y.o. female.   The history is provided by the patient.   Patient presents with a 2-day history of fever, sore throat, cough, and headache.  Tmax 102, last fever was this morning.  She denies ear pain, ear drainage, nasal congestion, wheezing, difficulty breathing, chest pain, abdominal pain, nausea, vomiting, diarrhea, or rash.  Patient states that she has been taking over-the-counter Robitussin for her symptoms.  She denies any obvious known sick contacts.  States that she took an over-the-counter COVID and flu test yesterday which was negative.  Past Medical History:  Diagnosis Date   Asthma    Bipolar 1 disorder (HCC)    Chronic constipation    CIN II (cervical intraepithelial neoplasia II)    GAD (generalized anxiety disorder)    GERD (gastroesophageal reflux disease)    Hiatal hernia    History of iron deficiency anemia    History of palpitations in adulthood    pt evaluated by cardiologist--- dr b. Cristal Deer note in epic 09-06-2021  for atypical chest pain/ palpitations per not pt  had echo approxl 2013 and normal ETT 2020 not further testing and released prn basis   MDD (major depressive disorder)    Migraine without aura and without status migrainosus, not intractable    neurologist--- dr Everlena Cooper   Panic disorder    Urgency of urination    Wears glasses     Patient Active Problem List   Diagnosis Date Noted   Migraines 10/11/2020   GERD (gastroesophageal reflux disease)    Presenile dementia with depressive features (HCC) 04/04/2018   GAD (generalized anxiety disorder) 04/04/2018   Insomnia 04/04/2018   Major depressive disorder, recurrent episode, moderate (HCC) 03/28/2018   Major depressive disorder, recurrent severe without psychotic features  (HCC) 10/31/2016    Past Surgical History:  Procedure Laterality Date   CERVICAL CONIZATION W/BX N/A 01/22/2022   Procedure: CONIZATION CERVIX WITH BIOPSY: COLD KNIFE CONE;  Surgeon: Marlow Baars, MD;  Location: [facility];  Service: Gynecology;  Laterality: N/A;   COLPOSCOPY  01/22/2022   CYSTOSCOPY WITH URETHRAL DILATATION     age 70   ERCP  2006   @[facility]    LAPAROSCOPIC CHOLECYSTECTOMY  2002   @[facility]    REMOVAL OF URINARY SLING  02/17/2018   @[facility] ;   vaginal urethrolysis , partial removal midurethral sling   URETHRAL SLING  08/08/2010   @[facility]  by dr Jerre Simon;    Tension-free vaginal tape    OB History   No obstetric history on file.      Home Medications    Prior to Admission medications   Medication Sig Start Date End Date Taking? Authorizing Provider  fluticasone (FLONASE) 50 MCG/ACT nasal spray Place 2 sprays into both nostrils daily. 09/04/23  Yes Leath-Warren, Sadie Haber, NP  oseltamivir (TAMIFLU) 75 MG capsule Take 1 capsule (75 mg total) by mouth every 12 (twelve) hours. 09/04/23  Yes Leath-Warren, Sadie Haber, NP  ALBUTEROL IN Inhale into the lungs as needed.    [provider]  ARIPiprazole (ABILIFY) 20 MG tablet Take 1 tablet (20 mg total) by mouth daily. 06/17/23   Melony Overly T, PA-C  b complex vitamins tablet Take 1 tablet by mouth daily.  Patient not taking: Reported on 06/17/2023 02/01/20   Melony Overly T, PA-C  busPIRone (BUSPAR) 15 MG tablet Take 1 tablet (15 mg total) by mouth 2 (two) times daily. 06/17/23   Cherie Ouch, PA-C  Cholecalciferol (VITAMIN D) 125 MCG (5000 UT) CAPS Take by mouth. Patient not taking: Reported on 06/17/2023    [provider]  dicyclomine (BENTYL) 20 MG tablet Take 1 tablet (20 mg total) by mouth 2 (two) times daily as needed for spasms. 08/12/22   Peter Garter, PA  Erenumab-aooe (AIMOVIG) 140 MG/ML SOAJ Inject 140 mg as directed every 28 (twenty-eight) days. 09/28/22   Drema Dallas, DO  furosemide  (LASIX) 40 MG tablet Take 40 mg by mouth daily as needed for fluid or edema.    [provider]  ipratropium (ATROVENT) 0.03 % nasal spray Place 2 sprays into both nostrils every 12 (twelve) hours. 06/07/22   Roxy Horseman, PA-C  levonorgestrel (MIRENA, 52 MG,) 20 MCG/24HR IUD 1 each by Intrauterine route once.     [provider]  linaclotide (LINZESS) 145 MCG CAPS capsule Take 1 capsule (145 mcg total) by mouth daily. 07/06/22     LORazepam (ATIVAN) 1 MG tablet Take 1-2 tablets (1-2 mg total) by mouth every 8 (eight) hours as needed for anxiety. 06/17/23   Melony Overly T, PA-C  Omega-3 Fatty Acids (FISH OIL PO) Take 1 capsule by mouth daily. Patient not taking: Reported on 06/17/2023    [provider]  ondansetron (ZOFRAN) 4 MG tablet Take 1 tablet (4 mg total) by mouth every 6 (six) hours. Patient not taking: Reported on 06/17/2023 08/12/22   Sherian Maroon A, PA  pantoprazole (PROTONIX) 20 MG tablet Take 1 tablet (20 mg total) by mouth daily before breakfast. 04/04/22   Doree Albee, PA-C  polyethylene glycol (MIRALAX) 17 g packet Take 17 g by mouth daily. Patient taking differently: Take 17 g by mouth at bedtime. 07/13/21   Doree Albee, PA-C  Semaglutide-Weight Management (WEGOVY) 2.4 MG/0.75ML SOAJ Inject 2.4 mg into the skin once a week. Patient not taking: Reported on 06/17/2023 09/24/22   Jodelle Red, MD  TRUDHESA 0.725 MG/ACT AERS USE 1 SPRAY IN EACH NOSTRIL AS NEEDED AT ONSET OF MIGRAINE. MAY REPEAT IN 1 HOUR. MAXIMUM 2 DOSES PER DAY. MAXIMUM 3 DOSES WITHIN 7 DAYS. 04/11/23   Drema Dallas, DO  venlafaxine XR (EFFEXOR-XR) 150 MG 24 hr capsule Take 1 capsule (150 mg total) by mouth daily with breakfast. 06/17/23   Melony Overly T, PA-C  venlafaxine XR (EFFEXOR-XR) 75 MG 24 hr capsule Take 1 capsule (75 mg total) by mouth daily with breakfast. TAKE WITH 150 MG TO EQUAL A DOSE OF 225 MG. 06/17/23   Hurst, Teresa T, PA-C  Wheat Dextrin (BENEFIBER) POWD  Use daily as directed 07/13/21   Doree Albee, PA-C  zolpidem (AMBIEN CR) 12.5 MG CR tablet Take 1 tablet (12.5 mg total) by mouth at bedtime as needed for sleep. 06/17/23   Cherie Ouch, PA-C    Family History Family History  Problem Relation Age of Onset   Hypertension Mother    Diabetes Mother    Hypertension Father    Heart disease Father    Sleep apnea Father    Diabetes Father    Heart attack Father        x2   CAD Father    Hyperlipidemia Father    Heart disease Sister    Anemia Sister  Hypertension Sister    Heart disease Brother    Hypertension Brother    Asthma Maternal Grandmother    Hypertension Paternal Grandmother    Diabetes Paternal Grandmother    Seizures Paternal Grandmother    Diabetes Paternal Grandfather    Anxiety disorder Daughter    Depression Daughter    Asthma Daughter    Asthma Son    Asthma Son     Social History Social History   Tobacco Use   Smoking status: Never   Smokeless tobacco: Never  Vaping Use   Vaping status: Never Used  Substance Use Topics   Alcohol use: Not Currently    Comment: rare   Drug use: Never     Allergies   Benzodiazepines, Hydrocodone, Lexapro [escitalopram], Wellbutrin [bupropion], Caffeine, and Lactose intolerance (gi)   Review of Systems Review of Systems Per HPI  Physical Exam Triage Vital Signs ED Triage Vitals  Encounter Vitals Group     BP 09/04/23 1252 133/84     Systolic BP Percentile --      Diastolic BP Percentile --      Pulse Rate 09/04/23 1252 79     Resp 09/04/23 1252 18     Temp 09/04/23 1252 98.6 F (37 C)     Temp Source 09/04/23 1252 Oral     SpO2 09/04/23 1252 93 %     Weight --      Height --      Head Circumference --      Peak Flow --      Pain Score 09/04/23 1256 3     Pain Loc --      Pain Education --      Exclude from Growth Chart --    No data found.  Updated Vital Signs BP 133/84 (BP Location: Right Arm)   Pulse 79   Temp 98.6 F (37 C) (Oral)    Resp 18   SpO2 93%   Visual Acuity Right Eye Distance:   Left Eye Distance:   Bilateral Distance:    Right Eye Near:   Left Eye Near:    Bilateral Near:     Physical Exam Vitals and nursing note reviewed.  Constitutional:      General: She is not in acute distress.    Appearance: Normal appearance.  HENT:     Head: Normocephalic.     Right Ear: Tympanic membrane, ear canal and external ear normal.     Left Ear: Tympanic membrane, ear canal and external ear normal.     Nose: Nose normal.     Right Turbinates: Enlarged and swollen.     Left Turbinates: Enlarged and swollen.     Right Sinus: No maxillary sinus tenderness or frontal sinus tenderness.     Left Sinus: No maxillary sinus tenderness or frontal sinus tenderness.     Mouth/Throat:     Lips: Pink.     Mouth: Mucous membranes are moist.     Pharynx: Uvula midline. Posterior oropharyngeal erythema and postnasal drip present. No oropharyngeal exudate or uvula swelling.     Comments: Cobblestoning present to posterior oropharynx  Eyes:     Extraocular Movements: Extraocular movements intact.     Conjunctiva/sclera: Conjunctivae normal.     Pupils: Pupils are equal, round, and reactive to light.  Cardiovascular:     Rate and Rhythm: Normal rate and regular rhythm.     Pulses: Normal pulses.     Heart sounds: Normal heart sounds.  Pulmonary:  Effort: Pulmonary effort is normal. No respiratory distress.     Breath sounds: Normal breath sounds. No stridor. No wheezing, rhonchi or rales.  Abdominal:     General: Bowel sounds are normal.     Palpations: Abdomen is soft.     Tenderness: There is no abdominal tenderness.  Musculoskeletal:     Cervical back: Normal range of motion.  Skin:    General: Skin is warm and dry.  Neurological:     General: No focal deficit present.     Mental Status: She is alert and oriented to person, place, and time.  Psychiatric:        Mood and Affect: Mood normal.        Behavior:  Behavior normal.      UC Treatments / Results  Labs (all labs ordered are listed, but only abnormal results are displayed) Labs Reviewed  POC COVID19/FLU A&B COMBO - Abnormal; Notable for the following components:      Result Value   Influenza A Antigen, POC Positive (*)    All other components within normal limits  POCT RAPID STREP A (OFFICE) - Normal    EKG   Radiology No results found.  Procedures Procedures (including critical care time)  Medications Ordered in UC Medications - No data to display  Initial Impression / Assessment and Plan / UC Course  I have reviewed the triage vital signs and the nursing notes.  Pertinent labs & imaging results that were available during my care of the patient were reviewed by me and considered in my medical decision making (see chart for details).  The rapid strep test was negative. The covid/flu test was positive for influenza A. Start Tamiflu 75mg  daily x 5 days.  Symptomatic treatment provided with fluticasone nasal spray for nasal congestion and runny nose.  Patient to continue Robitussin for cough.  Patient advised to remain home until she has been fever free for 24 hours with no medication.  Supportive care recommendations were discussed and provided to the patient to include continuing over-the-counter Tylenol or ibuprofen for pain, fever, or general discomfort, fluids, rest, and use of humidifier during sleep.  Discussed indications with patient for follow-up.  Patient was in agreement with this plan of care and verbalized understanding.  All questions were answered.  Patient stable for discharge.  Work note was provided.  Final Clinical Impressions(s) / UC Diagnoses   Final diagnoses:  Influenza A     Discharge Instructions      The rapid strep test was negative.  The COVID/flu test was positive for influenza A. Take medication as prescribed. Continue Ibuprofen and Tylenol for pain, fever, or general  discomfort. Recommend the use of Pedialyte or Gatorade to prevent dehydration. Recommend using a humidifier in the bedroom at nighttime during sleep and to sleep elevated on pillows while cough symptoms persist. You should remain home until you have been fever-free for 24 hours with no medication.  Please be advised that symptoms should improve over the next 5 to 7 days.  If symptoms appear to be worsening, or if there are other concerns, you may follow-up in this clinic or with your PCP for further evaluation. Follow-up as needed.      ED Prescriptions     Medication Sig Dispense Auth. Provider   oseltamivir (TAMIFLU) 75 MG capsule Take 1 capsule (75 mg total) by mouth every 12 (twelve) hours. 10 capsule Leath-Warren, Sadie Haber, NP   fluticasone (FLONASE) 50 MCG/ACT nasal spray Place  2 sprays into both nostrils daily. 16 g Leath-Warren, Sadie Haber, NP      PDMP not reviewed this encounter.   Abran Cantor, NP 09/04/23 1439    Abran Cantor, NP 09/04/23 1440

## 2023-09-04 NOTE — ED Triage Notes (Addendum)
 Pt reports sore throat fever cough and headache pt states she tested for COVID and flu yesterday and it was all negative. Sx's x 2 days

## 2023-09-14 ENCOUNTER — Other Ambulatory Visit (HOSPITAL_COMMUNITY): Payer: Self-pay

## 2023-09-17 ENCOUNTER — Encounter (HOSPITAL_BASED_OUTPATIENT_CLINIC_OR_DEPARTMENT_OTHER): Payer: Self-pay

## 2023-09-17 DIAGNOSIS — R002 Palpitations: Secondary | ICD-10-CM

## 2023-09-17 NOTE — Telephone Encounter (Signed)
Addressed via separate encounter.   Alver Sorrow, NP

## 2023-09-17 NOTE — Telephone Encounter (Signed)
 Rhythm strip does show some irregularly concerning for PAC (early beats in the top chamber of the heart) which can trigger more palpitations. The first EKG is a bit difficult to interpret due to artifact (just not a clear picture).   If having more frequent palpitations, can order 14 day ZIO to assess and mail to her home.  If palpitations are intermittent, can defer ZIO and get labs prior to OV (CBC, TSH, mag, BMET)  Clearnce Curia, NP

## 2023-09-30 ENCOUNTER — Ambulatory Visit: Payer: Commercial Managed Care - PPO | Admitting: Neurology

## 2023-09-30 ENCOUNTER — Other Ambulatory Visit (HOSPITAL_COMMUNITY): Payer: Self-pay

## 2023-09-30 DIAGNOSIS — R002 Palpitations: Secondary | ICD-10-CM | POA: Diagnosis not present

## 2023-09-30 MED ORDER — CYCLOBENZAPRINE HCL 10 MG PO TABS
10.0000 mg | ORAL_TABLET | Freq: Three times a day (TID) | ORAL | 1 refills | Status: AC
  Filled 2023-09-30: qty 45, 15d supply, fill #0

## 2023-09-30 MED ORDER — CELECOXIB 200 MG PO CAPS
200.0000 mg | ORAL_CAPSULE | Freq: Two times a day (BID) | ORAL | 1 refills | Status: AC | PRN
  Filled 2023-09-30: qty 60, 30d supply, fill #0

## 2023-10-01 ENCOUNTER — Encounter (HOSPITAL_BASED_OUTPATIENT_CLINIC_OR_DEPARTMENT_OTHER): Payer: Self-pay

## 2023-10-01 LAB — CBC
Hematocrit: 42.6 % (ref 34.0–46.6)
Hemoglobin: 13.7 g/dL (ref 11.1–15.9)
MCH: 27 pg (ref 26.6–33.0)
MCHC: 32.2 g/dL (ref 31.5–35.7)
MCV: 84 fL (ref 79–97)
Platelets: 247 10*3/uL (ref 150–450)
RBC: 5.07 x10E6/uL (ref 3.77–5.28)
RDW: 12.2 % (ref 11.7–15.4)
WBC: 5.6 10*3/uL (ref 3.4–10.8)

## 2023-10-01 LAB — BASIC METABOLIC PANEL WITH GFR
BUN/Creatinine Ratio: 9 (ref 9–23)
BUN: 7 mg/dL (ref 6–24)
CO2: 22 mmol/L (ref 20–29)
Calcium: 9.5 mg/dL (ref 8.7–10.2)
Chloride: 101 mmol/L (ref 96–106)
Creatinine, Ser: 0.78 mg/dL (ref 0.57–1.00)
Glucose: 82 mg/dL (ref 70–99)
Potassium: 4.2 mmol/L (ref 3.5–5.2)
Sodium: 140 mmol/L (ref 134–144)
eGFR: 95 mL/min/{1.73_m2} (ref >59)

## 2023-10-01 LAB — MAGNESIUM: Magnesium: 1.6 mg/dL (ref 1.6–2.3)

## 2023-10-01 LAB — TSH: TSH: 1.66 u[IU]/mL (ref 0.450–4.500)

## 2023-10-07 ENCOUNTER — Other Ambulatory Visit (HOSPITAL_COMMUNITY): Payer: Self-pay

## 2023-10-07 ENCOUNTER — Other Ambulatory Visit: Payer: Self-pay

## 2023-10-07 ENCOUNTER — Ambulatory Visit (HOSPITAL_BASED_OUTPATIENT_CLINIC_OR_DEPARTMENT_OTHER): Payer: Commercial Managed Care - PPO | Admitting: Family

## 2023-10-07 ENCOUNTER — Encounter (HOSPITAL_BASED_OUTPATIENT_CLINIC_OR_DEPARTMENT_OTHER): Payer: Self-pay | Admitting: Family

## 2023-10-07 VITALS — BP 100/80 | HR 92 | Ht 63.0 in | Wt 234.8 lb

## 2023-10-07 DIAGNOSIS — R002 Palpitations: Secondary | ICD-10-CM

## 2023-10-07 DIAGNOSIS — R600 Localized edema: Secondary | ICD-10-CM

## 2023-10-07 DIAGNOSIS — Z6841 Body Mass Index (BMI) 40.0 and over, adult: Secondary | ICD-10-CM | POA: Diagnosis not present

## 2023-10-07 MED ORDER — FUROSEMIDE 40 MG PO TABS
40.0000 mg | ORAL_TABLET | Freq: Every day | ORAL | 2 refills | Status: AC | PRN
  Filled 2023-10-07 (×2): qty 90, 90d supply, fill #0

## 2023-10-07 MED ORDER — PROPRANOLOL HCL 20 MG PO TABS
20.0000 mg | ORAL_TABLET | Freq: Two times a day (BID) | ORAL | 2 refills | Status: AC | PRN
  Filled 2023-10-07 (×2): qty 180, 90d supply, fill #0

## 2023-10-07 NOTE — Patient Instructions (Addendum)
 Medication Instructions:  Restart Propranolol 20 mg twice daily as needed  *If you need a refill on your cardiac medications before your next appointment, please call your pharmacy*  Lab Work: NONE ordered at this time of appointment    Testing/Procedures: NONE ordered at this time of appointment    Follow-Up: At [facility], you and your health needs are our priority.  As part of our continuing mission to provide you with exceptional heart care, our providers are all part of one team.  This team includes your primary Cardiologist (physician) and Advanced Practice Providers or APPs (Physician Assistants and Nurse Practitioners) who all work together to provide you with the care you need, when you need it.  Your next appointment:   1 year(s)  Provider:   Sheryle Donning, MD, Slater Duncan, NP, or Neomi Banks, NP    We recommend signing up for the patient portal called "MyChart".  Sign up information is provided on this After Visit Summary.  MyChart is used to connect with patients for Virtual Visits (Telemedicine).  Patients are able to view lab/test results, encounter notes, upcoming appointments, etc.  Non-urgent messages can be sent to your provider as well.   To learn more about what you can do with MyChart, go to [email or web address].        To prevent or reduce lower extremity swelling: Eat a low salt diet. Salt makes the body hold onto extra fluid which causes swelling. Sit with legs elevated. For example, in the recliner or on an ottoman.  Wear knee-high compression stockings during the daytime. Ones labeled 15-20 mmHg provide good compression.

## 2023-10-07 NOTE — Progress Notes (Signed)
 Cardiology Office Note:  .   Date:  10/07/2023  ID:  Shannon Gould, DOB June 28, 1975, MRN [id number] PCP: Minus Amel, MD  [facility] Providers Cardiologist:  Sheryle Donning, MD    History of Present Illness: .   Shannon Gould is a 48 y.o. female with history of obesity, depression, anxiety, palpitations.  Family history notable for coronary artery disease in her father and hypertension in her mother, father, sister, brother.  Paternal grandmother with both MI and stroke.  Prior ETT 08/07/2018 with no evidence of ischemia.  Previously had successful weight loss on Wegovy  but had discontinue as it was no longer covered by her insurance.  Prior monitor 10/2022 with single PVC overall burden less than 1% and no significant arrhythmias.  ED visit/2/25 for respiratory symptoms positive for influenza A.  Given Tamiflu .  She contacted the office 09/17/2023 noting palpitations.  Cardiac rhythm strip with PAC.  Obesity, TSH, NP, magnesium  levels unremarkable.  Presents today for follow-up independently.  Exercising regularly using a walking pad.  Reports palpitations occur for a few days each month.  Most recent episode after drinking a cold icy.  She has recently started Zepbound  and is hopeful weight will continue to decrease. Rare utilization of Lasix for LE edema. Reports no shortness of breath nor dyspnea on exertion. Reports brief episode of chest pain with palpitations which self resolved. No chest pain, pressure, or tightness with physical activity such as walking on her walking pad. No  orthopnea, PND.   ROS: Please see the history of present illness.    All other systems reviewed and are negative.   Studies Reviewed: Aaron Aas   EKG Interpretation Date/Time:  Monday Oct 07 2023 09:08:35 EDT Ventricular Rate:  92 PR Interval:  128 QRS Duration:  88 QT Interval:  362 QTC Calculation: 447 R Axis:   54  Text Interpretation: Normal sinus rhythm Normal ECG Confirmed by Neomi Banks ([id number]) on 10/07/2023 9:09:43 AM    Cardiac Studies & Procedures   ______________________________________________________________________________________________   STRESS TESTS  EXERCISE TOLERANCE TEST (ETT) 08/07/2018  Narrative  Blood pressure demonstrated a normal response to exercise.  There was no ST segment deviation noted during stress.  ETT with normal exercise tolerance (6:29); No CP; normal BP response; no ST changes; negative adequate ETT; Duke treadmill score 6.      MONITORS  LONG TERM MONITOR (3-14 DAYS) 09/28/2022  Narrative Patch Wear Time:  6 days and 23 hours (2024-04-15T22:37:22-0400 to 2024-04-22T22:05:08-398)  Patient had a min HR of 36 bpm, max HR of 135 bpm, and avg HR of 85 bpm. Predominant underlying rhythm was Sinus Rhythm. No VT, SVT, atrial fibrillation, high degree block, or pauses noted. Isolated atrial and ventricular ectopy was rare (<1%). There were 19 triggered events. These were sinus with single PVC. No high risk arrhythmias detected.       ______________________________________________________________________________________________      Risk Assessment/Calculations:             Physical Exam:   VS:  BP 100/80 (BP Location: Left Arm, Patient Position: Sitting, Cuff Size: Large)   Pulse 92   Ht 5\' 3"  (1.6 m)   Wt 234 lb 12.8 oz (106.5 kg)   SpO2 94%   BMI 41.59 kg/m    Wt Readings from Last 3 Encounters:  10/07/23 234 lb 12.8 oz (106.5 kg)  04/08/23 213 lb 9.6 oz (96.9 kg)  10/05/22 194 lb 14.4 oz (88.4 kg)    GEN: Well nourished,  well developed in no acute distress NECK: No JVD; No carotid bruits CARDIAC: RRR, no murmurs, rubs, gallops RESPIRATORY:  Clear to auscultation without rales, wheezing or rhonchi  ABDOMEN: Soft, non-tender, non-distended EXTREMITIES:  No edema; No deformity   ASSESSMENT AND PLAN: .    LE edema - none on exam. Managed with Lasix 40 mg as needed.  Refill provided.  Recommend low-sodium diet,  leg elevation.  Palpitations-recent Kardia mobile with brief PAC.  Prior monitor 10/2022 with no significant arrhythmia.  Episodes occur monthly lasting 2 to 3 days.  Rx propranolol 20 mg as needed up to twice daily for palpitations.  If they increase in frequency could consider repeat monitor.  Recent lab work 09/30/23 unremarkable CBC, TSH, magnesium , BMP.  Recommend limiting caffeine, alcohol, managing stress well, staying well-hydrated to prevent palpitations.  Obesity-recently started Zepbound .  Plans to self-pay.  Encouraged to continue walking and walking pad.  Discussed right start exercise program through [facility].       Dispo: follow up in 1 year  Signed, Clearnce Curia, NP

## 2023-10-08 ENCOUNTER — Other Ambulatory Visit (HOSPITAL_COMMUNITY): Payer: Self-pay

## 2023-10-08 ENCOUNTER — Other Ambulatory Visit: Payer: Self-pay

## 2023-10-08 MED ORDER — PANTOPRAZOLE SODIUM 20 MG PO TBEC
20.0000 mg | DELAYED_RELEASE_TABLET | Freq: Every day | ORAL | 2 refills | Status: AC | PRN
  Filled 2023-10-08: qty 90, 90d supply, fill #0

## 2023-10-09 ENCOUNTER — Other Ambulatory Visit: Payer: Self-pay

## 2023-10-09 ENCOUNTER — Other Ambulatory Visit (HOSPITAL_COMMUNITY): Payer: Self-pay

## 2023-10-11 ENCOUNTER — Other Ambulatory Visit (HOSPITAL_COMMUNITY): Payer: Self-pay

## 2023-10-11 DIAGNOSIS — Z113 Encounter for screening for infections with a predominantly sexual mode of transmission: Secondary | ICD-10-CM | POA: Diagnosis not present

## 2023-10-11 DIAGNOSIS — Z1231 Encounter for screening mammogram for malignant neoplasm of breast: Secondary | ICD-10-CM | POA: Diagnosis not present

## 2023-10-11 DIAGNOSIS — Z8742 Personal history of other diseases of the female genital tract: Secondary | ICD-10-CM | POA: Diagnosis not present

## 2023-10-11 DIAGNOSIS — R87810 Cervical high risk human papillomavirus (HPV) DNA test positive: Secondary | ICD-10-CM | POA: Diagnosis not present

## 2023-10-11 DIAGNOSIS — N898 Other specified noninflammatory disorders of vagina: Secondary | ICD-10-CM | POA: Diagnosis not present

## 2023-10-11 DIAGNOSIS — Z01419 Encounter for gynecological examination (general) (routine) without abnormal findings: Secondary | ICD-10-CM | POA: Diagnosis not present

## 2023-10-12 ENCOUNTER — Ambulatory Visit
Admission: RE | Admit: 2023-10-12 | Discharge: 2023-10-12 | Disposition: A | Discharge status: Home / Self Care | Source: Ambulatory Visit | Attending: Family Medicine | Admitting: Family Medicine

## 2023-10-12 ENCOUNTER — Encounter: Admitting: Nurse Practitioner

## 2023-10-12 ENCOUNTER — Ambulatory Visit (HOSPITAL_COMMUNITY)

## 2023-10-12 VITALS — BP 115/75 | HR 89 | Temp 97.9°F | Resp 18 | Ht 63.0 in | Wt 233.0 lb

## 2023-10-12 DIAGNOSIS — R197 Diarrhea, unspecified: Secondary | ICD-10-CM

## 2023-10-12 DIAGNOSIS — T50905A Adverse effect of unspecified drugs, medicaments and biological substances, initial encounter: Secondary | ICD-10-CM

## 2023-10-12 DIAGNOSIS — K521 Toxic gastroenteritis and colitis: Secondary | ICD-10-CM

## 2023-10-12 NOTE — Progress Notes (Signed)
 I have spent 5 minutes in review of e-visit questionnaire, review and updating patient chart, medical decision making and response to patient.   Claiborne Rigg, NP

## 2023-10-12 NOTE — Progress Notes (Signed)
 I recommend you stop zepbound  and follow up with your PCP for a face to face visit.  Please contact your primary care physician practice to be seen. Many offices offer virtual options to be seen via video if you are not comfortable going in person to a medical facility at this time.  NOTE: You will NOT be charged for this eVisit.  If you do not have a PCP, [facility] offers a free physician referral service available at [phone]. Our trained staff has the experience, knowledge and resources to put you in touch with a physician who is right for you.    If you are having a true medical emergency please call 911.   Your e-visit answers were reviewed by a board certified advanced clinical practitioner to complete your personal care plan.  Thank you for using e-Visits.

## 2023-10-12 NOTE — ED Provider Notes (Signed)
 [facility] C[facility]    CSN: [id number] Arrival date & time: 10/12/23  1204      History   Chief Complaint Chief Complaint  Patient presents with   Diarrhea    Entered by patient    HPI Shannon Gould is a 48 y.o. female.   HPI  Patient was started on Zepbound  injections from her primary care provider.  She had 1 injection 10 days ago.  Has had unremitting diarrhea ever since that time.  She states that she took an Imodium on Friday which gave her control overnight, today, however, she has increased diarrhea that is foul-smelling.  She inquires whether she could have C. difficile.  She has not been on recent antibiotics.  She has not had any travel.  I informed her that I think it is still a reaction from the Zepbound .  These are long-acting injections that stay in your system for weeks.  Past Medical History:  Diagnosis Date   Asthma    Bipolar 1 disorder (HCC)    Chronic constipation    CIN II (cervical intraepithelial neoplasia II)    GAD (generalized anxiety disorder)    GERD (gastroesophageal reflux disease)    Hiatal hernia    History of iron deficiency anemia    History of palpitations in adulthood    pt evaluated by cardiologist--- dr b. Veryl Gottron note in epic 09-06-2021  for atypical chest pain/ palpitations per not pt  had echo approxl 2013 and normal ETT 2020 not further testing and released prn basis   MDD (major depressive disorder)    Migraine without aura and without status migrainosus, not intractable    neurologist--- dr Festus Hubert   Panic disorder    Urgency of urination    Wears glasses     Patient Active Problem List   Diagnosis Date Noted   Migraines 10/11/2020   GERD (gastroesophageal reflux disease)    Presenile dementia with depressive features (HCC) 04/04/2018   GAD (generalized anxiety disorder) 04/04/2018   Insomnia 04/04/2018   Major depressive disorder, recurrent episode, moderate (HCC) 03/28/2018   Major depressive disorder,  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/31/2016    Past Surgical History:  Procedure Laterality Date   CERVICAL CONIZATION W/BX N/A 01/22/2022   Procedure: CONIZATION CERVIX WITH BIOPSY: COLD KNIFE CONE;  Surgeon: Luan Rumpf, MD;  Location: [facility];  Service: Gynecology;  Laterality: N/A;   COLPOSCOPY  01/22/2022   CYSTOSCOPY WITH URETHRAL DILATATION     age 37   ERCP  2006   @[facility]    LAPAROSCOPIC CHOLECYSTECTOMY  2002   @[facility]    REMOVAL OF URINARY SLING  02/17/2018   @[facility] ;   vaginal urethrolysis , partial removal midurethral sling   URETHRAL SLING  08/08/2010   @[facility]  by dr Rudolm Coss;    Tension-free vaginal tape    OB History   No obstetric history on file.      Home Medications    Prior to Admission medications   Medication Sig Start Date End Date Taking? Authorizing Provider  ALBUTEROL  IN Inhale into the lungs as needed.   Yes [provider]  ARIPiprazole  (ABILIFY ) 20 MG tablet Take 1 tablet (20 mg total) by mouth daily. 06/17/23  Yes Marvia Slocumb T, PA-C  b complex vitamins tablet Take 1 tablet by mouth daily. 02/01/20  Yes Hurst, Teresa T, PA-C  busPIRone  (BUSPAR ) 15 MG tablet Take 1 tablet (15 mg total) by mouth 2 (two) times daily. 06/17/23  Yes Hurst,  Rogene Claude, PA-C  celecoxib  (CELEBREX ) 200 MG capsule Take 1 capsule (200 mg total) by mouth 2 (two) times daily as needed. 09/30/23  Yes   Cholecalciferol (VITAMIN D ) 125 MCG (5000 UT) CAPS Take by mouth.   Yes [provider]  cyclobenzaprine  (FLEXERIL ) 10 MG tablet Take 1 tablet (10 mg total) by mouth 3 (three) times daily. 09/30/23  Yes   Erenumab -aooe (AIMOVIG ) 140 MG/ML SOAJ Inject 140 mg as directed every 28 (twenty-eight) days. 09/28/22  Yes Jaffe, Adam R, DO  fluticasone  (FLONASE ) 50 MCG/ACT nasal spray Place 2 sprays into both nostrils daily. 09/04/23  Yes Leath-Warren, Belen Bowers, NP  furosemide  (LASIX ) 40 MG tablet Take 1 tablet (40 mg total) by mouth daily as needed for  fluid or edema. 10/07/23  Yes Walker, Caitlin S, NP  ipratropium (ATROVENT ) 0.03 % nasal spray Place 2 sprays into both nostrils every 12 (twelve) hours. 06/07/22  Yes Sherel Dikes, PA-C  levonorgestrel (MIRENA, 52 MG,) 20 MCG/24HR IUD 1 each by Intrauterine route once.    Yes [provider]  linaclotide  (LINZESS ) 145 MCG CAPS capsule Take 1 capsule (145 mcg total) by mouth daily. 07/06/22  Yes   LORazepam  (ATIVAN ) 1 MG tablet Take 1-2 tablets (1-2 mg total) by mouth every 8 (eight) hours as needed for anxiety. 06/17/23  Yes Hurst, Ammon Bales T, PA-C  Omega-3 Fatty Acids (FISH OIL PO) Take 1 capsule by mouth daily.   Yes [provider]  pantoprazole  (PROTONIX ) 20 MG tablet Take 1 tablet (20 mg total) by mouth daily before breakfast. 04/04/22  Yes Santina Cull R, PA-C  pantoprazole  (PROTONIX ) 20 MG tablet Take 1 tablet (20 mg total) by mouth daily as needed. 10/08/23  Yes   polyethylene glycol (MIRALAX ) 17 g packet Take 17 g by mouth daily. Patient taking differently: Take 17 g by mouth at bedtime. 07/13/21  Yes Santina Cull R, PA-C  propranolol  (INDERAL ) 20 MG tablet Take 1 tablet (20 mg total) by mouth 2 (two) times daily as needed. 10/07/23  Yes Clearnce Curia, NP  tirzepatide  (ZEPBOUND ) 5 MG/0.5ML Pen Inject 5 mg into the skin once a week.   Yes [provider]  TRUDHESA  0.725 MG/ACT AERS USE 1 SPRAY IN EACH NOSTRIL AS NEEDED AT ONSET OF MIGRAINE. MAY REPEAT IN 1 HOUR. MAXIMUM 2 DOSES PER DAY. MAXIMUM 3 DOSES WITHIN 7 DAYS. 04/11/23  Yes Festus Hubert, Adam R, DO  venlafaxine  XR (EFFEXOR -XR) 150 MG 24 hr capsule Take 1 capsule (150 mg total) by mouth daily with breakfast. 06/17/23  Yes Hurst, Teresa T, PA-C  venlafaxine  XR (EFFEXOR -XR) 75 MG 24 hr capsule Take 1 capsule (75 mg total) by mouth daily with breakfast. TAKE WITH 150 MG TO EQUAL A DOSE OF 225 MG. 06/17/23  Yes Hurst, Teresa T, PA-C  zolpidem  (AMBIEN  CR) 12.5 MG CR tablet Take 1 tablet (12.5 mg total) by mouth at bedtime as  needed for sleep. 06/17/23  Yes Verneda Golder, PA-C    Family History Family History  Problem Relation Age of Onset   Hypertension Mother    Diabetes Mother    Hypertension Father    Heart disease Father    Sleep apnea Father    Diabetes Father    Heart attack Father        x2   CAD Father    Hyperlipidemia Father    Heart disease Sister    Anemia Sister    Hypertension Sister    Heart disease Brother  Hypertension Brother    Asthma Maternal Grandmother    Hypertension Paternal Grandmother    Diabetes Paternal Grandmother    Seizures Paternal Grandmother    Diabetes Paternal Grandfather    Anxiety disorder Daughter    Depression Daughter    Asthma Daughter    Asthma Son    Asthma Son     Social History Social History   Tobacco Use   Smoking status: Never   Smokeless tobacco: Never  Vaping Use   Vaping status: Never Used  Substance Use Topics   Alcohol use: Not Currently    Comment: rare   Drug use: Never     Allergies   Benzodiazepines, Hydrocodone, Lexapro [escitalopram], Wellbutrin  [bupropion ], Caffeine, and Lactose intolerance (gi)   Review of Systems Review of Systems See HPI  Physical Exam Triage Vital Signs ED Triage Vitals  Encounter Vitals Group     BP 10/12/23 1219 115/75     Systolic BP Percentile --      Diastolic BP Percentile --      Pulse Rate 10/12/23 1219 89     Resp 10/12/23 1219 18     Temp 10/12/23 1219 97.9 F (36.6 C)     Temp Source 10/12/23 1219 Oral     SpO2 10/12/23 1219 99 %     Weight 10/12/23 1220 233 lb (105.7 kg)     Height 10/12/23 1220 5\' 3"  (1.6 m)     Head Circumference --      Peak Flow --      Pain Score 10/12/23 1220 0     Pain Loc --      Pain Education --      Exclude from Growth Chart --    No data found.  Updated Vital Signs BP 115/75 (BP Location: Right Arm)   Pulse 89   Temp 97.9 F (36.6 C) (Oral)   Resp 18   Ht 5\' 3"  (1.6 m)   Wt 105.7 kg   SpO2 99%   BMI 41.27 kg/m       Physical Exam Constitutional:      General: She is not in acute distress.    Appearance: She is well-developed. She is obese. She is not ill-appearing.  HENT:     Head: Normocephalic and atraumatic.     Mouth/Throat:     Mouth: Mucous membranes are moist.  Eyes:     Conjunctiva/sclera: Conjunctivae normal.     Pupils: Pupils are equal, round, and reactive to light.  Cardiovascular:     Rate and Rhythm: Normal rate and regular rhythm.     Heart sounds: Normal heart sounds.  Pulmonary:     Effort: Pulmonary effort is normal. No respiratory distress.     Breath sounds: Normal breath sounds.  Abdominal:     General: Bowel sounds are normal. There is no distension.     Palpations: Abdomen is soft.     Tenderness: There is no abdominal tenderness.     Comments: Bowel sounds are active  Musculoskeletal:        General: Normal range of motion.     Cervical back: Normal range of motion.  Skin:    General: Skin is warm and dry.  Neurological:     Mental Status: She is alert.      UC Treatments / Results  Labs (all labs ordered are listed, but only abnormal results are displayed) Labs Reviewed - No data to display  EKG   Radiology No results found.  Procedures Procedures (including critical care time)  Medications Ordered in UC Medications - No data to display  Initial Impression / Assessment and Plan / UC Course  I have reviewed the triage vital signs and the nursing notes.  Pertinent labs & imaging results that were available during my care of the patient were reviewed by me and considered in my medical decision making (see chart for details).     Final Clinical Impressions(s) / UC Diagnoses   Final diagnoses:  Medication side effect, initial encounter  Diarrhea, unspecified type   Discharge Instructions      Make sure you are drinking plenty of liquids and do not become dehydrated Discontinue.  The Zepbound  May take Imodium daily Call your primary care  on Monday  ED Prescriptions   None    PDMP not reviewed this encounter.   Stephany Ehrich, MD 10/12/23 [phone]

## 2023-10-12 NOTE — ED Triage Notes (Signed)
 Patient states that she started Zepbound  0.5mg  x 10 days ago and has had diarrhea.  Patient was previously on Wegovy  and had constipation which she used Linzess  while taken.  Patient did take Immodium on Thursday.

## 2023-10-12 NOTE — Discharge Instructions (Signed)
 Make sure you are drinking plenty of liquids and do not become dehydrated Discontinue.  The Zepbound  May take Imodium daily Call your primary care on Monday

## 2023-10-15 ENCOUNTER — Other Ambulatory Visit: Payer: Self-pay | Admitting: Neurology

## 2023-10-15 ENCOUNTER — Other Ambulatory Visit (HOSPITAL_COMMUNITY): Payer: Self-pay

## 2023-10-15 ENCOUNTER — Other Ambulatory Visit: Payer: Self-pay

## 2023-10-17 ENCOUNTER — Other Ambulatory Visit (HOSPITAL_COMMUNITY): Payer: Self-pay

## 2023-10-17 ENCOUNTER — Other Ambulatory Visit: Payer: Self-pay

## 2023-10-17 MED ORDER — AIMOVIG 140 MG/ML ~~LOC~~ SOAJ
140.0000 mg | SUBCUTANEOUS | 0 refills | Status: DC
  Filled 2023-10-17: qty 1, 28d supply, fill #0

## 2023-10-21 NOTE — Progress Notes (Signed)
 NEUROLOGY FOLLOW UP OFFICE NOTE  Shannon Gould [id number]  Assessment/Plan:   Migraine without aura, without status migrainosus, not intractable   1.  Migraine prevention:  Aimovig  140mg   2.  Migraine rescue:  Instead of Trudhesa  (which may be more difficult to get approved), will have her try samples of Ubrelvy.  Her insurance will require her to try Ubrelvy before Nurtec.  If ineffective, will prescribe Nurtec 3.  Limit use of pain relievers to no more than 9 days out of the month to prevent risk of rebound or medication-overuse headache.. 4.  Keep headache diary 5.  Follow up one year    Subjective:  Shannon Gould is a 48 year old left-handed female with generalized anxiety disorder and major depression who follows up for migraines.   UPDATE:  Tried samples of Nurtec for abortive therapy.  Effective  Intensity:  Mild Duration:  1 hour with Nurtec (samples).  Not used Trudhesa  in awhile.   Frequency:  Usually once a month Current NSAIDS:  Ibuprofen  600mg , naproxen  Current analgesics: none Current triptans: none Current ergotamine: Trudhesa  NS (has not used) Current anti-emetic:  none Current muscle relaxants: None Current anti-anxiolytic: Xanax  Current sleep aide: Ambien  Current Antihypertensive medications: None Current Antidepressant medications: Venlafaxine  XL 225 mg daily, mirtapamine PRN QHS, Abilify  Current Anticonvulsant medications: none Current anti-CGRP: Aimovig  140mg , Nurtec PRN (samples) Current Vitamins/Herbal/Supplements: Vitamin D  Current Antihistamines/Decongestants: None Other therapy: None Hormone/birth control: Mirena   Caffeine:  no Alcohol:  occasional Smoker:  no Diet:  hydrates Exercise:  no Depression:  yes; Anxiety:  Yes.  She sees a Veterinary surgeon for therapy. Sleep hygiene:  Insomnia.  Uses sonata  and weighted blanket.   HISTORY:  Onset:  20s Location:  Usually left sided Quality:  pounding Initial Intensity:  6/10.  She denies new  headache, thunderclap headache or severe headache that wakes her from sleep. Aura:  no Prodrome:  no Postdrome:  no Associated symptoms:  Nausea, photophobia, phonophobia, blurred vision, left eye droops, osmophobia.  She denies associated vomiting or unilateral numbness or weakness. Initial Duration:  1 day Initial Frequency:  Once a month Initial Frequency of abortive medication: once or twice a month Triggers:  Caffeine Relieving factors:  Laying down Activity:  Aggravates She also reported near daily dull bifrontal headaches which she treats with ibuprofen  600mg .   CT of head from 01/25/2015 was personally reviewed and revealed no acute intracranial abnormalities.   Past NSAIDS: Ibuprofen , naproxen  Past analgesics:  Tylenol , Excedrin, Ultracet Past abortive triptans: Maxalt 5 mg, Sumatriptan tablet (caused nausea) Past abortive ergotamine: none Past muscle relaxants:  none Past anti-emetic:  none Past antihypertensive medications:  none Past antidepressant medications: Wellbutrin , Celexa, Zoloft  100 mg Past anticonvulsant medications:  Topiramate  ER (cognitive problems) Past anti-CGRP:  none Past vitamins/Herbal/Supplements:  none Past antihistamines/decongestants:  none Other past therapies:  none  PAST MEDICAL HISTORY: Past Medical History:  Diagnosis Date   Asthma    Bipolar 1 disorder (HCC)    Chronic constipation    CIN II (cervical intraepithelial neoplasia II)    GAD (generalized anxiety disorder)    GERD (gastroesophageal reflux disease)    Hiatal hernia    History of iron deficiency anemia    History of palpitations in adulthood    pt evaluated by cardiologist--- dr b. Veryl Gottron note in epic 09-06-2021  for atypical chest pain/ palpitations per not pt  had echo approxl 2013 and normal ETT 2020 not further testing and released prn basis   MDD (major  depressive disorder)    Migraine without aura and without status migrainosus, not intractable    neurologist---  dr Festus Hubert   Panic disorder    Urgency of urination    Wears glasses     MEDICATIONS: Current Outpatient Medications on File Prior to Visit  Medication Sig Dispense Refill   ALBUTEROL  IN Inhale into the lungs as needed.     ARIPiprazole  (ABILIFY ) 20 MG tablet Take 1 tablet (20 mg total) by mouth daily. 90 tablet 1   b complex vitamins tablet Take 1 tablet by mouth daily. 30 tablet 11   busPIRone  (BUSPAR ) 15 MG tablet Take 1 tablet (15 mg total) by mouth 2 (two) times daily. 180 tablet 1   celecoxib  (CELEBREX ) 200 MG capsule Take 1 capsule (200 mg total) by mouth 2 (two) times daily as needed. 60 capsule 1   Cholecalciferol (VITAMIN D ) 125 MCG (5000 UT) CAPS Take by mouth.     cyclobenzaprine  (FLEXERIL ) 10 MG tablet Take 1 tablet (10 mg total) by mouth 3 (three) times daily. 45 tablet 1   Erenumab -aooe (AIMOVIG ) 140 MG/ML SOAJ Inject 140 mg as directed every 28 (twenty-eight) days. 1 mL 0   fluticasone  (FLONASE ) 50 MCG/ACT nasal spray Place 2 sprays into both nostrils daily. 16 g 0   furosemide  (LASIX ) 40 MG tablet Take 1 tablet (40 mg total) by mouth daily as needed for fluid or edema. 90 tablet 2   ipratropium (ATROVENT ) 0.03 % nasal spray Place 2 sprays into both nostrils every 12 (twelve) hours. 30 mL 0   levonorgestrel (MIRENA, 52 MG,) 20 MCG/24HR IUD 1 each by Intrauterine route once.      linaclotide  (LINZESS ) 145 MCG CAPS capsule Take 1 capsule (145 mcg total) by mouth daily. 90 capsule 1   LORazepam  (ATIVAN ) 1 MG tablet Take 1-2 tablets (1-2 mg total) by mouth every 8 (eight) hours as needed for anxiety. 60 tablet 5   Omega-3 Fatty Acids (FISH OIL PO) Take 1 capsule by mouth daily.     pantoprazole  (PROTONIX ) 20 MG tablet Take 1 tablet (20 mg total) by mouth daily before breakfast. 90 tablet 0   pantoprazole  (PROTONIX ) 20 MG tablet Take 1 tablet (20 mg total) by mouth daily as needed. 90 tablet 2   polyethylene glycol (MIRALAX ) 17 g packet Take 17 g by mouth daily. (Patient taking  differently: Take 17 g by mouth at bedtime.) 30 each 3   propranolol  (INDERAL ) 20 MG tablet Take 1 tablet (20 mg total) by mouth 2 (two) times daily as needed. 180 tablet 2   tirzepatide  (ZEPBOUND ) 5 MG/0.5ML Pen Inject 5 mg into the skin once a week.     TRUDHESA  0.725 MG/ACT AERS USE 1 SPRAY IN EACH NOSTRIL AS NEEDED AT ONSET OF MIGRAINE. MAY REPEAT IN 1 HOUR. MAXIMUM 2 DOSES PER DAY. MAXIMUM 3 DOSES WITHIN 7 DAYS. 8 mL 5   venlafaxine  XR (EFFEXOR -XR) 150 MG 24 hr capsule Take 1 capsule (150 mg total) by mouth daily with breakfast. 90 capsule 1   venlafaxine  XR (EFFEXOR -XR) 75 MG 24 hr capsule Take 1 capsule (75 mg total) by mouth daily with breakfast. TAKE WITH 150 MG TO EQUAL A DOSE OF 225 MG. 90 capsule 1   zolpidem  (AMBIEN  CR) 12.5 MG CR tablet Take 1 tablet (12.5 mg total) by mouth at bedtime as needed for sleep. 30 tablet 5   No current facility-administered medications on file prior to visit.    ALLERGIES: Allergies  Allergen Reactions  Benzodiazepines    Hydrocodone Itching   Lexapro [Escitalopram] Other (See Comments)    'Knot in throat w/ trouble swallowing.'   Wellbutrin  [Bupropion ]     "Suicidal thoughts"   Caffeine Other (See Comments)    Headache    Lactose Intolerance (Gi) Other (See Comments)    gas    FAMILY HISTORY: Family History  Problem Relation Age of Onset   Hypertension Mother    Diabetes Mother    Hypertension Father    Heart disease Father    Sleep apnea Father    Diabetes Father    Heart attack Father        x2   CAD Father    Hyperlipidemia Father    Heart disease Sister    Anemia Sister    Hypertension Sister    Heart disease Brother    Hypertension Brother    Asthma Maternal Grandmother    Hypertension Paternal Grandmother    Diabetes Paternal Grandmother    Seizures Paternal Grandmother    Diabetes Paternal Grandfather    Anxiety disorder Daughter    Depression Daughter    Asthma Daughter    Asthma Son    Asthma Son        Objective:  Blood pressure 120/75, pulse 80, height 5\' 3"  (1.6 m), weight 238 lb (108 kg), SpO2 98%.. General: No acute distress.  Patient appears well-groomed.   Head:  Normocephalic/atraumatic.  Mild suboccipital tenderness Neck:  Supple.  No paraspinal tenderness.  Full range of motion. Heart:  Regular rate and rhythm. Neuro:  alert and oriented.  Speech fluent and not dysarthric, language intact.  CN II-XII intact. Bulk and tone normal, muscle strength 5/5 throughout.  Sensation to light touch intact.  Deep tendon reflexes 2+ throughout, toes downgoing.  Finger to nose testing intact.  Gait normal, Romberg negative.    Janne Members, DO  CC: Minus Amel, MD

## 2023-10-22 ENCOUNTER — Encounter: Payer: Self-pay | Admitting: Neurology

## 2023-10-22 ENCOUNTER — Ambulatory Visit: Payer: Commercial Managed Care - PPO | Admitting: Neurology

## 2023-10-22 VITALS — BP 120/75 | HR 80 | Ht 63.0 in | Wt 238.0 lb

## 2023-10-22 DIAGNOSIS — G43009 Migraine without aura, not intractable, without status migrainosus: Secondary | ICD-10-CM | POA: Diagnosis not present

## 2023-10-22 NOTE — Patient Instructions (Signed)
 Continue Aimovig  For acute treatment, try Florette Hurry - take at earliest onset . May repeat after 2 hours.  Maximum 2 in 24 hours.  Let me know if it works. If not, then will send prescription in for Nurtec Limit use of pain relievers to no more than 9 days out of the month to prevent risk of rebound or medication-overuse headache. Keep headache diary Follow up 1 year

## 2023-11-01 ENCOUNTER — Other Ambulatory Visit: Payer: Self-pay

## 2023-11-20 ENCOUNTER — Other Ambulatory Visit (HOSPITAL_COMMUNITY): Payer: Self-pay

## 2023-11-20 ENCOUNTER — Other Ambulatory Visit: Payer: Self-pay

## 2023-12-05 ENCOUNTER — Other Ambulatory Visit: Payer: Self-pay | Admitting: Neurology

## 2023-12-09 ENCOUNTER — Ambulatory Visit (INDEPENDENT_AMBULATORY_CARE_PROVIDER_SITE_OTHER): Payer: Commercial Managed Care - PPO | Admitting: Physician Assistant

## 2023-12-09 ENCOUNTER — Other Ambulatory Visit: Payer: Self-pay

## 2023-12-09 ENCOUNTER — Other Ambulatory Visit (HOSPITAL_COMMUNITY): Payer: Self-pay

## 2023-12-09 ENCOUNTER — Encounter: Payer: Self-pay | Admitting: Physician Assistant

## 2023-12-09 DIAGNOSIS — G4700 Insomnia, unspecified: Secondary | ICD-10-CM | POA: Diagnosis not present

## 2023-12-09 DIAGNOSIS — F319 Bipolar disorder, unspecified: Secondary | ICD-10-CM

## 2023-12-09 DIAGNOSIS — F411 Generalized anxiety disorder: Secondary | ICD-10-CM

## 2023-12-09 MED ORDER — VENLAFAXINE HCL ER 150 MG PO CP24
150.0000 mg | ORAL_CAPSULE | Freq: Every day | ORAL | 1 refills | Status: AC
  Filled 2023-12-09 – 2024-01-28 (×3): qty 90, 90d supply, fill #0
  Filled 2024-04-22: qty 90, 90d supply, fill #1

## 2023-12-09 MED ORDER — ZOLPIDEM TARTRATE ER 12.5 MG PO TBCR
12.5000 mg | EXTENDED_RELEASE_TABLET | Freq: Every evening | ORAL | 5 refills | Status: AC | PRN
  Filled 2023-12-09 – 2024-01-02 (×2): qty 30, 30d supply, fill #0
  Filled 2024-02-12: qty 30, 30d supply, fill #1
  Filled 2024-02-20 – 2024-03-30 (×2): qty 30, 30d supply, fill #2
  Filled 2024-05-07: qty 30, 30d supply, fill #3

## 2023-12-09 MED ORDER — BUSPIRONE HCL 15 MG PO TABS
15.0000 mg | ORAL_TABLET | Freq: Two times a day (BID) | ORAL | 1 refills | Status: AC
  Filled 2023-12-09: qty 180, 90d supply, fill #0
  Filled 2024-04-22: qty 180, 90d supply, fill #1

## 2023-12-09 MED ORDER — VENLAFAXINE HCL ER 75 MG PO CP24
75.0000 mg | ORAL_CAPSULE | Freq: Every day | ORAL | 1 refills | Status: AC
  Filled 2023-12-09 – 2024-01-28 (×3): qty 90, 90d supply, fill #0
  Filled 2024-04-22: qty 90, 90d supply, fill #1

## 2023-12-09 MED ORDER — AIMOVIG 140 MG/ML ~~LOC~~ SOAJ
140.0000 mg | SUBCUTANEOUS | 5 refills | Status: AC
  Filled 2023-12-09: qty 1, 28d supply, fill #0
  Filled 2024-01-02: qty 1, 28d supply, fill #1
  Filled 2024-02-04: qty 1, 28d supply, fill #2
  Filled 2024-04-22: qty 1, 28d supply, fill #3
  Filled 2024-07-02: qty 1, 28d supply, fill #4

## 2023-12-09 MED ORDER — ARIPIPRAZOLE 20 MG PO TABS
20.0000 mg | ORAL_TABLET | Freq: Every day | ORAL | 1 refills | Status: AC
  Filled 2023-12-09 – 2024-01-28 (×3): qty 90, 90d supply, fill #0
  Filled 2024-04-23: qty 90, 90d supply, fill #1

## 2023-12-09 NOTE — Progress Notes (Signed)
 [facility]  Patient ID: Shannon Gould,  MRN: [id number]  PCP: Shannon Rush, MD  Date of Evaluation: 12/09/2023 Time spent:20 minutes  Chief Complaint:  Chief Complaint   Anxiety; Depression; Insomnia; Follow-up    HISTORY/CURRENT STATUS: HPI for routine med check.  Shannon Gould is doing well.  Her mood is good for the most part.  About once a week she experiences anxiety, she can be sitting and relaxing and the anxiety will hit out of nowhere.  She will sometimes need to take an Ativan  so it does not become a full-blown panic attack.  The Ativan  is helpful.  She is able to enjoy things.  Work is going well.  Energy and motivation are good.  Appetite is normal and weight is stable.  ADLs and personal hygiene are normal.  She sleeps well as long as she takes the Ambien .  When she does not take it she has nightmares or does not feel rested when she gets up the next morning.  No SI/HI.  She sometimes has racing thoughts when she gets more anxious but otherwise no manic symptoms.  Denies increased energy with decreased need for sleep, increased talkativeness, impulsivity or risky behaviors, increased spending, increased libido, grandiosity, increased irritability or anger, paranoia, or hallucinations.  Denies dizziness, syncope, seizures, numbness, tingling, tremor, tics, unsteady gait, slurred speech, confusion. Denies muscle or joint pain, stiffness, or dystonia. Denies unexplained weight loss, frequent infections, or sores that heal slowly.  No polyphagia, polydipsia, or polyuria. Denies visual changes or paresthesias.   Individual Medical History/ Review of Systems: Changes? :No      Past medications for mental health diagnoses include: Effexor , Vistaril , Xanax , Ambien , Zoloft , Wellbutrin  which caused increased agitation and suicidal ideations, trazodone, Abilify , Sonata , Valium ( not sure why it was stopped) mirtazapine  was not very effective  Allergies: Benzodiazepines,  Hydrocodone, Lexapro [escitalopram], Wellbutrin  [bupropion ], Caffeine, and Lactose intolerance (gi)  Current Medications:  Current Outpatient Medications:    ALBUTEROL  IN, Inhale into the lungs as needed., Disp: , Rfl:    b complex vitamins tablet, Take 1 tablet by mouth daily., Disp: 30 tablet, Rfl: 11   celecoxib  (CELEBREX ) 200 MG capsule, Take 1 capsule (200 mg total) by mouth 2 (two) times daily as needed., Disp: 60 capsule, Rfl: 1   Cholecalciferol (VITAMIN D ) 125 MCG (5000 UT) CAPS, Take by mouth., Disp: , Rfl:    cyclobenzaprine  (FLEXERIL ) 10 MG tablet, Take 1 tablet (10 mg total) by mouth 3 (three) times daily., Disp: 45 tablet, Rfl: 1   Erenumab -aooe (AIMOVIG ) 140 MG/ML SOAJ, Inject 140 mg as directed every 28 (twenty-eight) days., Disp: 1 mL, Rfl: 5   fluticasone  (FLONASE ) 50 MCG/ACT nasal spray, Place 2 sprays into both nostrils daily., Disp: 16 g, Rfl: 0   furosemide  (LASIX ) 40 MG tablet, Take 1 tablet (40 mg total) by mouth daily as needed for fluid or edema., Disp: 90 tablet, Rfl: 2   ipratropium (ATROVENT ) 0.03 % nasal spray, Place 2 sprays into both nostrils every 12 (twelve) hours., Disp: 30 mL, Rfl: 0   levonorgestrel (MIRENA, 52 MG,) 20 MCG/24HR IUD, 1 each by Intrauterine route once. , Disp: , Rfl:    linaclotide  (LINZESS ) 145 MCG CAPS capsule, Take 1 capsule (145 mcg total) by mouth daily., Disp: 90 capsule, Rfl: 1   LORazepam  (ATIVAN ) 1 MG tablet, Take 1-2 tablets (1-2 mg total) by mouth every 8 (eight) hours as needed for anxiety., Disp: 60 tablet, Rfl: 5   Omega-3 Fatty Acids (FISH  OIL PO), Take 1 capsule by mouth daily., Disp: , Rfl:    pantoprazole  (PROTONIX ) 20 MG tablet, Take 1 tablet (20 mg total) by mouth daily before breakfast., Disp: 90 tablet, Rfl: 0   pantoprazole  (PROTONIX ) 20 MG tablet, Take 1 tablet (20 mg total) by mouth daily as needed., Disp: 90 tablet, Rfl: 2   propranolol  (INDERAL ) 20 MG tablet, Take 1 tablet (20 mg total) by mouth 2 (two) times daily as  needed., Disp: 180 tablet, Rfl: 2   ARIPiprazole  (ABILIFY ) 20 MG tablet, Take 1 tablet (20 mg total) by mouth daily., Disp: 90 tablet, Rfl: 1   busPIRone  (BUSPAR ) 15 MG tablet, Take 1 tablet (15 mg total) by mouth 2 (two) times daily., Disp: 180 tablet, Rfl: 1   polyethylene glycol (MIRALAX ) 17 g packet, Take 17 g by mouth daily. (Patient taking differently: Take 17 g by mouth at bedtime.), Disp: 30 each, Rfl: 3   venlafaxine  XR (EFFEXOR -XR) 150 MG 24 hr capsule, Take 1 capsule (150 mg total) by mouth daily with breakfast., Disp: 90 capsule, Rfl: 1   venlafaxine  XR (EFFEXOR -XR) 75 MG 24 hr capsule, Take 1 capsule (75 mg total) by mouth daily with breakfast. TAKE WITH 150 MG TO EQUAL A DOSE OF 225 MG., Disp: 90 capsule, Rfl: 1   zolpidem  (AMBIEN  CR) 12.5 MG CR tablet, Take 1 tablet (12.5 mg total) by mouth at bedtime as needed for sleep., Disp: 30 tablet, Rfl: 5 Medication Side Effects: none  Family Medical/ Social History: Changes?  No  MENTAL HEALTH EXAM:  There were no vitals taken for this visit.There is no height or weight on file to calculate BMI.  General Appearance: Casual and Well Groomed  Eye Contact:  Good  Speech:  Clear and Coherent and Normal Rate  Volume:  Normal  Mood:  Euthymic  Affect:  Congruent  Thought Process:  Goal Directed and Descriptions of Associations: Circumstantial  Orientation:  Full (Time, Place, and Person)  Thought Content: Logical   Suicidal Thoughts:  No  Homicidal Thoughts:  No  Memory:  WNL  Judgement:  Good  Insight:  Good  Psychomotor Activity:  Normal  Concentration:  Concentration: Good and Attention Span: Good  Recall:  Good  Fund of Knowledge: Good  Language: Good  Assets:  Communication Skills Desire for Improvement Financial Resources/Insurance Housing Resilience Transportation Vocational/Educational  ADL's:  Intact  Cognition: WNL  Prognosis:  Good   Labs 09/30/2023 CBC with differential completely normal CMP completely  normal TSH 1.6  PCP follows labs.  She will have a lipid profile ordered with her next labs sometime within the next 6 months and will get those results to me.  DIAGNOSES:    ICD-10-CM   1. Bipolar I disorder (HCC)  F31.9     2. GAD (generalized anxiety disorder)  F41.1     3. Insomnia, unspecified type  G47.00       Receiving Psychotherapy: No   RECOMMENDATIONS:  PDMP was reviewed. Last Ambien  filled 11/20/2023.SABRA  Last Ativan  filled 12/19/2022. I provided approximately 20 minutes of face to face time during this encounter, including time spent before and after the visit in records review, medical decision making, counseling pertinent to today's visit, and charting.   She is doing well so no changes need to be made.  We discussed the nightmares.  Since they do not occur when she takes the Ambien , I recommend she take it every night.  If the nightmares worsen however I may need to  consider prazosin or doxazosin.  Continue Abilify  20 mg, 1 p.o. every morning. Continue BuSpar  15 mg, 1 tablet twice daily for anxiety. Continue Ativan  1 mg, 1-2 p.o. 3 times daily as needed anxiety. Continue Effexor  XR 150 mg +75 mg= 225 mg p.o. daily. Continue Ambien   CR 12.5 mg, 1 p.o. nightly as needed. Encouraged her to get back on a multivitamin, vitamin D , fish oil, and B complex. Return in 6 months.  Verneita Cooks, PA-C

## 2023-12-10 ENCOUNTER — Other Ambulatory Visit: Payer: Self-pay

## 2023-12-10 ENCOUNTER — Other Ambulatory Visit (HOSPITAL_COMMUNITY): Payer: Self-pay

## 2023-12-13 ENCOUNTER — Other Ambulatory Visit (HOSPITAL_COMMUNITY): Payer: Self-pay

## 2023-12-13 ENCOUNTER — Other Ambulatory Visit: Payer: Self-pay

## 2023-12-13 MED ORDER — VALACYCLOVIR HCL 1 G PO TABS
1000.0000 mg | ORAL_TABLET | Freq: Two times a day (BID) | ORAL | 0 refills | Status: AC
  Filled 2023-12-13: qty 14, 7d supply, fill #0

## 2024-01-02 ENCOUNTER — Other Ambulatory Visit: Payer: Self-pay

## 2024-01-02 ENCOUNTER — Other Ambulatory Visit (HOSPITAL_COMMUNITY): Payer: Self-pay

## 2024-01-28 ENCOUNTER — Other Ambulatory Visit (HOSPITAL_BASED_OUTPATIENT_CLINIC_OR_DEPARTMENT_OTHER): Payer: Self-pay

## 2024-01-29 ENCOUNTER — Other Ambulatory Visit (HOSPITAL_BASED_OUTPATIENT_CLINIC_OR_DEPARTMENT_OTHER): Payer: Self-pay

## 2024-02-04 ENCOUNTER — Other Ambulatory Visit (HOSPITAL_BASED_OUTPATIENT_CLINIC_OR_DEPARTMENT_OTHER): Payer: Self-pay

## 2024-02-12 ENCOUNTER — Other Ambulatory Visit (HOSPITAL_BASED_OUTPATIENT_CLINIC_OR_DEPARTMENT_OTHER): Payer: Self-pay

## 2024-02-12 MED ORDER — LINACLOTIDE 145 MCG PO CAPS
145.0000 ug | ORAL_CAPSULE | Freq: Every day | ORAL | 1 refills | Status: AC
  Filled 2024-02-12 (×2): qty 90, 90d supply, fill #0

## 2024-02-13 ENCOUNTER — Other Ambulatory Visit (HOSPITAL_BASED_OUTPATIENT_CLINIC_OR_DEPARTMENT_OTHER): Payer: Self-pay

## 2024-02-14 ENCOUNTER — Other Ambulatory Visit: Payer: Self-pay | Admitting: Neurology

## 2024-02-14 ENCOUNTER — Telehealth: Payer: Self-pay | Admitting: Pharmacy Technician

## 2024-02-14 ENCOUNTER — Telehealth (HOSPITAL_COMMUNITY): Payer: Self-pay

## 2024-02-14 ENCOUNTER — Telehealth: Payer: Self-pay

## 2024-02-14 ENCOUNTER — Encounter: Payer: Self-pay | Admitting: Neurology

## 2024-02-14 ENCOUNTER — Other Ambulatory Visit (HOSPITAL_COMMUNITY): Payer: Self-pay

## 2024-02-14 MED ORDER — NURTEC 75 MG PO TBDP
1.0000 | ORAL_TABLET | Freq: Every day | ORAL | 5 refills | Status: AC | PRN
  Filled 2024-02-14: qty 16, 30d supply, fill #0
  Filled 2024-03-23: qty 8, 30d supply, fill #0
  Filled 2024-06-13: qty 8, 30d supply, fill #1

## 2024-02-14 NOTE — Telephone Encounter (Signed)
 Pharmacy Patient Advocate Encounter   Received notification from Pt Calls Messages that prior authorization for NURTEC 75MG  is required/requested.   Insurance verification completed.   The patient is insured through [facility] .   Per test claim: PA required; PA submitted to above mentioned insurance via Latent Key/confirmation #/EOC [id number] Status is pending

## 2024-02-18 ENCOUNTER — Other Ambulatory Visit (HOSPITAL_COMMUNITY): Payer: Self-pay

## 2024-02-18 NOTE — Telephone Encounter (Signed)
 Pharmacy Patient Advocate Encounter  Received notification from [employer] that Prior Authorization for NURTEC 75MG  has been APPROVED from 9.13.25 to 3.12.26. Ran test claim, Copay is $0. This test claim was processed through [facility]- copay amounts may vary at other pharmacies due to pharmacy/plan contracts, or as the patient moves through the different stages of their insurance plan.   PA #/Case ID/Reference #: [id number]

## 2024-02-20 ENCOUNTER — Other Ambulatory Visit (HOSPITAL_BASED_OUTPATIENT_CLINIC_OR_DEPARTMENT_OTHER): Payer: Self-pay

## 2024-03-06 ENCOUNTER — Telehealth: Payer: Self-pay | Admitting: Cardiology

## 2024-03-06 NOTE — Telephone Encounter (Signed)
 Message received from Larraine Neysa sally Golda with [employer] stated their office received a fax of results for the patient but it was on a spreadsheet with another separate patient. [employer] is requesting it be resent separately, and including the DOB. Please advise   Fax: [phone] Phone: [phone]  Called number listed in phone note and multiple options so reached out to patient  She is not familiar with [employer] so will not send any information and see if they reach out again

## 2024-03-23 ENCOUNTER — Other Ambulatory Visit (HOSPITAL_BASED_OUTPATIENT_CLINIC_OR_DEPARTMENT_OTHER): Payer: Self-pay

## 2024-03-23 MED ORDER — FLUZONE 0.5 ML IM SUSY
0.5000 mL | PREFILLED_SYRINGE | Freq: Once | INTRAMUSCULAR | 0 refills | Status: AC
  Filled 2024-03-23: qty 0.5, 1d supply, fill #0

## 2024-03-24 ENCOUNTER — Other Ambulatory Visit (HOSPITAL_BASED_OUTPATIENT_CLINIC_OR_DEPARTMENT_OTHER): Payer: Self-pay

## 2024-03-25 ENCOUNTER — Other Ambulatory Visit (HOSPITAL_BASED_OUTPATIENT_CLINIC_OR_DEPARTMENT_OTHER): Payer: Self-pay

## 2024-03-30 ENCOUNTER — Other Ambulatory Visit (HOSPITAL_BASED_OUTPATIENT_CLINIC_OR_DEPARTMENT_OTHER): Payer: Self-pay

## 2024-03-30 DIAGNOSIS — G43109 Migraine with aura, not intractable, without status migrainosus: Secondary | ICD-10-CM | POA: Diagnosis not present

## 2024-03-30 DIAGNOSIS — Z13220 Encounter for screening for lipoid disorders: Secondary | ICD-10-CM | POA: Diagnosis not present

## 2024-03-30 DIAGNOSIS — D509 Iron deficiency anemia, unspecified: Secondary | ICD-10-CM | POA: Diagnosis not present

## 2024-03-30 DIAGNOSIS — Z0000 Encounter for general adult medical examination without abnormal findings: Secondary | ICD-10-CM | POA: Diagnosis not present

## 2024-03-30 DIAGNOSIS — E559 Vitamin D deficiency, unspecified: Secondary | ICD-10-CM | POA: Diagnosis not present

## 2024-03-30 DIAGNOSIS — Z6841 Body Mass Index (BMI) 40.0 and over, adult: Secondary | ICD-10-CM | POA: Diagnosis not present

## 2024-04-01 ENCOUNTER — Other Ambulatory Visit (HOSPITAL_BASED_OUTPATIENT_CLINIC_OR_DEPARTMENT_OTHER): Payer: Self-pay

## 2024-04-01 MED ORDER — ROSUVASTATIN CALCIUM 5 MG PO TABS
5.0000 mg | ORAL_TABLET | Freq: Every day | ORAL | 3 refills | Status: AC
  Filled 2024-04-01: qty 90, 90d supply, fill #0

## 2024-04-06 NOTE — Telephone Encounter (Signed)
 Shannon Gould

## 2024-04-09 ENCOUNTER — Ambulatory Visit (INDEPENDENT_AMBULATORY_CARE_PROVIDER_SITE_OTHER)

## 2024-04-09 ENCOUNTER — Encounter: Payer: Self-pay | Admitting: Podiatry

## 2024-04-09 ENCOUNTER — Ambulatory Visit: Admitting: Podiatry

## 2024-04-09 ENCOUNTER — Ambulatory Visit
Admission: RE | Admit: 2024-04-09 | Discharge: 2024-04-09 | Disposition: A | Discharge status: Home / Self Care | Attending: Family Medicine | Admitting: Family Medicine

## 2024-04-09 ENCOUNTER — Other Ambulatory Visit (HOSPITAL_BASED_OUTPATIENT_CLINIC_OR_DEPARTMENT_OTHER): Payer: Self-pay

## 2024-04-09 ENCOUNTER — Telehealth: Admitting: Physician Assistant

## 2024-04-09 VITALS — BP 143/83 | HR 91 | Temp 98.5°F | Resp 20

## 2024-04-09 DIAGNOSIS — M7752 Other enthesopathy of left foot: Secondary | ICD-10-CM

## 2024-04-09 DIAGNOSIS — M21612 Bunion of left foot: Secondary | ICD-10-CM | POA: Diagnosis not present

## 2024-04-09 DIAGNOSIS — M21619 Bunion of unspecified foot: Secondary | ICD-10-CM

## 2024-04-09 DIAGNOSIS — J069 Acute upper respiratory infection, unspecified: Secondary | ICD-10-CM

## 2024-04-09 DIAGNOSIS — M21611 Bunion of right foot: Secondary | ICD-10-CM | POA: Diagnosis not present

## 2024-04-09 DIAGNOSIS — M7751 Other enthesopathy of right foot: Secondary | ICD-10-CM | POA: Diagnosis not present

## 2024-04-09 MED ORDER — BENZONATATE 100 MG PO CAPS
100.0000 mg | ORAL_CAPSULE | Freq: Three times a day (TID) | ORAL | 0 refills | Status: AC | PRN
  Filled 2024-04-09: qty 21, 7d supply, fill #0

## 2024-04-09 MED ORDER — AZELASTINE HCL 0.1 % NA SOLN
1.0000 | Freq: Two times a day (BID) | NASAL | 0 refills | Status: AC
  Filled 2024-04-09: qty 30, 30d supply, fill #0

## 2024-04-09 MED ORDER — PROMETHAZINE-DM 6.25-15 MG/5ML PO SYRP
5.0000 mL | ORAL_SOLUTION | Freq: Four times a day (QID) | ORAL | 0 refills | Status: AC | PRN
  Filled 2024-04-09: qty 100, 5d supply, fill #0

## 2024-04-09 MED ORDER — DICLOFENAC SODIUM 75 MG PO TBEC
75.0000 mg | DELAYED_RELEASE_TABLET | Freq: Two times a day (BID) | ORAL | 2 refills | Status: AC
Start: 2024-04-09 — End: ?
  Filled 2024-04-09: qty 50, 25d supply, fill #0

## 2024-04-09 MED ORDER — TRIAMCINOLONE ACETONIDE 10 MG/ML IJ SUSP
10.0000 mg | Freq: Once | INTRAMUSCULAR | Status: AC
  Administered 2024-04-09: 10 mg via INTRA_ARTICULAR

## 2024-04-09 NOTE — ED Provider Notes (Signed)
 [facility]    CSN: [id number] Arrival date & time: 04/09/24  1209      History   Chief Complaint Chief Complaint  Patient presents with   Cough    CongestionSore throat - Entered by patient    HPI Shannon Gould is a 48 y.o. female.   Patient presenting today with 3-day history of congestion, headache, sore throat, low-grade fever, cough.  Denies chest pain, shortness of breath, abdominal pain, vomiting, diarrhea.  So far trying over-the-counter cold and congestion medication with minimal relief.    Past Medical History:  Diagnosis Date   Asthma    Bipolar 1 disorder (HCC)    Chronic constipation    CIN II (cervical intraepithelial neoplasia II)    GAD (generalized anxiety disorder)    GERD (gastroesophageal reflux disease)    Hiatal hernia    History of iron deficiency anemia    History of palpitations in adulthood    pt evaluated by cardiologist--- dr b. lonni note in epic 09-06-2021  for atypical chest pain/ palpitations per not pt  had echo approxl 2013 and normal ETT 2020 not further testing and released prn basis   MDD (major depressive disorder)    Migraine without aura and without status migrainosus, not intractable    neurologist--- dr skeet   Panic disorder    Urgency of urination    Wears glasses     Patient Active Problem List   Diagnosis Date Noted   Migraines 10/11/2020   GERD (gastroesophageal reflux disease)    Presenile dementia with depressive features (HCC) 04/04/2018   GAD (generalized anxiety disorder) 04/04/2018   Insomnia 04/04/2018   Major depressive disorder, recurrent episode, moderate (HCC) 03/28/2018   Major depressive disorder, 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/31/2016    Past Surgical History:  Procedure Laterality Date   CERVICAL CONIZATION W/BX N/A 01/22/2022   Procedure: CONIZATION CERVIX WITH BIOPSY: COLD KNIFE CONE;  Surgeon: Gretta Gums, MD;  Location: [facility];   Service: Gynecology;  Laterality: N/A;   COLPOSCOPY  01/22/2022   CYSTOSCOPY WITH URETHRAL DILATATION     age 28   ERCP  2006   @[facility]    LAPAROSCOPIC CHOLECYSTECTOMY  2002   @[facility]    REMOVAL OF URINARY SLING  02/17/2018   @[facility] ;   vaginal urethrolysis , partial removal midurethral sling   URETHRAL SLING  08/08/2010   @[facility]  by dr sharleen;    Tension-free vaginal tape    OB History   No obstetric history on file.      Home Medications    Prior to Admission medications   Medication Sig Start Date End Date Taking? Authorizing Provider  azelastine (ASTELIN) 0.1 % nasal spray Place 1 spray into both nostrils 2 (two) times daily. Use in each nostril as directed 04/09/24  Yes Stuart Vernell Norris, PA-C  promethazine -dextromethorphan (PROMETHAZINE -DM) 6.25-15 MG/5ML syrup Take 5 mLs by mouth 4 (four) times daily as needed. 04/09/24  Yes Stuart Vernell Norris, PA-C  ALBUTEROL  IN Inhale into the lungs as needed.    [provider]  ARIPiprazole  (ABILIFY ) 20 MG tablet Take 1 tablet (20 mg total) by mouth daily. 12/09/23   Rhys Boyer T, PA-C  b complex vitamins tablet Take 1 tablet by mouth daily. 02/01/20   Rhys Boyer DASEN, PA-C  benzonatate  (TESSALON ) 100 MG capsule Take 1 capsule (100 mg total) by mouth 3 (three) times daily as needed for up to 7 days for cough. 04/09/24 04/16/24  Zohra,  Mobeen, PA-C  busPIRone  (BUSPAR ) 15 MG tablet Take 1 tablet (15 mg total) by mouth 2 (two) times daily. 12/09/23   Rhys Verneita DASEN, PA-C  celecoxib  (CELEBREX ) 200 MG capsule Take 1 capsule (200 mg total) by mouth 2 (two) times daily as needed. 09/30/23     Cholecalciferol (VITAMIN D ) 125 MCG (5000 UT) CAPS Take by mouth.    [provider]  cyclobenzaprine  (FLEXERIL ) 10 MG tablet Take 1 tablet (10 mg total) by mouth 3 (three) times daily. 09/30/23     diclofenac  (VOLTAREN ) 75 MG EC tablet Take 1 tablet (75 mg total) by mouth 2 (two) times daily. 04/09/24   Magdalen Pasco RAMAN, DPM  Erenumab -aooe  (AIMOVIG ) 140 MG/ML SOAJ Inject 140 mg as directed every 28 (twenty-eight) days. 12/09/23   Skeet Juliene SAUNDERS, DO  fluticasone  (FLONASE ) 50 MCG/ACT nasal spray Place 2 sprays into both nostrils daily. 09/04/23   Leath-Warren, Etta PARAS, NP  furosemide  (LASIX ) 40 MG tablet Take 1 tablet (40 mg total) by mouth daily as needed for fluid or edema. 10/07/23   Walker, Caitlin S, NP  ipratropium (ATROVENT ) 0.03 % nasal spray Place 2 sprays into both nostrils every 12 (twelve) hours. 06/07/22   Vicky Charleston, PA-C  levonorgestrel (MIRENA, 52 MG,) 20 MCG/24HR IUD 1 each by Intrauterine route once.     [provider]  linaclotide  (LINZESS ) 145 MCG CAPS capsule Take 1 capsule (145 mcg total) by mouth daily. 02/12/24     LORazepam  (ATIVAN ) 1 MG tablet Take 1-2 tablets (1-2 mg total) by mouth every 8 (eight) hours as needed for anxiety. 06/17/23   Rhys Verneita T, PA-C  Omega-3 Fatty Acids (FISH OIL PO) Take 1 capsule by mouth daily.    [provider]  pantoprazole  (PROTONIX ) 20 MG tablet Take 1 tablet (20 mg total) by mouth daily before breakfast. 04/04/22   Craig Alan SAUNDERS, PA-C  pantoprazole  (PROTONIX ) 20 MG tablet Take 1 tablet (20 mg total) by mouth daily as needed. 10/08/23     propranolol  (INDERAL ) 20 MG tablet Take 1 tablet (20 mg total) by mouth 2 (two) times daily as needed. 10/07/23   Walker, Caitlin S, NP  Rimegepant Sulfate  (NURTEC) 75 MG TBDP Take 1 tablet (75 mg total) by mouth daily as needed. 02/14/24   Skeet Juliene SAUNDERS, DO  rosuvastatin (CRESTOR) 5 MG tablet Take 1 tablet (5 mg total) by mouth daily. 04/01/24     valACYclovir  (VALTREX ) 1000 MG tablet Take 1 tablet (1,000 mg total) by mouth every 12 (twelve) hours for 7 days. 12/13/23     venlafaxine  XR (EFFEXOR -XR) 150 MG 24 hr capsule Take 1 capsule (150 mg total) by mouth daily with breakfast. 12/09/23   Rhys Verneita T, PA-C  venlafaxine  XR (EFFEXOR -XR) 75 MG 24 hr capsule Take 1 capsule (75 mg total) by mouth daily with breakfast. TAKE WITH  150 MG TO EQUAL A DOSE OF 225 MG. 12/09/23   Hurst, Teresa T, PA-C  zolpidem  (AMBIEN  CR) 12.5 MG CR tablet Take 1 tablet (12.5 mg total) by mouth at bedtime as needed for sleep. 12/09/23   Rhys Verneita DASEN, PA-C    Family History Family History  Problem Relation Age of Onset   Hypertension Mother    Diabetes Mother    Hypertension Father    Heart disease Father    Sleep apnea Father    Diabetes Father    Heart attack Father        x2   CAD Father  Hyperlipidemia Father    Heart disease Sister    Anemia Sister    Hypertension Sister    Heart disease Brother    Hypertension Brother    Asthma Maternal Grandmother    Hypertension Paternal Grandmother    Diabetes Paternal Grandmother    Seizures Paternal Grandmother    Diabetes Paternal Grandfather    Anxiety disorder Daughter    Depression Daughter    Asthma Daughter    Asthma Son    Asthma Son     Social History Social History   Tobacco Use   Smoking status: Never   Smokeless tobacco: Never  Vaping Use   Vaping status: Never Used  Substance Use Topics   Alcohol use: Not Currently    Comment: rare   Drug use: Never     Allergies   Benzodiazepines, Hydrocodone, Lexapro [escitalopram], Wellbutrin  [bupropion ], Caffeine, and Lactose intolerance (gi)   Review of Systems Review of Systems Per HPI  Physical Exam Triage Vital Signs ED Triage Vitals  Encounter Vitals Group     BP 04/09/24 1242 (!) 143/83     Girls Systolic BP Percentile --      Girls Diastolic BP Percentile --      Boys Systolic BP Percentile --      Boys Diastolic BP Percentile --      Pulse Rate 04/09/24 1242 91     Resp 04/09/24 1242 20     Temp 04/09/24 1242 98.5 F (36.9 C)     Temp Source 04/09/24 1242 Oral     SpO2 04/09/24 1242 92 %     Weight --      Height --      Head Circumference --      Peak Flow --      Pain Score 04/09/24 1245 4     Pain Loc --      Pain Education --      Exclude from Growth Chart --    No data  found.  Updated Vital Signs BP (!) 143/83 (BP Location: Right Arm)   Pulse 91   Temp 98.5 F (36.9 C) (Oral)   Resp 20   SpO2 92%   Visual Acuity Right Eye Distance:   Left Eye Distance:   Bilateral Distance:    Right Eye Near:   Left Eye Near:    Bilateral Near:     Physical Exam Vitals and nursing note reviewed.  Constitutional:      Appearance: Normal appearance.  HENT:     Head: Atraumatic.     Right Ear: Tympanic membrane and external ear normal.     Left Ear: Tympanic membrane and external ear normal.     Nose: Rhinorrhea present.     Mouth/Throat:     Mouth: Mucous membranes are moist.     Pharynx: Posterior oropharyngeal erythema present.  Eyes:     Extraocular Movements: Extraocular movements intact.     Conjunctiva/sclera: Conjunctivae normal.  Cardiovascular:     Rate and Rhythm: Normal rate and regular rhythm.     Heart sounds: Normal heart sounds.  Pulmonary:     Effort: Pulmonary effort is normal.     Breath sounds: Normal breath sounds. No wheezing.  Musculoskeletal:        General: Normal range of motion.     Cervical back: Normal range of motion and neck supple.  Skin:    General: Skin is warm and dry.  Neurological:     Mental Status: She is  alert and oriented to person, place, and time.  Psychiatric:        Mood and Affect: Mood normal.        Thought Content: Thought content normal.      UC Treatments / Results  Labs (all labs ordered are listed, but only abnormal results are displayed) Labs Reviewed - No data to display  EKG   Radiology No results found.  Procedures Procedures (including critical care time)  Medications Ordered in UC Medications - No data to display  Initial Impression / Assessment and Plan / UC Course  I have reviewed the triage vital signs and the nursing notes.  Pertinent labs & imaging results that were available during my care of the patient were reviewed by me and considered in my medical decision  making (see chart for details).     Vitals and exam reassuring today, suspect viral respiratory infection.  Patient declines viral testing today, will treat with Astelin, Phenergan  DM, supportive over-the-counter medications at home care.  Work note given.  Return for worsening or unresolving symptoms.  Final Clinical Impressions(s) / UC Diagnoses   Final diagnoses:  Viral URI with cough   Discharge Instructions   None    ED Prescriptions     Medication Sig Dispense Auth. Provider   promethazine -dextromethorphan (PROMETHAZINE -DM) 6.25-15 MG/5ML syrup Take 5 mLs by mouth 4 (four) times daily as needed. 100 mL Stuart Vernell Norris, PA-C   azelastine (ASTELIN) 0.1 % nasal spray Place 1 spray into both nostrils 2 (two) times daily. Use in each nostril as directed 30 mL Stuart Vernell Norris, PA-C      PDMP not reviewed this encounter.   Stuart Vernell Norris, PA-C 04/09/24 1358

## 2024-04-09 NOTE — Progress Notes (Signed)
 Subjective:   Patient ID: Shannon Gould, female   DOB: 48 y.o.   MRN: [id number]   HPI Patient states she has developed a lot of pain around her bunions bilateral but its been relatively recent even though she has had deformity.  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  I want to see back again in 1 month may require surgical correction  X-rays indicate that there is elevation of the 1 to intermetatarsal angle bilateral of around 15 degrees

## 2024-04-09 NOTE — Progress Notes (Signed)

## 2024-04-09 NOTE — ED Triage Notes (Signed)
 Pt reports congestion, headache, sore throat, low grade fever  x 3 days. Has done telehealth visit but was told by employer to come be seen.

## 2024-04-22 ENCOUNTER — Other Ambulatory Visit: Payer: Self-pay | Admitting: Physician Assistant

## 2024-04-22 DIAGNOSIS — F411 Generalized anxiety disorder: Secondary | ICD-10-CM

## 2024-04-23 ENCOUNTER — Other Ambulatory Visit (HOSPITAL_BASED_OUTPATIENT_CLINIC_OR_DEPARTMENT_OTHER): Payer: Self-pay

## 2024-04-23 MED ORDER — LORAZEPAM 1 MG PO TABS
1.0000 mg | ORAL_TABLET | Freq: Three times a day (TID) | ORAL | 1 refills | Status: AC | PRN
  Filled 2024-04-23 – 2024-04-28 (×2): qty 60, 10d supply, fill #0

## 2024-04-24 ENCOUNTER — Other Ambulatory Visit (HOSPITAL_BASED_OUTPATIENT_CLINIC_OR_DEPARTMENT_OTHER): Payer: Self-pay

## 2024-04-28 ENCOUNTER — Other Ambulatory Visit (HOSPITAL_BASED_OUTPATIENT_CLINIC_OR_DEPARTMENT_OTHER): Payer: Self-pay

## 2024-05-07 ENCOUNTER — Other Ambulatory Visit (HOSPITAL_BASED_OUTPATIENT_CLINIC_OR_DEPARTMENT_OTHER): Payer: Self-pay

## 2024-05-25 ENCOUNTER — Ambulatory Visit: Admitting: Podiatry

## 2024-05-25 ENCOUNTER — Encounter: Payer: Self-pay | Admitting: Podiatry

## 2024-05-25 DIAGNOSIS — M21619 Bunion of unspecified foot: Secondary | ICD-10-CM | POA: Diagnosis not present

## 2024-05-25 DIAGNOSIS — M7752 Other enthesopathy of left foot: Secondary | ICD-10-CM

## 2024-05-25 DIAGNOSIS — M7751 Other enthesopathy of right foot: Secondary | ICD-10-CM

## 2024-05-25 NOTE — Progress Notes (Signed)
 Subjective:   Patient ID: Shannon Gould, female   DOB: 48 y.o.   MRN: [id number]   HPI Patient states she is getting pain not to the same degree as previously but wanted to know whether surgery is indicated.  Left over right   ROS      Objective:  Physical Exam  Neurovascular status intact with mild prominence around the first metatarsal head left over right foot with fluid buildup around the area     Assessment:  Patient is improving but does have mild discomfort     Plan:  Reviewed patient's x-rays reviewed anti-inflammatories and discussed treatment options.  We are going to hold off on surgery but if symptoms were to get worse over the next few months this will be necessary all questions answered today concerning distal osteotomy and recovery

## 2024-06-12 ENCOUNTER — Ambulatory Visit (INDEPENDENT_AMBULATORY_CARE_PROVIDER_SITE_OTHER): Admitting: Physician Assistant

## 2024-06-15 ENCOUNTER — Other Ambulatory Visit: Payer: Self-pay

## 2024-07-02 ENCOUNTER — Other Ambulatory Visit: Payer: Self-pay

## 2024-07-17 ENCOUNTER — Ambulatory Visit: Admitting: Physician Assistant

## 2024-10-21 ENCOUNTER — Ambulatory Visit: Admitting: Neurology
